# Patient Record
Sex: Female | Born: 1945 | Race: White | Hispanic: No | Marital: Married | Smoking: Never smoker
Health system: Southern US, Community
[De-identification: ages and names within clinical notes are randomized; demographics above are authoritative.]

## PROBLEM LIST (undated history)

## (undated) DIAGNOSIS — E049 Nontoxic goiter, unspecified: Secondary | ICD-10-CM

## (undated) DIAGNOSIS — D649 Anemia, unspecified: Secondary | ICD-10-CM

## (undated) DIAGNOSIS — N189 Chronic kidney disease, unspecified: Secondary | ICD-10-CM

## (undated) DIAGNOSIS — R0789 Other chest pain: Secondary | ICD-10-CM

## (undated) DIAGNOSIS — I10 Essential (primary) hypertension: Secondary | ICD-10-CM

## (undated) DIAGNOSIS — Z973 Presence of spectacles and contact lenses: Secondary | ICD-10-CM

## (undated) DIAGNOSIS — I639 Cerebral infarction, unspecified: Secondary | ICD-10-CM

## (undated) DIAGNOSIS — K209 Esophagitis, unspecified: Secondary | ICD-10-CM

## (undated) DIAGNOSIS — Z8719 Personal history of other diseases of the digestive system: Secondary | ICD-10-CM

## (undated) DIAGNOSIS — E785 Hyperlipidemia, unspecified: Secondary | ICD-10-CM

## (undated) DIAGNOSIS — M4316 Spondylolisthesis, lumbar region: Secondary | ICD-10-CM

## (undated) DIAGNOSIS — I739 Peripheral vascular disease, unspecified: Secondary | ICD-10-CM

## (undated) DIAGNOSIS — E039 Hypothyroidism, unspecified: Secondary | ICD-10-CM

## (undated) DIAGNOSIS — H5410 Blindness, one eye, low vision other eye, unspecified eyes: Secondary | ICD-10-CM

## (undated) DIAGNOSIS — Z87442 Personal history of urinary calculi: Secondary | ICD-10-CM

## (undated) DIAGNOSIS — Z87440 Personal history of urinary (tract) infections: Secondary | ICD-10-CM

## (undated) DIAGNOSIS — M109 Gout, unspecified: Secondary | ICD-10-CM

## (undated) DIAGNOSIS — E119 Type 2 diabetes mellitus without complications: Secondary | ICD-10-CM

## (undated) DIAGNOSIS — K219 Gastro-esophageal reflux disease without esophagitis: Secondary | ICD-10-CM

## (undated) DIAGNOSIS — G629 Polyneuropathy, unspecified: Secondary | ICD-10-CM

## (undated) HISTORY — PX: KNEE SURGERY: SHX244

## (undated) HISTORY — PX: TUMOR REMOVAL: SHX12

## (undated) HISTORY — PX: ABDOMINAL HYSTERECTOMY: SHX81

## (undated) HISTORY — PX: BREAST BIOPSY: SHX20

## (undated) HISTORY — DX: Essential (primary) hypertension: I10

## (undated) HISTORY — PX: TONSILLECTOMY AND ADENOIDECTOMY: SUR1326

## (undated) HISTORY — DX: Hypothyroidism, unspecified: E03.9

## (undated) HISTORY — PX: BUNIONECTOMY: SHX129

## (undated) HISTORY — DX: Gout, unspecified: M10.9

## (undated) HISTORY — PX: APPENDECTOMY: SHX54

## (undated) HISTORY — PX: TUBAL LIGATION: SHX77

## (undated) HISTORY — PX: BACK SURGERY: SHX140

## (undated) HISTORY — DX: Hyperlipidemia, unspecified: E78.5

## (undated) HISTORY — PX: TEMPORAL ARTERY BIOPSY / LIGATION: SUR132

## (undated) HISTORY — DX: Type 2 diabetes mellitus without complications: E11.9

## (undated) HISTORY — DX: Cerebral infarction, unspecified: I63.9

## (undated) HISTORY — DX: Gastro-esophageal reflux disease without esophagitis: K21.9

## (undated) HISTORY — DX: Esophagitis, unspecified: K20.9

## (undated) HISTORY — PX: DILATION AND CURETTAGE OF UTERUS: SHX78

## (undated) HISTORY — DX: Anemia, unspecified: D64.9

---

## 1993-07-08 HISTORY — PX: ABSCESS DRAINAGE: SHX1119

## 1999-02-08 ENCOUNTER — Other Ambulatory Visit: Admission: RE | Admit: 1999-02-08 | Discharge: 1999-02-08 | Payer: Self-pay | Admitting: *Deleted

## 2000-03-11 ENCOUNTER — Other Ambulatory Visit: Admission: RE | Admit: 2000-03-11 | Discharge: 2000-03-11 | Payer: Self-pay | Admitting: *Deleted

## 2000-03-26 ENCOUNTER — Ambulatory Visit (HOSPITAL_COMMUNITY): Admission: RE | Admit: 2000-03-26 | Discharge: 2000-03-26 | Payer: Self-pay | Admitting: Internal Medicine

## 2000-03-26 ENCOUNTER — Encounter: Payer: Self-pay | Admitting: Internal Medicine

## 2000-04-09 ENCOUNTER — Encounter: Payer: Self-pay | Admitting: Internal Medicine

## 2000-04-09 ENCOUNTER — Ambulatory Visit (HOSPITAL_COMMUNITY): Admission: RE | Admit: 2000-04-09 | Discharge: 2000-04-09 | Payer: Self-pay | Admitting: Internal Medicine

## 2001-10-12 ENCOUNTER — Ambulatory Visit (HOSPITAL_COMMUNITY): Admission: RE | Admit: 2001-10-12 | Discharge: 2001-10-12 | Payer: Self-pay | Admitting: Orthopedic Surgery

## 2001-10-12 ENCOUNTER — Encounter: Payer: Self-pay | Admitting: Orthopedic Surgery

## 2003-04-27 ENCOUNTER — Other Ambulatory Visit: Admission: RE | Admit: 2003-04-27 | Discharge: 2003-04-27 | Payer: Self-pay | Admitting: *Deleted

## 2007-05-27 ENCOUNTER — Ambulatory Visit: Payer: Self-pay | Admitting: Vascular Surgery

## 2007-09-17 ENCOUNTER — Encounter: Payer: Self-pay | Admitting: Cardiovascular Disease

## 2010-04-03 ENCOUNTER — Ambulatory Visit: Payer: Self-pay | Admitting: Cardiovascular Disease

## 2010-04-10 ENCOUNTER — Telehealth (INDEPENDENT_AMBULATORY_CARE_PROVIDER_SITE_OTHER): Payer: Self-pay | Admitting: *Deleted

## 2010-04-11 ENCOUNTER — Encounter: Payer: Self-pay | Admitting: Cardiovascular Disease

## 2010-04-11 ENCOUNTER — Encounter (HOSPITAL_COMMUNITY): Admission: RE | Admit: 2010-04-11 | Discharge: 2010-04-23 | Payer: Self-pay | Admitting: Cardiovascular Disease

## 2010-04-11 ENCOUNTER — Ambulatory Visit: Payer: Self-pay | Admitting: Cardiovascular Disease

## 2010-04-11 ENCOUNTER — Ambulatory Visit: Payer: Self-pay

## 2010-07-25 DIAGNOSIS — E1142 Type 2 diabetes mellitus with diabetic polyneuropathy: Secondary | ICD-10-CM | POA: Insufficient documentation

## 2010-08-07 NOTE — Assessment & Plan Note (Signed)
Summary: Cardiology Nuclear Testing  Nuclear Med Background Indications for Stress Test: Evaluation for Ischemia   History: Myocardial Perfusion Study  History Comments: 09 MPS nl  Symptoms: Dizziness, DOE, Palpitations  Symptoms Comments: Neck pain and radiates to arms   Nuclear Pre-Procedure Cardiac Risk Factors: Hypertension, Lipids, NIDDM Caffeine/Decaff Intake: None NPO After: 7:00 PM Lungs: clear IV 0.9% NS with Angio Cath: 22g     IV Site: R Hand IV Started by: Irean Hong, RN Chest Size (in) 36     Cup Size B     Height (in): 63 Weight (lb): 174 BMI: 30.93 Tech Comments: Held atenolol 36 hrs.ago.  Nuclear Med Study 1 or 2 day study:  1 day     Stress Test Type:  Stress Reading MD:  Kristeen Miss, MD     Referring MD:  P.Nahser Resting Radionuclide:  Technetium 48m Tetrofosmin     Resting Radionuclide Dose:  11 mCi  Stress Radionuclide:  Technetium 28m Tetrofosmin     Stress Radionuclide Dose:  33 mCi   Stress Protocol Exercise Time (min):  6:00 min     Max HR:  157 bpm     Predicted Max HR:  154 bpm  Max Systolic BP: 221 mm Hg     Percent Max HR:  101.95 %     METS: 7.0 Rate Pressure Product:  54098    Stress Test Technologist:  Milana Na, EMT-P     Nuclear Technologist:  Domenic Polite, CNMT  Rest Procedure  Myocardial perfusion imaging was performed at rest 45 minutes following the intravenous administration of Technetium 63m Tetrofosmin.  Stress Procedure  The patient exercised for 6:00. The patient stopped due to fatigue, sob,and denied any chest pain.  There were + significant ST-T wave changes and a rare pvc.  Technetium 61m Tetrofosmin was injected at peak exercise and myocardial perfusion imaging was performed after a brief delay.  QPS Raw Data Images:  Normal; no motion artifact; normal heart/lung ratio. Stress Images:  Normal homogeneous uptake in all areas of the myocardium. Rest Images:  Normal homogeneous uptake in all areas of the  myocardium. Subtraction (SDS):  No evidence of ischemia. Transient Ischemic Dilatation:  1.03  (Normal <1.22)  Lung/Heart Ratio:  .43  (Normal <0.45)  Quantitative Gated Spect Images QGS EDV:  68 ml QGS ESV:  16 ml QGS EF:  77 % QGS cine images:  Normal LV function.   Overall Impression  Exercise Capacity: Good exercise capacity. BP Response: Hypertensive blood pressure response. Clinical Symptoms: No chest pain ECG Impression: No significant ST segment change suggestive of ischemia. Overall Impression: Normal stress nuclear study. Overall Impression Comments: No evidence of ischemia. Normal LV function.

## 2010-08-07 NOTE — Progress Notes (Signed)
Summary: nuc pre procedure  Phone Note Outgoing Call Call back at Home Phone [phone]   Call placed by: Cathlyn Parsons RN,  April 10, 2010 2:22 PM Call placed to: Patient Reason for Call: Confirm/change Appt Summary of Call: Reviewed information on Myoview Information Sheet (see scanned document for further details).  Spoke with patient.      Nuclear Med Background Indications for Stress Test: Evaluation for Ischemia   History: Myocardial Perfusion Study  History Comments: 09 MPS nl   Symptoms Comments: Neck pain and radiates to arms   Nuclear Pre-Procedure Cardiac Risk Factors: Hypertension, Lipids, NIDDM

## 2010-08-14 ENCOUNTER — Encounter: Payer: Self-pay | Admitting: Cardiovascular Disease

## 2010-08-14 DIAGNOSIS — E119 Type 2 diabetes mellitus without complications: Secondary | ICD-10-CM | POA: Insufficient documentation

## 2010-08-14 DIAGNOSIS — E785 Hyperlipidemia, unspecified: Secondary | ICD-10-CM | POA: Insufficient documentation

## 2010-08-14 DIAGNOSIS — K219 Gastro-esophageal reflux disease without esophagitis: Secondary | ICD-10-CM | POA: Insufficient documentation

## 2010-08-14 DIAGNOSIS — E039 Hypothyroidism, unspecified: Secondary | ICD-10-CM | POA: Insufficient documentation

## 2010-08-14 DIAGNOSIS — I10 Essential (primary) hypertension: Secondary | ICD-10-CM | POA: Insufficient documentation

## 2010-10-02 ENCOUNTER — Encounter: Payer: Self-pay | Admitting: Cardiovascular Disease

## 2010-10-02 ENCOUNTER — Ambulatory Visit (INDEPENDENT_AMBULATORY_CARE_PROVIDER_SITE_OTHER): Payer: Federal, State, Local not specified - PPO | Admitting: Cardiovascular Disease

## 2010-10-02 DIAGNOSIS — I10 Essential (primary) hypertension: Secondary | ICD-10-CM

## 2010-10-02 DIAGNOSIS — R079 Chest pain, unspecified: Secondary | ICD-10-CM

## 2010-10-02 NOTE — Assessment & Plan Note (Signed)
Her chest pains were very atypical and have completely resolved. She had a normal stress Myoview study. I've recommended that she continue to exercise as much is possible. She will continue to follow up with Dr. Jacky Kindle for hypercholesterolemia and hypertension. I have not scheduled her for a return appointment but would be happy to see her if needed.

## 2010-10-02 NOTE — Progress Notes (Signed)
History of Present Illness: Theresa Shannon is a middle-aged female with history of atypical chest pains. She has a history of diabetes mellitus, hypothyroidism, and hypertension. She has a strong family history of coronary disease. She's feeling quite a bit better since I last saw her in September. She had a day stress Myoview study which was normal. She had no evidence of ischemia and she had normal left ventricular systolic function. She's done quite well since that time. She denies any syncope or presyncope.  Current Outpatient Prescriptions on File Prior to Visit  Medication Sig Dispense Refill  . aspirin 325 MG tablet Take 325 mg by mouth daily.        Marland Kitchen atenolol (TENORMIN) 50 MG tablet Take 50 mg by mouth daily.        . Calcium Carbonate-Vitamin D (CALCIUM 600 + D PO) Take by mouth daily.        . cycloSPORINE (RESTASIS) 0.05 % ophthalmic emulsion 1 drop 2 (two) times daily.        . DHA-EPA-Flaxseed Oil-Vitamin E (THERA TEARS NUTRITION PO) Take by mouth daily.        Marland Kitchen levothyroxine (SYNTHROID, LEVOTHROID) 200 MCG tablet Take 200 mcg by mouth daily.        Marland Kitchen omeprazole (PRILOSEC) 20 MG capsule Take 20 mg by mouth daily.        . Pyridoxine HCl (VITAMIN B-6) 100 MG tablet Take 100 mg by mouth daily.        Marland Kitchen triamterene-hydrochlorothiazide (MAXZIDE-25) 37.5-25 MG per tablet Take 1 tablet by mouth daily.          Allergies  Allergen Reactions  . Oxycodone-Acetaminophen     REACTION: Syncope  . Tylox     Past Medical History  Diagnosis Date  . HTN (hypertension)   . Hyperlipemia   . Diabetes mellitus, type 2   . Hypothyroidism   . GERD (gastroesophageal reflux disease)     Past Surgical History  Procedure Date  . Tonsillectomy and adenoidectomy   . Abdominal hysterectomy   . Knee surgery     BILATERAL  . Bunionectomy     LEFT  . Tumor removal     LEFT ARM  . Temporal artery biopsy / ligation   . Breast biopsy     History  Smoking status  . Never Smoker   Smokeless  tobacco  . Not on file    History  Alcohol Use No    Family History  Problem Relation Age of Onset  . Dementia Father   . Heart disease Father   . Hypertension Father   . Stroke Father   . Dementia Mother   . Kidney disease Mother   . Cancer Mother   . Diabetes Mother     Review of Systems: The patient denies any heat or cold intolerance.  No weight gain or weight loss.  The patient denies headaches or blurry vision.  There is no cough or sputum production.  The patient denies dizziness.  There is no hematuria or hematochezia.  The patient denies any muscle aches or arthritis.  The patient denies any rash.  The patient denies frequent falling or instability.  There is no history of depression or anxiety.  All other systems were reviewed and are negative.  Physical Exam: BP 136/72  Pulse 72  Wt 183 lb (83.008 kg) The patient is alert and oriented x 3.  The mood and affect are normal.  The skin is warm and dry.  Color  is normal.  The HEENT exam reveals that the sclera are nonicteric.  The mucous membranes are moist.  The carotids are 2+ without bruits.  There is no thyromegaly.  There is no JVD.  The lungs are clear.  The chest wall is non tender.  The heart exam reveals a regular rate with a normal S1 and S2.  There are no murmurs, gallops, or rubs.  The PMI is not displaced.   Abdominal exam reveals good bowel sounds.  There is no guarding or rebound.  There is no hepatosplenomegaly or tenderness.  There are no masses.  Exam of the legs reveal no clubbing, cyanosis, or edema.  The legs are without rashes.  The distal pulses are intact.  Cranial nerves II - XII are intact.  Motor and sensory functions are intact.  The gait is normal.  Assessment / Plan:

## 2010-10-02 NOTE — Assessment & Plan Note (Signed)
Her blood pressure is well controlled. She'll followup with Dr. Jacky Kindle.

## 2010-10-30 ENCOUNTER — Encounter: Payer: Self-pay | Admitting: Cardiovascular Disease

## 2010-11-20 NOTE — Procedures (Signed)
CAROTID DUPLEX EXAM   INDICATION:  Right retinal artery occlusion.   HISTORY:  Diabetes:  No.  Cardiac:  No.  Hypertension:  Yes.  Smoking:  No.  Previous Surgery:  No.  CV History:  No.  Amaurosis Fugax No, Paresthesias No, Hemiparesis No                                       RIGHT             LEFT  Brachial systolic pressure:         130               140  Brachial Doppler waveforms:         Biphasic          Biphasic  Vertebral direction of flow:        Antegrade         Antegrade  DUPLEX VELOCITIES (cm/sec)  CCA peak systolic                   120               119  ECA peak systolic                   105               106  ICA peak systolic                   57                69  ICA end diastolic                   24                32  PLAQUE MORPHOLOGY:                  None              None  PLAQUE AMOUNT:                      None              None  PLAQUE LOCATION:                    None              None   IMPRESSION:  Normal carotid duplex noted in bilateral ICAs.  Antegrade  bilateral vertebral arteries.   ___________________________________________  Larina Earthly, M.D.   MG/MEDQ  D:  05/27/2007  T:  05/28/2007  Job:  [id number]

## 2012-01-13 ENCOUNTER — Ambulatory Visit
Admission: RE | Admit: 2012-01-13 | Discharge: 2012-01-13 | Disposition: A | Discharge status: Home / Self Care | Payer: Federal, State, Local not specified - PPO | Source: Ambulatory Visit | Attending: Internal Medicine | Admitting: Internal Medicine

## 2012-01-13 ENCOUNTER — Other Ambulatory Visit: Payer: Self-pay | Admitting: Internal Medicine

## 2012-01-13 DIAGNOSIS — N611 Abscess of the breast and nipple: Secondary | ICD-10-CM

## 2012-01-13 DIAGNOSIS — Z803 Family history of malignant neoplasm of breast: Secondary | ICD-10-CM

## 2012-01-16 LAB — CULTURE, ROUTINE-ABSCESS

## 2012-01-27 ENCOUNTER — Ambulatory Visit
Admission: RE | Admit: 2012-01-27 | Discharge: 2012-01-27 | Disposition: A | Discharge status: Home / Self Care | Payer: Federal, State, Local not specified - PPO | Source: Ambulatory Visit | Attending: Internal Medicine | Admitting: Internal Medicine

## 2012-01-27 ENCOUNTER — Encounter (INDEPENDENT_AMBULATORY_CARE_PROVIDER_SITE_OTHER): Payer: Self-pay | Admitting: Surgery

## 2012-01-27 ENCOUNTER — Ambulatory Visit (INDEPENDENT_AMBULATORY_CARE_PROVIDER_SITE_OTHER): Payer: Federal, State, Local not specified - PPO | Admitting: Surgery

## 2012-01-27 VITALS — BP 110/82 | HR 60 | Temp 97.7°F | Resp 12 | Ht 63.0 in | Wt 191.0 lb

## 2012-01-27 DIAGNOSIS — N611 Abscess of the breast and nipple: Secondary | ICD-10-CM

## 2012-01-27 DIAGNOSIS — L723 Sebaceous cyst: Secondary | ICD-10-CM

## 2012-01-27 DIAGNOSIS — L089 Local infection of the skin and subcutaneous tissue, unspecified: Secondary | ICD-10-CM

## 2012-01-27 NOTE — Progress Notes (Signed)
Subjective:     Patient ID: Theresa Shannon, female   DOB: June 28, 1946, 66 y.o.   MRN: [id number]  HPI This is a 66 year old female who presents referred by Dr. 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 She actually had incision and drainage of a chronic sebaceous cyst in the same area approximately 18 years ago by Dr. Maple Hudson. This recently recurred in early July. She had an aspiration performed by radiology and has been on antibiotics but the area has recurred or persisted  Review of Systems     Objective:   Physical Exam On exam, she has an infected sebaceous cyst along the intermammary fold of the medial left breast. I prepped the area Betadine, anesthetized with lidocaine, made an incision with a scalpel and drained the sebaceous debris which was infected   I then packed the wound with gauze  an and Assessment:     Infected sebaceous cyst left breast    Plan:     Wound care instructions were given. I renewed her doxycycline. I will see her back in one week

## 2012-02-05 ENCOUNTER — Ambulatory Visit (INDEPENDENT_AMBULATORY_CARE_PROVIDER_SITE_OTHER): Payer: Federal, State, Local not specified - PPO | Admitting: Surgery

## 2012-02-05 ENCOUNTER — Encounter (INDEPENDENT_AMBULATORY_CARE_PROVIDER_SITE_OTHER): Payer: Self-pay | Admitting: Surgery

## 2012-02-05 VITALS — BP 129/78 | HR 72 | Temp 98.6°F | Resp 14 | Ht 63.0 in | Wt 188.6 lb

## 2012-02-05 DIAGNOSIS — Z09 Encounter for follow-up examination after completed treatment for conditions other than malignant neoplasm: Secondary | ICD-10-CM

## 2012-02-05 NOTE — Progress Notes (Signed)
Subjective:     Patient ID: Theresa Shannon, female   DOB: 1945/10/11, 66 y.o.   MRN: [id number]  HPI She is here for a followup visit status post incision and drainage of infected sebaceous cyst on her breast. She reports that she is doing well and has no complaints.  Review of Systems     Objective:   Physical Exam On exam, the incision the left breast is well-healed.    Assessment:     Status post incision of infected sebaceous cyst of the right breast    Plan:      As this is now her second infection of a sebaceous cyst the left breast, for removal of the area is recommended. She is eager to proceed with this. I discussed the risks of surgery with her. Surgery will be scheduled.

## 2012-02-14 ENCOUNTER — Encounter (HOSPITAL_BASED_OUTPATIENT_CLINIC_OR_DEPARTMENT_OTHER): Payer: Self-pay | Admitting: *Deleted

## 2012-02-14 NOTE — Progress Notes (Signed)
Pt going on [place]  trip-did hx early To come in for ekg-bmet when she gets back

## 2012-02-24 ENCOUNTER — Other Ambulatory Visit: Payer: Self-pay

## 2012-02-24 ENCOUNTER — Encounter (HOSPITAL_BASED_OUTPATIENT_CLINIC_OR_DEPARTMENT_OTHER)
Admission: RE | Admit: 2012-02-24 | Discharge: 2012-02-24 | Disposition: A | Discharge status: Home / Self Care | Payer: Federal, State, Local not specified - PPO | Source: Ambulatory Visit | Attending: Surgery | Admitting: Surgery

## 2012-02-24 LAB — BASIC METABOLIC PANEL
Chloride: 103 mEq/L (ref 96–112)
GFR calc Af Amer: 65 mL/min — ABNORMAL LOW (ref >90)
Potassium: 3.7 mEq/L (ref 3.5–5.1)
Sodium: 140 mEq/L (ref 135–145)

## 2012-02-24 NOTE — Progress Notes (Signed)
Dr fitzgerald reviewed ekg cleared

## 2012-02-26 ENCOUNTER — Encounter (HOSPITAL_BASED_OUTPATIENT_CLINIC_OR_DEPARTMENT_OTHER): Payer: Self-pay | Admitting: Anesthesiology

## 2012-02-26 ENCOUNTER — Encounter (HOSPITAL_BASED_OUTPATIENT_CLINIC_OR_DEPARTMENT_OTHER)
Admission: RE | Disposition: A | Discharge status: Home / Self Care | Payer: Self-pay | Source: Ambulatory Visit | Attending: Surgery

## 2012-02-26 ENCOUNTER — Ambulatory Visit (HOSPITAL_BASED_OUTPATIENT_CLINIC_OR_DEPARTMENT_OTHER): Payer: Federal, State, Local not specified - PPO | Admitting: Anesthesiology

## 2012-02-26 ENCOUNTER — Encounter (HOSPITAL_BASED_OUTPATIENT_CLINIC_OR_DEPARTMENT_OTHER): Payer: Self-pay | Admitting: *Deleted

## 2012-02-26 ENCOUNTER — Ambulatory Visit (HOSPITAL_BASED_OUTPATIENT_CLINIC_OR_DEPARTMENT_OTHER)
Admission: RE | Admit: 2012-02-26 | Discharge: 2012-02-26 | Disposition: A | Discharge status: Home / Self Care | Payer: Federal, State, Local not specified - PPO | Source: Ambulatory Visit | Attending: Surgery | Admitting: Surgery

## 2012-02-26 DIAGNOSIS — E039 Hypothyroidism, unspecified: Secondary | ICD-10-CM | POA: Insufficient documentation

## 2012-02-26 DIAGNOSIS — E119 Type 2 diabetes mellitus without complications: Secondary | ICD-10-CM | POA: Insufficient documentation

## 2012-02-26 DIAGNOSIS — Z01810 Encounter for preprocedural cardiovascular examination: Secondary | ICD-10-CM | POA: Insufficient documentation

## 2012-02-26 DIAGNOSIS — L723 Sebaceous cyst: Secondary | ICD-10-CM

## 2012-02-26 DIAGNOSIS — N6089 Other benign mammary dysplasias of unspecified breast: Secondary | ICD-10-CM | POA: Insufficient documentation

## 2012-02-26 DIAGNOSIS — Z01812 Encounter for preprocedural laboratory examination: Secondary | ICD-10-CM | POA: Insufficient documentation

## 2012-02-26 DIAGNOSIS — K219 Gastro-esophageal reflux disease without esophagitis: Secondary | ICD-10-CM | POA: Insufficient documentation

## 2012-02-26 DIAGNOSIS — I10 Essential (primary) hypertension: Secondary | ICD-10-CM | POA: Insufficient documentation

## 2012-02-26 HISTORY — DX: Blindness, one eye, low vision other eye, unspecified eyes: H54.10

## 2012-02-26 HISTORY — PX: BREAST CYST EXCISION: SHX579

## 2012-02-26 HISTORY — DX: Presence of spectacles and contact lenses: Z97.3

## 2012-02-26 HISTORY — DX: Other chest pain: R07.89

## 2012-02-26 LAB — GLUCOSE, CAPILLARY
Glucose-Capillary: 141 mg/dL — ABNORMAL HIGH (ref 70–99)
Glucose-Capillary: 147 mg/dL — ABNORMAL HIGH (ref 70–99)

## 2012-02-26 LAB — POCT HEMOGLOBIN-HEMACUE: Hemoglobin: 11.6 g/dL — ABNORMAL LOW (ref 12.0–15.0)

## 2012-02-26 SURGERY — EXCISION, CYST, BREAST
Anesthesia: Monitor Anesthesia Care | Site: Breast | Laterality: Left | Wound class: Clean

## 2012-02-26 MED ORDER — LIDOCAINE HCL (CARDIAC) 20 MG/ML IV SOLN
INTRAVENOUS | Status: DC | PRN
  Administered 2012-02-26: 50 mg via INTRAVENOUS

## 2012-02-26 MED ORDER — CEFAZOLIN SODIUM-DEXTROSE 2-3 GM-% IV SOLR
2.0000 g | INTRAVENOUS | Status: AC
  Administered 2012-02-26: 2 g via INTRAVENOUS

## 2012-02-26 MED ORDER — SODIUM CHLORIDE 0.9 % IV SOLN: 250.0000 mL | INTRAVENOUS | Status: DC | PRN

## 2012-02-26 MED ORDER — FENTANYL CITRATE 0.05 MG/ML IJ SOLN
INTRAMUSCULAR | Status: DC | PRN
  Administered 2012-02-26 (×2): 50 ug via INTRAVENOUS

## 2012-02-26 MED ORDER — LACTATED RINGERS IV SOLN
INTRAVENOUS | Status: DC
  Administered 2012-02-26: 10:00:00 via INTRAVENOUS

## 2012-02-26 MED ORDER — ONDANSETRON HCL 4 MG/2ML IJ SOLN
INTRAMUSCULAR | Status: DC | PRN
  Administered 2012-02-26: 4 mg via INTRAVENOUS

## 2012-02-26 MED ORDER — SODIUM CHLORIDE 0.9 % IJ SOLN: 3.0000 mL | INTRAMUSCULAR | Status: DC | PRN

## 2012-02-26 MED ORDER — PROPOFOL 10 MG/ML IV EMUL
INTRAVENOUS | Status: DC | PRN
  Administered 2012-02-26: 100 ug/kg/min via INTRAVENOUS

## 2012-02-26 MED ORDER — MEPERIDINE HCL 25 MG/ML IJ SOLN: 6.2500 mg | INTRAMUSCULAR | Status: DC | PRN

## 2012-02-26 MED ORDER — MORPHINE SULFATE 2 MG/ML IJ SOLN: 2.0000 mg | INTRAMUSCULAR | Status: DC | PRN

## 2012-02-26 MED ORDER — DEXAMETHASONE SODIUM PHOSPHATE 10 MG/ML IJ SOLN
INTRAMUSCULAR | Status: DC | PRN
  Administered 2012-02-26: 5 mg via INTRAVENOUS

## 2012-02-26 MED ORDER — ONDANSETRON HCL 4 MG/2ML IJ SOLN: 4.0000 mg | Freq: Four times a day (QID) | INTRAMUSCULAR | Status: DC | PRN

## 2012-02-26 MED ORDER — PROMETHAZINE HCL 25 MG/ML IJ SOLN: 6.2500 mg | INTRAMUSCULAR | Status: DC | PRN

## 2012-02-26 MED ORDER — MIDAZOLAM HCL 5 MG/5ML IJ SOLN
INTRAMUSCULAR | Status: DC | PRN
  Administered 2012-02-26: 2 mg via INTRAVENOUS

## 2012-02-26 MED ORDER — SODIUM CHLORIDE 0.9 % IJ SOLN: 3.0000 mL | Freq: Two times a day (BID) | INTRAMUSCULAR | Status: DC

## 2012-02-26 MED ORDER — LIDOCAINE HCL (PF) 1 % IJ SOLN
INTRAMUSCULAR | Status: DC | PRN
  Administered 2012-02-26: 10 mL

## 2012-02-26 MED ORDER — MIDAZOLAM HCL 2 MG/2ML IJ SOLN: 0.5000 mg | Freq: Once | INTRAMUSCULAR | Status: DC | PRN

## 2012-02-26 MED ORDER — HYDROCODONE-ACETAMINOPHEN 5-325 MG PO TABS: 1.0000 | ORAL_TABLET | ORAL | Status: AC | PRN

## 2012-02-26 MED ORDER — ACETAMINOPHEN 650 MG RE SUPP: 650.0000 mg | RECTAL | Status: DC | PRN

## 2012-02-26 MED ORDER — FENTANYL CITRATE 0.05 MG/ML IJ SOLN: 25.0000 ug | INTRAMUSCULAR | Status: DC | PRN

## 2012-02-26 MED ORDER — KETOROLAC TROMETHAMINE 30 MG/ML IJ SOLN
INTRAMUSCULAR | Status: DC | PRN
  Administered 2012-02-26: 30 mg via INTRAVENOUS

## 2012-02-26 MED ORDER — ACETAMINOPHEN 325 MG PO TABS: 650.0000 mg | ORAL_TABLET | ORAL | Status: DC | PRN

## 2012-02-26 SURGICAL SUPPLY — 43 items
BENZOIN TINCTURE PRP APPL 2/3 (GAUZE/BANDAGES/DRESSINGS) IMPLANT
BLADE HEX COATED 2.75 (ELECTRODE) ×2 IMPLANT
BLADE SURG 15 STRL LF DISP TIS (BLADE) ×1 IMPLANT
BLADE SURG 15 STRL SS (BLADE) ×1
CANISTER SUCTION 1200CC (MISCELLANEOUS) IMPLANT
CHLORAPREP W/TINT 26ML (MISCELLANEOUS) ×2 IMPLANT
CLIP TI WIDE RED SMALL 6 (CLIP) IMPLANT
CLOTH BEACON ORANGE TIMEOUT ST (SAFETY) ×2 IMPLANT
COVER MAYO STAND STRL (DRAPES) ×2 IMPLANT
COVER TABLE BACK 60X90 (DRAPES) ×2 IMPLANT
DECANTER SPIKE VIAL GLASS SM (MISCELLANEOUS) IMPLANT
DERMABOND ADVANCED (GAUZE/BANDAGES/DRESSINGS) ×1
DERMABOND ADVANCED .7 DNX12 (GAUZE/BANDAGES/DRESSINGS) ×1 IMPLANT
DEVICE DUBIN W/COMP PLATE 8390 (MISCELLANEOUS) IMPLANT
DRAPE PED LAPAROTOMY (DRAPES) ×2 IMPLANT
DRAPE UTILITY XL STRL (DRAPES) ×2 IMPLANT
DRSG TEGADERM 4X4.75 (GAUZE/BANDAGES/DRESSINGS) ×2 IMPLANT
ELECT REM PT RETURN 9FT ADLT (ELECTROSURGICAL) ×2
ELECTRODE REM PT RTRN 9FT ADLT (ELECTROSURGICAL) ×1 IMPLANT
GAUZE SPONGE 4X4 12PLY STRL LF (GAUZE/BANDAGES/DRESSINGS) ×2 IMPLANT
GLOVE BIO SURGEON STRL SZ7 (GLOVE) ×2 IMPLANT
GLOVE SURG SIGNA 7.5 PF LTX (GLOVE) ×2 IMPLANT
GOWN PREVENTION PLUS XLARGE (GOWN DISPOSABLE) IMPLANT
GOWN PREVENTION PLUS XXLARGE (GOWN DISPOSABLE) ×4 IMPLANT
KIT MARKER MARGIN INK (KITS) IMPLANT
NEEDLE HYPO 25X1 1.5 SAFETY (NEEDLE) ×2 IMPLANT
NS IRRIG 1000ML POUR BTL (IV SOLUTION) ×2 IMPLANT
PACK BASIN DAY SURGERY FS (CUSTOM PROCEDURE TRAY) ×2 IMPLANT
PENCIL BUTTON HOLSTER BLD 10FT (ELECTRODE) ×2 IMPLANT
SLEEVE SCD COMPRESS KNEE MED (MISCELLANEOUS) IMPLANT
SPONGE GAUZE 4X4 12PLY (GAUZE/BANDAGES/DRESSINGS) ×2 IMPLANT
SPONGE LAP 4X18 X RAY DECT (DISPOSABLE) ×2 IMPLANT
STRIP CLOSURE SKIN 1/2X4 (GAUZE/BANDAGES/DRESSINGS) ×2 IMPLANT
SUT MNCRL AB 4-0 PS2 18 (SUTURE) ×2 IMPLANT
SUT SILK 2 0 SH (SUTURE) IMPLANT
SUT VIC AB 3-0 SH 27 (SUTURE) ×1
SUT VIC AB 3-0 SH 27X BRD (SUTURE) ×1 IMPLANT
SYR CONTROL 10ML LL (SYRINGE) ×2 IMPLANT
TOWEL OR 17X24 6PK STRL BLUE (TOWEL DISPOSABLE) ×2 IMPLANT
TOWEL OR NON WOVEN STRL DISP B (DISPOSABLE) IMPLANT
TUBE CONNECTING 20X1/4 (TUBING) IMPLANT
WATER STERILE IRR 1000ML POUR (IV SOLUTION) IMPLANT
YANKAUER SUCT BULB TIP NO VENT (SUCTIONS) IMPLANT

## 2012-02-26 NOTE — Op Note (Signed)
CYST EXCISION BREAST  Procedure Note  Theresa Shannon 02/26/2012   Pre-op Diagnosis: chronic cyst left breast     Post-op Diagnosis: same  Procedure(s): 4 cm wide excision Left breast cyst  Surgeon(s): Shelly Rubenstein, MD  Anesthesia: Monitor Anesthesia Care  Staff:  Chrystine Oiler, RN - Circulator Macie Burows Ricks, RN - Scrub Person  Estimated Blood Loss: Minimal               Specimen:  Sent to path         Procedure: The patient was brought to the operating room and identified the correct patient. She was placed supine on the operating room table and anesthesia was induced. The chronic draining cyst was located at the 7:00 position along the inframammary crease. I anesthetized the skin with lidocaine. I made a 4 cm elliptical incision around the chronic cyst with a scalpel and took this down to the breast tissue with a scalpel. I then completed a wide excision with the electrocautery. The specimen was removed in its entirety and sent to pathology for evaluation. Hemostasis was achieved with the cautery. Anesthetized when further with lidocaine. I irrigated with saline. I then closed subcutaneous tissue with interrupted 3-0 Vicryl's and closed the skin with running 4-0 Monocryl. Dermabond, Steri-Strips, gauze, and Tegaderm were then applied.  The patient tolerated the procedure well. All counts were correct at the end of the procedure. The patient was then extubated in the operating room and taken in a stable condition to the recovery room. Kristine Tiley A   Date: 02/26/2012  Time: 10:01 AM

## 2012-02-26 NOTE — Transfer of Care (Signed)
Immediate Anesthesia Transfer of Care Note  Patient: Theresa Shannon  Procedure(s) Performed: Procedure(s) (LRB): CYST EXCISION BREAST (Left)  Patient Location: PACU  Anesthesia Type: MAC  Level of Consciousness: awake and alert   Airway & Oxygen Therapy: Patient Spontanous Breathing and Patient connected to face mask oxygen  Post-op Assessment: Report given to PACU RN and Post -op Vital signs reviewed and stable  Post vital signs: Reviewed and stable  Complications: No apparent anesthesia complications

## 2012-02-26 NOTE — Anesthesia Procedure Notes (Signed)
Procedure Name: MAC Date/Time: 02/26/2012 9:40 AM Performed by: Caren Macadam Pre-anesthesia Checklist: Patient identified, Emergency Drugs available, Suction available and Patient being monitored Patient Re-evaluated:Patient Re-evaluated prior to inductionOxygen Delivery Method: Simple face mask Preoxygenation: Pre-oxygenation with 100% oxygen Intubation Type: IV induction

## 2012-02-26 NOTE — Anesthesia Postprocedure Evaluation (Signed)
  Anesthesia Post-op Note  Patient: Theresa Shannon  Procedure(s) Performed: Procedure(s) (LRB): CYST EXCISION BREAST (Left)  Patient Location: PACU  Anesthesia Type: MAC  Level of Consciousness: awake, alert  and oriented  Airway and Oxygen Therapy: Patient Spontanous Breathing  Post-op Pain: none  Post-op Assessment: Post-op Vital signs reviewed, Patient's Cardiovascular Status Stable, Respiratory Function Stable, Patent Airway, No signs of Nausea or vomiting and Pain level controlled  Post-op Vital Signs: Reviewed and stable  Complications: No apparent anesthesia complications

## 2012-02-26 NOTE — Anesthesia Preprocedure Evaluation (Addendum)
Anesthesia Evaluation  Patient identified by MRN, date of birth, ID band Patient awake    Reviewed: Allergy & Precautions, H&P , NPO status , Patient's Chart, lab work & pertinent test results  History of Anesthesia Complications Negative for: history of anesthetic complications  Airway Mallampati: II TM Distance: >3 FB Neck ROM: Full    Dental No notable dental hx. (+) Teeth Intact and Dental Advisory Given   Pulmonary neg pulmonary ROS,  breath sounds clear to auscultation  Pulmonary exam normal       Cardiovascular hypertension, Pt. on medications and Pt. on home beta blockers Rhythm:Regular Rate:Normal  '12 stress myoview: no ischemia, normal LVF   Neuro/Psych negative neurological ROS     GI/Hepatic Neg liver ROS, GERD-  Medicated and Controlled,  Endo/Other  Well Controlled, Type 2Hypothyroidism (on replacement) Morbid obesity  Renal/GU negative Renal ROS     Musculoskeletal   Abdominal (+) + obese,   Peds  Hematology   Anesthesia Other Findings   Reproductive/Obstetrics                           Anesthesia Physical Anesthesia Plan  ASA: II  Anesthesia Plan: MAC   Post-op Pain Management:    Induction: Intravenous  Airway Management Planned: Simple Face Mask  Additional Equipment:   Intra-op Plan:   Post-operative Plan:   Informed Consent: I have reviewed the patients History and Physical, chart, labs and discussed the procedure including the risks, benefits and alternatives for the proposed anesthesia with the patient or authorized representative who has indicated his/her understanding and acceptance.   Dental advisory given  Plan Discussed with: CRNA and Surgeon  Anesthesia Plan Comments: (Plan routine monitors, MAC)        Anesthesia Quick Evaluation

## 2012-02-26 NOTE — Interval H&P Note (Signed)
History and Physical Interval Note:  No change  02/26/2012 9:08 AM  Theresa Shannon  has presented today for surgery, with the diagnosis of chronic cyst left breast  The various methods of treatment have been discussed with the patient and family. After consideration of risks, benefits and other options for treatment, the patient has consented to  Procedure(s) (LRB): CYST EXCISION BREAST (Left) as a surgical intervention .  The patient's history has been reviewed, patient examined, no change in status, stable for surgery.  I have reviewed the patient's chart and labs.  Questions were answered to the patient's satisfaction.     Harman Ferrin A

## 2012-02-26 NOTE — H&P (Signed)
Theresa Shannon is an 66 y.o. female.   Chief Complaint: chronic left breast cyst HPI: 66 yr old female with chronic left breast sebaceous cyst s/p infections and I and D twice.  Now presents for definitive excision  Past Medical History  Diagnosis Date  . HTN (hypertension)   . Hyperlipemia   . Hypothyroidism   . GERD (gastroesophageal reflux disease)   . Anemia   . 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 Diabetes mellitus, type 2     diet controlled  . Wears glasses   . Atypical chest pain     stress test 1/12-normal    Past Surgical History  Procedure Date  . Tonsillectomy and adenoidectomy   . Abdominal hysterectomy   . Knee surgery     BILATERAL  . Bunionectomy     LEFT  . Tumor removal     LEFT ARM-lipoma  . Temporal artery biopsy / ligation   . Abscess drainage 1995    breast  . Breast biopsy     Family History  Problem Relation Age of Onset  . Dementia Father   . Heart disease Father   . Hypertension Father   . Stroke Father   . Dementia Mother   . Kidney disease Mother   . Cancer Mother   . Diabetes Mother    Social History:  reports that she has never smoked. She does not have any smokeless tobacco history on file. She reports that she does not drink alcohol or use illicit drugs.  Allergies:  Allergies  Allergen Reactions  . Oxycodone-Acetaminophen     REACTION: Syncope  . Oxycodone-Acetaminophen     No prescriptions prior to admission    Results for orders placed during the hospital encounter of 02/26/12 (from the past 48 hour(s))  BASIC METABOLIC PANEL     Status: Abnormal   Collection Time   02/24/12 10:30 AM      Component Value Range Comment   Sodium 140  135 - 145 mEq/L    Potassium 3.7  3.5 - 5.1 mEq/L    Chloride 103  96 - 112 mEq/L    CO2 25  19 - 32 mEq/L    Glucose, Bld 158 (*) 70 - 99 mg/dL    BUN 23  6 - 23 mg/dL    Creatinine, Ser 1.32  0.50 - 1.10 mg/dL    Calcium 9.5  8.4 -  10.5 mg/dL    GFR calc non Af Amer 56 (*) >90 mL/min    GFR calc Af Amer 65 (*) >90 mL/min    No results found.  Review of Systems  All other systems reviewed and are negative.    Height 5\' 3"  (1.6 m), weight 188 lb (85.276 kg). Physical Exam  Constitutional: She is oriented to person, place, and time. She appears well-developed and well-nourished. No distress.  HENT:  Head: Normocephalic and atraumatic.  Right Ear: External ear normal.  Left Ear: External ear normal.  Nose: Nose normal.  Mouth/Throat: Oropharynx is clear and moist.  Eyes: Conjunctivae are normal. Pupils are equal, round, and reactive to light. No scleral icterus.  Neck: Normal range of motion. Neck supple.  Cardiovascular: Normal rate, regular rhythm, normal heart sounds and intact distal pulses.   No murmur heard. Respiratory: Effort normal and breath sounds normal. No respiratory distress. She has no wheezes.  Neurological: She is alert  and oriented to person, place, and time.  Skin: Skin is warm and dry.  Psychiatric: Her behavior is normal. Judgment normal.   breast:  Scar and cyst left breast approx 7 o'clock.  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  Risks of surgery include but are not limited to bleeding, infection, injury, need for further surgery, recurrence, etc.  She agrees to proceed.  Likelihood of success is good  Quantae Martel A 02/26/2012, 6:23 AM

## 2012-03-03 ENCOUNTER — Encounter (HOSPITAL_BASED_OUTPATIENT_CLINIC_OR_DEPARTMENT_OTHER): Payer: Self-pay | Admitting: Surgery

## 2012-03-11 ENCOUNTER — Encounter (INDEPENDENT_AMBULATORY_CARE_PROVIDER_SITE_OTHER): Payer: Self-pay | Admitting: Surgery

## 2012-03-11 ENCOUNTER — Ambulatory Visit (INDEPENDENT_AMBULATORY_CARE_PROVIDER_SITE_OTHER): Payer: Federal, State, Local not specified - PPO | Admitting: Surgery

## 2012-03-11 VITALS — BP 146/70 | HR 60 | Temp 98.2°F | Ht 63.0 in | Wt 188.6 lb

## 2012-03-11 DIAGNOSIS — Z09 Encounter for follow-up examination after completed treatment for conditions other than malignant neoplasm: Secondary | ICD-10-CM

## 2012-03-11 NOTE — Progress Notes (Signed)
Subjective:     Patient ID: Theresa Shannon, female   DOB: April 21, 1946, 66 y.o.   MRN: [id number]  HPI She is here for her first postoperative visit status post excision of a chronic left breast cyst She is doing well and has no complaints Review of Systems     Objective:   Physical Exam    the final pathology showed a chronic cyst with no evidence of malignancy Assessment:     Patient stable status post excision of chronic left breast cyst    Plan:     She may return to normal activity. I will see her back as needed

## 2012-06-11 ENCOUNTER — Other Ambulatory Visit: Payer: Self-pay | Admitting: Dermatology

## 2013-03-22 ENCOUNTER — Other Ambulatory Visit: Payer: Self-pay | Admitting: Nurse Practitioner

## 2013-04-12 ENCOUNTER — Other Ambulatory Visit: Payer: Self-pay | Admitting: Gastroenterology

## 2013-04-29 ENCOUNTER — Encounter: Payer: Self-pay | Admitting: Nurse Practitioner

## 2013-04-29 ENCOUNTER — Ambulatory Visit (INDEPENDENT_AMBULATORY_CARE_PROVIDER_SITE_OTHER): Payer: Federal, State, Local not specified - PPO | Admitting: Nurse Practitioner

## 2013-04-29 VITALS — BP 142/80 | HR 54 | Ht 63.5 in | Wt 190.0 lb

## 2013-04-29 DIAGNOSIS — R51 Headache: Secondary | ICD-10-CM

## 2013-04-29 DIAGNOSIS — I10 Essential (primary) hypertension: Secondary | ICD-10-CM

## 2013-04-29 MED ORDER — ATENOLOL 50 MG PO TABS: 50.0000 mg | ORAL_TABLET | Freq: Every day | ORAL | Status: DC

## 2013-04-29 NOTE — Patient Instructions (Signed)
Please continue atenolol for headaches  Will renew Rx for one year  Followup yearly and when necessary  

## 2013-04-29 NOTE — Progress Notes (Signed)
[facility]  PATIENT: Theresa Shannon DOB: [birth year]   REASON FOR VISIT: follow up for headache   HISTORY OF PRESENT ILLNESS: Ms. Theresa Shannon, 67 year old female returns for followup She has rare headaches. She is well controlled on atenolol. She has easy bruisability due to aspirin, loss of vision in the right due to her retinal occlusion as stated above,  thyroid disorder currently controlled with medications.  She is exercising by walking and on a diabetic diet. She needs refills.    HISTORY:Ms Theresa Shannon is a 67 year old returns today for followup. She has been evaluated for headaches in the past, she has several different types of headaches. Her headaches date back to January of 2009. 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  She underwent a temporal artery biopsy, began having headaches at that time. She has a second type of headache that is more of a stabbing pain which can occur either in the right temporal area or around the right eye. She is unable to identify any precipitating factors,  she has no associated redness of the eye,  tearing of the eye or running of the nose.  Valproic has not been helpful. She has not had any problems with daytime drowsiness and feels that she has restorative sleep. She has been on Lyrica in the past but did not find it to be beneficial. She has  elevated blood pressures in the past and was started on atenolol,  when last seen her blood pressure continued to be in the 150 range systolically and  atenolol increased to 50 daily.      REVIEW OF SYSTEMS: Full 14 system review of systems performed and notable only for:  Constitutional: N/A  Cardiovascular: N/A  Ear/Nose/Throat: N/A  Skin: N/A  Eyes: N/A  Respiratory: N/A  Gastroitestinal: N/A  Hematology/Lymphatic: N/A  Endocrine: N/A Musculoskeletal:N/A  Allergy/Immunology: N/A    Neurological: N/A Psychiatric: N/A   ALLERGIES: Allergies  Allergen Reactions  . Oxycodone-Acetaminophen     REACTION: Syncope  . Oxycodone-Acetaminophen     HOME MEDICATIONS: Outpatient Prescriptions Prior to Visit  Medication Sig Dispense Refill  . aspirin 325 MG tablet Take 325 mg by mouth daily.        Marland Kitchen atenolol (TENORMIN) 50 MG tablet take 1 tablet by mouth once daily  30 tablet  1  . Calcium Carbonate-Vitamin D (CALCIUM 600 + D PO) Take by mouth daily.        . cycloSPORINE (RESTASIS) 0.05 % ophthalmic emulsion 1 drop 2 (two) times daily.        . DHA-EPA-Flaxseed Oil-Vitamin E (THERA TEARS NUTRITION PO) Take by mouth daily.        Marland Kitchen levothyroxine (SYNTHROID, LEVOTHROID) 200 MCG tablet Take 200 mcg by mouth daily.        Marland Kitchen omeprazole (PRILOSEC) 20 MG capsule Take 20 mg by mouth daily.        Marland Kitchen triamterene-hydrochlorothiazide (MAXZIDE-25) 37.5-25 MG per tablet Take 1 tablet by mouth daily.         No facility-administered medications prior to visit.    PAST MEDICAL HISTORY: Past Medical History  Diagnosis Date  . HTN (hypertension)   . Hyperlipemia   . Hypothyroidism   . GERD (gastroesophageal reflux disease)   . Anemia   . 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 Diabetes mellitus, type 2     diet controlled  . Wears glasses   . Atypical chest pain     stress test 1/12-normal    PAST SURGICAL HISTORY: Past Surgical History  Procedure Laterality Date  . Tonsillectomy and adenoidectomy    . Abdominal hysterectomy    . Knee surgery      BILATERAL  . Bunionectomy      LEFT  . Tumor removal      LEFT ARM-lipoma  . Temporal artery biopsy / ligation    . Abscess drainage  1995    breast  . Breast biopsy    . Breast cyst excision  02/26/2012    Procedure: CYST EXCISION BREAST;  Surgeon: Shelly Rubenstein, MD;  Location: [facility];  Service: General;  Laterality: Left;  excision chronic cyst left  breast    FAMILY HISTORY: Family History  Problem Relation Age of Onset  . Dementia Father   . Heart disease Father   . Hypertension Father   . Stroke Father   . Dementia Mother   . Kidney disease Mother   . Cancer Mother   . Diabetes Mother     SOCIAL HISTORY: History   Social History  . Marital Status: Married    Spouse Name: Theresa Shannon    Number of Children: 1  . Years of Education: 12th   Occupational History  . Retired    Social History Main Topics  . Smoking status: Never Smoker   . Smokeless tobacco: Never Used  . Alcohol Use: No  . Drug Use: No  . Sexual Activity: Not on file   Other Topics Concern  . Not on file   Social History Narrative   Patient lives at home with husband Theresa Shannon.    Patient has 1 child.    Patient is retired.    Patient has a high school education.      PHYSICAL EXAM  Filed Vitals:   04/29/13 1134  BP: 168/72  Pulse: 54  Height: 5' 3.5" (1.613 m)  Weight: 190 lb (86.183 kg)   Body mass index is 33.12 kg/(m^2).  Generalized: Well developed, in no acute distress    Neurological examination   Mentation: Alert oriented to time, place, history taking. Follows all commands speech and language fluent  Cranial nerve II-XII: Pupils were equal round reactive to light extraocular movements were full, visual field were full on confrontational test. Facial sensation and strength were normal. hearing was intact to finger rubbing bilaterally. Uvula tongue midline. head turning and shoulder shrug and were normal and symmetric.Tongue protrusion into cheek strength was normal. Motor: normal bulk and tone, full strength in the BUE, BLE, fine finger movements normal, no pronator drift. No focal weakness Coordination: finger-nose-finger, heel-to-shin bilaterally, no dysmetria Reflexes: 1+ upper lower and symmetric Gait and Station: Rising up from seated position without assistance, normal stance,  moderate stride, good arm swing, smooth turning,  able to perform tiptoe, and heel walking without difficulty.   DIAGNOSTIC DATA (LABS, IMAGING, TESTING) - None to review   ASSESSMENT AND PLAN  67 y.o. year old female  has a past medical history of HTN (hypertension); and headaches. Her headaches have responded well to atenolol. She has a prior history of retinal occlusion   Please continue atenolol for headaches Will renew Rx for one year Followup yearly and when necessary Nilda Riggs, [facility], [facility], APRN  [facility] [address], [address]  [place], [place] [address] [phone]

## 2014-01-27 ENCOUNTER — Other Ambulatory Visit: Payer: Self-pay | Admitting: Obstetrics & Gynecology

## 2014-01-31 LAB — CYTOLOGY - PAP

## 2014-02-09 ENCOUNTER — Encounter: Payer: Self-pay | Admitting: Nurse Practitioner

## 2014-04-29 ENCOUNTER — Ambulatory Visit: Payer: Federal, State, Local not specified - PPO | Admitting: Nurse Practitioner

## 2014-05-02 ENCOUNTER — Other Ambulatory Visit: Payer: Self-pay | Admitting: Nurse Practitioner

## 2014-05-02 ENCOUNTER — Ambulatory Visit: Payer: Federal, State, Local not specified - PPO | Admitting: Nurse Practitioner

## 2014-05-02 ENCOUNTER — Encounter: Payer: Self-pay | Admitting: Nurse Practitioner

## 2014-05-02 ENCOUNTER — Ambulatory Visit (INDEPENDENT_AMBULATORY_CARE_PROVIDER_SITE_OTHER): Payer: Federal, State, Local not specified - PPO | Admitting: Nurse Practitioner

## 2014-05-02 VITALS — BP 134/72 | HR 68 | Ht 63.5 in | Wt 194.2 lb

## 2014-05-02 DIAGNOSIS — R519 Headache, unspecified: Secondary | ICD-10-CM

## 2014-05-02 DIAGNOSIS — R51 Headache: Secondary | ICD-10-CM

## 2014-05-02 MED ORDER — ATENOLOL 50 MG PO TABS: 50.0000 mg | ORAL_TABLET | Freq: Every day | ORAL | Status: DC

## 2014-05-02 NOTE — Patient Instructions (Signed)
Please continue atenolol for headaches  Will renew Rx for one year  Followup yearly and when necessary

## 2014-05-02 NOTE — Progress Notes (Signed)
[facility]  PATIENT: Theresa Shannon DOB: 1945-11-01   REASON FOR VISIT: Follow-up for headache   HISTORY OF PRESENT ILLNESS:Theresa Shannon, 68 year old female returns for followup She has rare headaches. She is well controlled on atenolol. 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 She is exercising by walking and on a diabetic diet. She needs refills on her medication. She returns for reevaluation  HISTORY:Theresa Shannon is a 68 year old returns today for followup. She has been evaluated for headaches in the past, she has several different types of headaches. Her headaches date back to January of 2009. 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 She underwent a temporal artery biopsy, began having headaches at that time. She has a second type of headache that is more of a stabbing pain which can occur either in the right temporal area or around the right eye. She is unable to identify any precipitating factors, she has no associated redness of the eye, tearing of the eye or running of the nose. Valproic has not been helpful. She has not had any problems with daytime drowsiness and feels that she has restorative sleep. She has been on Lyrica in the past but did not find it to be beneficial. She has elevated blood pressures in the past and was started on atenolol, when last seen her blood pressure continued to be in the 638 range systolically and atenolol increased to 50 daily.      REVIEW OF SYSTEMS: Full 14 system review of systems performed and notable only for those listed, all others are neg:  Constitutional: N/A  Cardiovascular: N/A  Ear/Nose/Throat: N/A  Skin: N/A  Eyes: N/A  Respiratory: N/A  Gastroitestinal: N/A  Hematology/Lymphatic: N/A  Endocrine:  N/A Musculoskeletal:N/A  Allergy/Immunology: N/A  Neurological: N/A Psychiatric: N/A Sleep : NA   ALLERGIES: Allergies  Allergen Reactions  . Oxycodone-Acetaminophen     REACTION: Syncope  . Oxycodone-Acetaminophen     HOME MEDICATIONS: Outpatient Prescriptions Prior to Visit  Medication Sig Dispense Refill  . aspirin 325 MG tablet Take 325 mg by mouth daily.        Marland Kitchen atenolol (TENORMIN) 50 MG tablet TAKE 1 TABLET BY MOUTH DAILY  30 tablet  0  . Calcium Carbonate-Vitamin D (CALCIUM 600 + D PO) Take by mouth daily.        . cycloSPORINE (RESTASIS) 0.05 % ophthalmic emulsion 1 drop 2 (two) times daily.        . DHA-EPA-Flaxseed Oil-Vitamin E (THERA TEARS NUTRITION PO) Take by mouth daily.        Marland Kitchen levothyroxine (SYNTHROID, LEVOTHROID) 200 MCG tablet Take 200 mcg by mouth daily.        Marland Kitchen omeprazole (PRILOSEC) 20 MG capsule Take 20 mg by mouth daily.        Marland Kitchen triamterene-hydrochlorothiazide (MAXZIDE-25) 37.5-25 MG per tablet Take 1 tablet by mouth daily.         No facility-administered medications prior to visit.    PAST MEDICAL HISTORY: Past Medical History  Diagnosis Date  . HTN (hypertension)   . Hyperlipemia   . Hypothyroidism   . GERD (gastroesophageal reflux disease)   . Anemia   . 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 Blindness and low vision     right eye  post cva  . Diabetes mellitus, type 2     diet controlled  . Wears glasses   . Atypical chest pain     stress test 1/12-normal    PAST SURGICAL HISTORY: Past Surgical History  Procedure Laterality Date  . Tonsillectomy and adenoidectomy    . Abdominal hysterectomy    . Knee surgery      BILATERAL  . Bunionectomy      LEFT  . Tumor removal      LEFT ARM-lipoma  . Temporal artery biopsy / ligation    . Abscess drainage  1995    breast  . Breast biopsy    . Breast cyst excision  02/26/2012    Procedure: CYST EXCISION BREAST;  Surgeon: Harl Bowie, MD;  Location: [facility];  Service:  General;  Laterality: Left;  excision chronic cyst left breast    FAMILY HISTORY: Family History  Problem Relation Age of Onset  . Dementia Father   . Heart disease Father   . Hypertension Father   . Stroke Father   . Dementia Mother   . Kidney disease Mother   . Cancer Mother   . Diabetes Mother     SOCIAL HISTORY: History   Social History  . Marital Status: Married    Spouse Name: Elenore Rota    Number of Children: 1  . Years of Education: 12th   Occupational History  . Retired    Social History Main Topics  . Smoking status: Never Smoker   . Smokeless tobacco: Never Used  . Alcohol Use: No  . Drug Use: No  . Sexual Activity: Not on file   Other Topics Concern  . Not on file   Social History Narrative   Patient lives at home with husband Elenore Rota.    Patient has 1 child.    Patient is retired.    Patient has a high school education.      PHYSICAL EXAM  Filed Vitals:   05/02/14 1336  BP: 134/72  Pulse: 68  Height: 5' 3.5" (1.613 m)  Weight: 194 lb 3.2 oz (88.089 kg)   Body mass index is 33.86 kg/(m^2). Generalized: Well developed, in no acute distress  Neurological examination  Mentation: Alert oriented to time, place, history taking. Follows all commands speech and language fluent  Cranial nerve II-XII: Pupils were equal round reactive to light extraocular movements were full, visual field were full on confrontational test. Facial sensation and strength were normal. hearing was intact to finger rubbing bilaterally. Uvula tongue midline. head turning and shoulder shrug and were normal and symmetric.Tongue protrusion into cheek strength was normal.  Motor: normal bulk and tone, full strength in the BUE, BLE, fine finger movements normal, no pronator drift. No focal weakness  Coordination: finger-nose-finger, heel-to-shin bilaterally, no dysmetria  Reflexes: 1+ upper lower and symmetric  Gait and Station: Rising up from seated position without assistance, normal  stance, moderate stride, good arm swing, smooth turning, able to perform tiptoe, and heel walking without difficulty.       DIAGNOSTIC DATA (LABS, IMAGING, TESTING) -  ASSESSMENT AND PLAN  68 y.o. year old female  has a past medical history of HTN (hypertension); and headaches that have responded to atenolol.  Please continue atenolol for headaches  Will renew Rx for one year, given to patient per request.  Followup yearly and when necessary Dennie Bible, [facility], [facility], APRN  [facility] [address] [place], [place] [address] [phone]

## 2014-08-17 ENCOUNTER — Other Ambulatory Visit: Payer: Self-pay | Admitting: Dermatology

## 2015-03-09 DIAGNOSIS — K209 Esophagitis, unspecified without bleeding: Secondary | ICD-10-CM

## 2015-03-09 HISTORY — DX: Esophagitis, unspecified without bleeding: K20.90

## 2015-03-23 DIAGNOSIS — I129 Hypertensive chronic kidney disease with stage 1 through stage 4 chronic kidney disease, or unspecified chronic kidney disease: Secondary | ICD-10-CM | POA: Insufficient documentation

## 2015-03-23 DIAGNOSIS — E1121 Type 2 diabetes mellitus with diabetic nephropathy: Secondary | ICD-10-CM | POA: Insufficient documentation

## 2015-04-01 ENCOUNTER — Encounter (HOSPITAL_COMMUNITY): Payer: Self-pay

## 2015-04-01 ENCOUNTER — Emergency Department (HOSPITAL_COMMUNITY): Payer: Federal, State, Local not specified - PPO

## 2015-04-01 DIAGNOSIS — Z8673 Personal history of transient ischemic attack (TIA), and cerebral infarction without residual deficits: Secondary | ICD-10-CM | POA: Diagnosis not present

## 2015-04-01 DIAGNOSIS — R079 Chest pain, unspecified: Secondary | ICD-10-CM | POA: Diagnosis not present

## 2015-04-01 DIAGNOSIS — K219 Gastro-esophageal reflux disease without esophagitis: Secondary | ICD-10-CM | POA: Insufficient documentation

## 2015-04-01 DIAGNOSIS — E039 Hypothyroidism, unspecified: Secondary | ICD-10-CM | POA: Insufficient documentation

## 2015-04-01 DIAGNOSIS — Z862 Personal history of diseases of the blood and blood-forming organs and certain disorders involving the immune mechanism: Secondary | ICD-10-CM | POA: Diagnosis not present

## 2015-04-01 DIAGNOSIS — I10 Essential (primary) hypertension: Secondary | ICD-10-CM | POA: Insufficient documentation

## 2015-04-01 DIAGNOSIS — H5441 Blindness, right eye, normal vision left eye: Secondary | ICD-10-CM | POA: Insufficient documentation

## 2015-04-01 DIAGNOSIS — Z79899 Other long term (current) drug therapy: Secondary | ICD-10-CM | POA: Diagnosis not present

## 2015-04-01 DIAGNOSIS — E119 Type 2 diabetes mellitus without complications: Secondary | ICD-10-CM | POA: Diagnosis not present

## 2015-04-01 DIAGNOSIS — Z7982 Long term (current) use of aspirin: Secondary | ICD-10-CM | POA: Diagnosis not present

## 2015-04-01 LAB — BASIC METABOLIC PANEL
ANION GAP: 10 (ref 5–15)
BUN: 23 mg/dL — AB (ref 6–20)
CO2: 27 mmol/L (ref 22–32)
Calcium: 10 mg/dL (ref 8.9–10.3)
Chloride: 103 mmol/L (ref 101–111)
Creatinine, Ser: 1.35 mg/dL — ABNORMAL HIGH (ref 0.44–1.00)
GFR calc Af Amer: 45 mL/min — ABNORMAL LOW (ref >60)
GFR, EST NON AFRICAN AMERICAN: 38 mL/min — AB (ref >60)
GLUCOSE: 112 mg/dL — AB (ref 65–99)
POTASSIUM: 4 mmol/L (ref 3.5–5.1)
Sodium: 140 mmol/L (ref 135–145)

## 2015-04-01 LAB — CBC
HEMATOCRIT: 41.3 % (ref 36.0–46.0)
HEMOGLOBIN: 13.6 g/dL (ref 12.0–15.0)
MCH: 30 pg (ref 26.0–34.0)
MCHC: 32.9 g/dL (ref 30.0–36.0)
MCV: 91.2 fL (ref 78.0–100.0)
Platelets: 193 10*3/uL (ref 150–400)
RBC: 4.53 MIL/uL (ref 3.87–5.11)
RDW: 13.4 % (ref 11.5–15.5)
WBC: 6.8 10*3/uL (ref 4.0–10.5)

## 2015-04-01 LAB — I-STAT TROPONIN, ED: Troponin i, poc: 0.01 ng/mL (ref 0.00–0.08)

## 2015-04-01 NOTE — ED Notes (Signed)
Pt reports stabbing central chest pain onset around 1300 today while she was sitting at the computer. Denies SOB, dizziness. Pain worse with inspiration.

## 2015-04-02 ENCOUNTER — Encounter (HOSPITAL_COMMUNITY): Payer: Self-pay | Admitting: Radiology

## 2015-04-02 ENCOUNTER — Emergency Department (HOSPITAL_COMMUNITY): Payer: Federal, State, Local not specified - PPO

## 2015-04-02 ENCOUNTER — Emergency Department (HOSPITAL_COMMUNITY)
Admission: EM | Admit: 2015-04-02 | Discharge: 2015-04-02 | Disposition: A | Discharge status: Home / Self Care | Payer: Federal, State, Local not specified - PPO | Attending: Emergency Medicine | Admitting: Emergency Medicine

## 2015-04-02 DIAGNOSIS — R079 Chest pain, unspecified: Secondary | ICD-10-CM

## 2015-04-02 LAB — I-STAT TROPONIN, ED: Troponin i, poc: 0 ng/mL (ref 0.00–0.08)

## 2015-04-02 LAB — TROPONIN I

## 2015-04-02 MED ORDER — IOHEXOL 350 MG/ML SOLN
80.0000 mL | Freq: Once | INTRAVENOUS | Status: AC | PRN
  Administered 2015-04-02: 80 mL via INTRAVENOUS

## 2015-04-02 MED ORDER — SODIUM CHLORIDE 0.9 % IV BOLUS (SEPSIS)
1000.0000 mL | Freq: Once | INTRAVENOUS | Status: AC
  Administered 2015-04-02: 1000 mL via INTRAVENOUS

## 2015-04-02 NOTE — ED Provider Notes (Signed)
CSN: [id number]     Arrival date & time 04/01/15  2021 History  This chart was scribed for Theresa Balls, MD by Evelene Croon, ED Scribe. This patient was seen in room D34C/D34C and the patient's care was started 12:19 AM.     Chief Complaint  Patient presents with  . Chest Pain    The history is provided by the patient. No language interpreter was used.     HPI Comments:  Theresa Shannon is a 69 y.o. female with a history of HTN, HLD and DM, who presents to the Emergency Department complaining of intermittent stabbing central CP that ~ 1300 yesterday (03/31/2014). Her symptom started while seated at the computer. Pt notes her symptom is exacerbated with exertion and deep inspiration. She denies SOB, dizziness, diaphoresis and vomiting. No alleviating factors noted. She reports a history of blood clot in her right eye diagnosed in Oct 2008; denies recent travel/surgery and pain/swelling to her BLE. Pt takes ASA daily. She is asymptomatic at this time.   Past Medical History  Diagnosis Date  . HTN (hypertension)   . Hyperlipemia   . Hypothyroidism   . GERD (gastroesophageal reflux disease)   . Anemia   . 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 Diabetes mellitus, type 2     diet controlled  . Wears glasses   . Atypical chest pain     stress test 1/12-normal   Past Surgical History  Procedure Laterality Date  . Tonsillectomy and adenoidectomy    . Abdominal hysterectomy    . Knee surgery      BILATERAL  . Bunionectomy      LEFT  . Tumor removal      LEFT ARM-lipoma  . Temporal artery biopsy / ligation    . Abscess drainage  1995    breast  . Breast biopsy    . Breast cyst excision  02/26/2012    Procedure: CYST EXCISION BREAST;  Surgeon: Harl Bowie, MD;  Location: [facility];  Service: General;  Laterality: Left;  excision chronic cyst left breast   Family History  Problem Relation Age of Onset  . Dementia  Father   . Heart disease Father   . Hypertension Father   . Stroke Father   . Dementia Mother   . Kidney disease Mother   . Cancer Mother   . Diabetes Mother    Social History  Substance Use Topics  . Smoking status: Never Smoker   . Smokeless tobacco: Never Used  . Alcohol Use: No   OB History    No data available     Review of Systems  A complete 10 system review of systems was obtained and all systems are negative except as noted in the HPI and PMH.    Allergies  Oxycodone-acetaminophen  Home Medications   Prior to Admission medications   Medication Sig Start Date End Date Taking? Authorizing Provider  acetaminophen (TYLENOL) 325 MG tablet Take 650 mg by mouth every 6 (six) hours as needed for mild pain.   Yes Historical Provider, MD  aspirin 325 MG tablet Take 325 mg by mouth daily.     Yes Historical Provider, MD  atenolol (TENORMIN) 50 MG tablet Take 1 tablet (50 mg total) by mouth daily. 05/02/14  Yes Dennie Bible, NP  Calcium Carbonate-Vitamin D (CALCIUM 600 + D PO) Take 1 tablet  by mouth daily.    Yes Historical Provider, MD  cycloSPORINE (RESTASIS) 0.05 % ophthalmic emulsion Place 1 drop into both eyes 2 (two) times daily.    Yes Historical Provider, MD  levothyroxine (SYNTHROID, LEVOTHROID) 200 MCG tablet Take 200 mcg by mouth daily.     Yes Historical Provider, MD  omeprazole (PRILOSEC) 20 MG capsule Take 20 mg by mouth daily as needed (heartburn).    Yes Historical Provider, MD  triamterene-hydrochlorothiazide (MAXZIDE-25) 37.5-25 MG per tablet Take 1 tablet by mouth daily.     Yes Historical Provider, MD   BP 132/53 mmHg  Pulse 70  Temp(Src) 98.1 F (36.7 C) (Oral)  Resp 23  Ht 5\' 3"  (1.6 m)  Wt 183 lb (83.008 kg)  BMI 32.43 kg/m2  SpO2 98% Physical Exam  Constitutional: She is oriented to person, place, and time. She appears well-developed and well-nourished. No distress.  HENT:  Head: Normocephalic and atraumatic.  Nose: Nose normal.   Mouth/Throat: Oropharynx is clear and moist. No oropharyngeal exudate.  Eyes: Conjunctivae and EOM are normal. Pupils are equal, round, and reactive to light. No scleral icterus.  Neck: Normal range of motion. Neck supple. No JVD present. No tracheal deviation present. No thyromegaly present.  Cardiovascular: Normal rate, regular rhythm and normal heart sounds.  Exam reveals no gallop and no friction rub.   No murmur heard. Pulmonary/Chest: Effort normal and breath sounds normal. No respiratory distress. She has no wheezes. She exhibits no tenderness.  Abdominal: Soft. Bowel sounds are normal. She exhibits no distension and no mass. There is no tenderness. There is no rebound and no guarding.  Musculoskeletal: Normal range of motion. She exhibits no edema or tenderness.  Lymphadenopathy:    She has no cervical adenopathy.  Neurological: She is alert and oriented to person, place, and time. No cranial nerve deficit. She exhibits normal muscle tone.  Skin: Skin is warm and dry. No rash noted. No erythema. No pallor.  Nursing note and vitals reviewed.   ED Course  Procedures   DIAGNOSTIC STUDIES:  Oxygen Saturation is 98% on RA, normal by my interpretation.    COORDINATION OF CARE:  12:25 AM Discussed treatment plan with pt at bedside and pt agreed to plan.  Labs Review Labs Reviewed  BASIC METABOLIC PANEL - Abnormal; Notable for the following:    Glucose, Bld 112 (*)    BUN 23 (*)    Creatinine, Ser 1.35 (*)    GFR calc non Af Amer 38 (*)    GFR calc Af Amer 45 (*)    All other components within normal limits  CBC  TROPONIN I  I-STAT TROPOININ, ED  I-STAT TROPOININ, ED    Imaging Review Dg Chest 2 View  04/01/2015   CLINICAL DATA:  69 year old female with centralized chest pain that radiates to the right side of the body since earlier today.  EXAM: CHEST  2 VIEW  COMPARISON:  No priors.  FINDINGS: Lung volumes are normal. No consolidative airspace disease. No pleural  effusions. Mild diffuse peribronchial cuffing, most severe in the region of the right middle lobe medially. No evidence of pulmonary edema. Heart size is borderline enlarged. Upper mediastinal contours are within normal limits.  IMPRESSION: 1. Mild diffuse peribronchial cuffing, most severe in the right middle lobe, concerning for bronchitis.   Electronically Signed   By: Vinnie Langton M.D.   On: 04/01/2015 21:20   Ct Angio Chest Pe W/cm &/or Wo Cm  04/02/2015   CLINICAL DATA:  Shortness of breath. Dizziness. Stabbing chest pain since 1 p.m. today.  EXAM: CT ANGIOGRAPHY CHEST WITH CONTRAST  TECHNIQUE: Multidetector CT imaging of the chest was performed using the standard protocol during bolus administration of intravenous contrast. Multiplanar CT image reconstructions and MIPs were obtained to evaluate the vascular anatomy.  CONTRAST:  39mL OMNIPAQUE IOHEXOL 350 MG/ML SOLN  COMPARISON:  Chest radiographs obtained earlier today.  FINDINGS: Normally opacified pulmonary arteries. No pulmonary arterial filling defects seen. Patchy interstitial prominence in both lungs. There is also bilateral dependent atelectasis. Diffuse peribronchial thickening. No lung nodules or enlarged lymph nodes. Unremarkable upper abdomen. Thoracic spine degenerative changes.  Review of the MIP images confirms the above findings.  IMPRESSION: 1. No pulmonary emboli. 2. Bilateral interstitial pneumonitis. 3. Bronchitic changes. 4. Bilateral dependent atelectasis.   Electronically Signed   By: Claudie Revering M.D.   On: 04/02/2015 02:46   I have personally reviewed and evaluated these images and lab results as part of my medical decision-making.   EKG Interpretation   Date/Time:  Sunday April 02 2015 00:15:53 EDT Ventricular Rate:  58 PR Interval:  167 QRS Duration: 86 QT Interval:  421 QTC Calculation: 413 R Axis:   36 Text Interpretation:  Sinus rhythm No significant change since last  tracing Confirmed by Glynn Octave [phone]) on 04/02/2015 12:50:00  AM      MDM   Final diagnoses:  None    Patient presents emergency department for chest pain. Her pain is pleuritic, concerning for pulmonary embolism. She has history of blood clot in her eye in the past. Will obtain CT imaging for evaluation. EKG repeated 2 does not show any acute changes for ischemia. Repeat troponin is negative as well.    CT scan is negative for pulmonary embolism. It does reveal mild pneumonitis, these findings were explained to the patient. Primary care follow-up is advised in the next 2 days. She demonstrated understanding. She appears well and in no acute distress, she has had no recurrence of her symptoms. She is currently asymptomatic. Her vital signs remain within her normal limits and she is safe for discharge.  I personally performed the services described in this documentation, which was scribed in my presence. The recorded information has been reviewed and is accurate.    Theresa Balls, MD 04/02/15 [phone]

## 2015-04-02 NOTE — ED Notes (Signed)
Patient transported to CT 

## 2015-04-02 NOTE — Discharge Instructions (Signed)
Chest Pain (Nonspecific) Theresa Shannon, your CT scan results are below. See her primary care physician within 3 days for close follow-up. If symptoms worsen come back to emergency department immediately. Thank you.  FINDINGS: Normally opacified pulmonary arteries. No pulmonary arterial filling defects seen. Patchy interstitial prominence in both lungs. There is also bilateral dependent atelectasis. Diffuse peribronchial thickening. No lung nodules or enlarged lymph nodes. Unremarkable upper abdomen. Thoracic spine degenerative changes.  Review of the MIP images confirms the above findings.  IMPRESSION: 1. No pulmonary emboli. 2. Bilateral interstitial pneumonitis. 3. Bronchitic changes. 4. Bilateral dependent atelectasis.   It is often hard to give a diagnosis for the cause of chest pain. There is always a chance that your pain could be related to something serious, such as a heart attack or a blood clot in the lungs. You need to follow up with your doctor. HOME CARE  If antibiotic medicine was given, take it as directed by your doctor. Finish the medicine even if you start to feel better.  For the next few days, avoid activities that bring on chest pain. Continue physical activities as told by your doctor.  Do not use any tobacco products. This includes cigarettes, chewing tobacco, and e-cigarettes.  Avoid drinking alcohol.  Only take medicine as told by your doctor.  Follow your doctor's suggestions for more testing if your chest pain does not go away.  Keep all doctor visits you made. GET HELP IF:  Your chest pain does not go away, even after treatment.  You have a rash with blisters on your chest.  You have a fever. GET HELP RIGHT AWAY IF:   You have more pain or pain that spreads to your arm, neck, jaw, back, or belly (abdomen).  You have shortness of breath.  You cough more than usual or cough up blood.  You have very bad back or belly pain.  You feel sick to  your stomach (nauseous) or throw up (vomit).  You have very bad weakness.  You pass out (faint).  You have chills. This is an emergency. Do not wait to see if the problems will go away. Call your local emergency services (911 in [place] Do not drive yourself to the hospital. MAKE SURE YOU:   Understand these instructions.  Will watch your condition.  Will get help right away if you are not doing well or get worse. Document Released: 12/11/2007 Document Revised: 06/29/2013 Document Reviewed: 12/11/2007 [facility] Patient Information 2015 [place], [place]. This information is not intended to replace advice given to you by your health care provider. Make sure you discuss any questions you have with your health care provider.

## 2015-04-02 NOTE — ED Notes (Signed)
Dr. Oni at bedside. 

## 2015-04-08 DIAGNOSIS — M109 Gout, unspecified: Secondary | ICD-10-CM

## 2015-04-08 HISTORY — DX: Gout, unspecified: M10.9

## 2015-04-28 ENCOUNTER — Other Ambulatory Visit: Payer: Self-pay | Admitting: Gastroenterology

## 2015-05-03 ENCOUNTER — Encounter (INDEPENDENT_AMBULATORY_CARE_PROVIDER_SITE_OTHER): Payer: Self-pay

## 2015-05-03 ENCOUNTER — Ambulatory Visit (INDEPENDENT_AMBULATORY_CARE_PROVIDER_SITE_OTHER): Payer: Federal, State, Local not specified - PPO | Admitting: Nurse Practitioner

## 2015-05-03 ENCOUNTER — Encounter: Payer: Self-pay | Admitting: Nurse Practitioner

## 2015-05-03 VITALS — BP 147/72 | HR 66 | Ht 63.0 in | Wt 178.2 lb

## 2015-05-03 DIAGNOSIS — R519 Headache, unspecified: Secondary | ICD-10-CM

## 2015-05-03 DIAGNOSIS — R51 Headache: Secondary | ICD-10-CM

## 2015-05-03 MED ORDER — ATENOLOL 50 MG PO TABS: 50.0000 mg | ORAL_TABLET | Freq: Every day | ORAL | Status: DC

## 2015-05-03 NOTE — Patient Instructions (Addendum)
Continue atenolol 50 mg daily for headache management will refill for one year   follow-up yearly.

## 2015-05-03 NOTE — Progress Notes (Signed)
[facility]  PATIENT: Theresa Shannon DOB: 1946-01-09   REASON FOR VISIT: Follow-up for episodic  headaches HISTORY FROM: Patient    HISTORY OF PRESENT ILLNESS:Theresa Shannon, 69 year old female returns for followup She has episodic headaches. She is well controlled on atenolol. 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 She is exercising by walking and on a diabetic diet. She needs refills on her medication. She has had esophagitis and gout since last seen.She returns for reevaluation  HISTORY:Theresa Shannon is a 69 year old returns today for followup. She has been evaluated for headaches in the past, she has several different types of headaches. Her headaches date back to January of 2009. 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 She underwent a temporal artery biopsy, began having headaches at that time. She has a second type of headache that is more of a stabbing pain which can occur either in the right temporal area or around the right eye. She is unable to identify any precipitating factors, she has no associated redness of the eye, tearing of the eye or running of the nose. Valproic has not been helpful. She has not had any problems with daytime drowsiness and feels that she has restorative sleep. She has been on Lyrica in the past but did not find it to be beneficial. She has elevated blood pressures in the past and was started on atenolol, when last seen her blood pressure continued to be in the 967 range systolically and atenolol increased to 50 daily.     REVIEW OF SYSTEMS: Full 14 system review of systems performed and notable only for those listed, all others are neg:  Constitutional: neg  Cardiovascular: neg Ear/Nose/Throat: neg  Skin: neg Eyes:  neg Respiratory: neg Gastroitestinal: neg  Hematology/Lymphatic: neg  Endocrine: neg Musculoskeletal:neg Allergy/Immunology: neg Neurological: neg Psychiatric: neg Sleep : neg   ALLERGIES: Allergies  Allergen Reactions  . Oxycodone-Acetaminophen     REACTION: Syncope    HOME MEDICATIONS: Outpatient Prescriptions Prior to Visit  Medication Sig Dispense Refill  . acetaminophen (TYLENOL) 325 MG tablet Take 650 mg by mouth every 6 (six) hours as needed for mild pain.    Marland Kitchen aspirin 325 MG tablet Take 325 mg by mouth daily.      Marland Kitchen atenolol (TENORMIN) 50 MG tablet Take 1 tablet (50 mg total) by mouth daily. 30 tablet 11  . Calcium Carbonate-Vitamin D (CALCIUM 600 + D PO) Take 1 tablet by mouth daily.     . cycloSPORINE (RESTASIS) 0.05 % ophthalmic emulsion Place 1 drop into both eyes 2 (two) times daily.     Marland Kitchen levothyroxine (SYNTHROID, LEVOTHROID) 200 MCG tablet Take 200 mcg by mouth daily.      Marland Kitchen omeprazole (PRILOSEC) 20 MG capsule Take 20 mg by mouth daily as needed (heartburn).     . triamterene-hydrochlorothiazide (MAXZIDE-25) 37.5-25 MG per tablet Take 1 tablet by mouth daily.       No facility-administered medications prior to visit.    PAST MEDICAL HISTORY: Past Medical History  Diagnosis Date  . HTN (hypertension)   . Hyperlipemia   . Hypothyroidism   . GERD (gastroesophageal reflux disease)   . Anemia   . Stroke [facility])     in the eye only  . 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 Diabetes mellitus, type 2 (HCC)     diet controlled  . Wears glasses   . Atypical chest pain     stress test 1/12-normal    PAST SURGICAL HISTORY: Past Surgical History  Procedure Laterality Date  . Tonsillectomy and adenoidectomy    . Abdominal hysterectomy    . Knee surgery      BILATERAL  . Bunionectomy      LEFT  . Tumor removal      LEFT ARM-lipoma  . Temporal artery biopsy / ligation    . Abscess drainage  1995    breast  . Breast biopsy    . Breast cyst  excision  02/26/2012    Procedure: CYST EXCISION BREAST;  Surgeon: Harl Bowie, MD;  Location: [facility];  Service: General;  Laterality: Left;  excision chronic cyst left breast    FAMILY HISTORY: Family History  Problem Relation Age of Onset  . Dementia Father   . Heart disease Father   . Hypertension Father   . Stroke Father   . Dementia Mother   . Kidney disease Mother   . Cancer Mother   . Diabetes Mother     SOCIAL HISTORY: Social History   Social History  . Marital Status: Married    Spouse Name: Elenore Rota  . Number of Children: 1  . Years of Education: 12th   Occupational History  . Retired    Social History Main Topics  . Smoking status: Never Smoker   . Smokeless tobacco: Never Used  . Alcohol Use: No  . Drug Use: No  . Sexual Activity: Not on file   Other Topics Concern  . Not on file   Social History Narrative   Patient lives at home with husband Elenore Rota.    Patient has 1 child.    Patient is retired.    Patient has a high school education.      PHYSICAL EXAM  Filed Vitals:   05/03/15 1326  BP: 147/72  Pulse: 66  Height: 5\' 3"  (1.6 m)  Weight: 178 lb 3.2 oz (80.831 kg)   Body mass index is 31.57 kg/(m^2). Generalized: Well developed, in no acute distress  Neurological examination  Mentation: Alert oriented to time, place, history taking. Follows all commands speech and language fluent  Cranial nerve II-XII: Pupils were equal round reactive to light extraocular movements were full, visual field were full on confrontational test. Facial sensation and strength were normal. hearing was intact to finger rubbing bilaterally. Uvula tongue midline. head turning and shoulder shrug and were normal and symmetric.Tongue protrusion into cheek strength was normal.  Motor: normal bulk and tone, full strength in the BUE, BLE, fine finger movements normal, no pronator drift. No focal weakness  Coordination: finger-nose-finger,  heel-to-shin bilaterally, no dysmetria  Reflexes: 1+ upper lower and symmetric  Gait and Station: Rising up from seated position without assistance, normal stance, moderate stride, good arm swing, smooth turning, able to perform tiptoe, and heel walking without difficulty.    DIAGNOSTIC DATA (LABS, IMAGING, TESTING) - I reviewed patient records, labs, notes, testing and imaging myself where available.  Lab Results  Component Value Date   WBC 6.8 04/01/2015   HGB 13.6 04/01/2015   HCT 41.3 04/01/2015   MCV 91.2 04/01/2015   PLT 193 04/01/2015      Component Value Date/Time   NA 140 04/01/2015 2050   K 4.0 04/01/2015 2050   CL 103 04/01/2015 2050  CO2 27 04/01/2015 2050   GLUCOSE 112* 04/01/2015 2050   BUN 23* 04/01/2015 2050   CREATININE 1.35* 04/01/2015 2050   CALCIUM 10.0 04/01/2015 2050   GFRNONAA 29* 04/01/2015 2050   GFRAA 45* 04/01/2015 2050    ASSESSMENT AND PLAN  69 y.o. year old female  has a past medical history of HTN (hypertension); Hyperlipemia; Hypothyroidism; GERD (gastroesophageal reflux disease); Anemia;  Blindness and low vision; Diabetes mellitus, type 2 ([place]); and Atypical chest pain here to follow-up. Her headaches are well controlled  Continue atenolol 50 mg daily for headache management will refill for one year  Follow-up yearly. Dennie Bible, [facility], [facility] APRN  [facility] [address] [place], [place] [address] [phone]

## 2015-05-08 NOTE — Progress Notes (Signed)
I have reviewed and agreed above plan. 

## 2015-06-13 ENCOUNTER — Other Ambulatory Visit (HOSPITAL_COMMUNITY): Payer: Self-pay | Admitting: Gastroenterology

## 2015-06-13 DIAGNOSIS — R1013 Epigastric pain: Secondary | ICD-10-CM

## 2015-06-21 ENCOUNTER — Ambulatory Visit (HOSPITAL_COMMUNITY)
Admission: RE | Admit: 2015-06-21 | Discharge: 2015-06-21 | Disposition: A | Discharge status: Home / Self Care | Payer: Federal, State, Local not specified - PPO | Source: Ambulatory Visit | Attending: Gastroenterology | Admitting: Gastroenterology

## 2015-06-21 DIAGNOSIS — R1013 Epigastric pain: Secondary | ICD-10-CM | POA: Insufficient documentation

## 2015-06-21 MED ORDER — TECHNETIUM TC 99M MEBROFENIN IV KIT
5.3000 | PACK | Freq: Once | INTRAVENOUS | Status: AC | PRN
  Administered 2015-06-21: 5 via INTRAVENOUS

## 2015-07-18 ENCOUNTER — Other Ambulatory Visit: Payer: Self-pay | Admitting: Gastroenterology

## 2015-07-18 DIAGNOSIS — R1011 Right upper quadrant pain: Secondary | ICD-10-CM

## 2015-07-20 ENCOUNTER — Other Ambulatory Visit: Payer: Medicare Other

## 2015-07-26 ENCOUNTER — Ambulatory Visit
Admission: RE | Admit: 2015-07-26 | Discharge: 2015-07-26 | Disposition: A | Discharge status: Home / Self Care | Payer: Federal, State, Local not specified - PPO | Source: Ambulatory Visit | Attending: Gastroenterology | Admitting: Gastroenterology

## 2015-07-26 DIAGNOSIS — R1011 Right upper quadrant pain: Secondary | ICD-10-CM

## 2015-07-26 IMAGING — US US ABDOMEN COMPLETE
1 series · 14 of 25 positions shown · non-contrast
Comparison: [DATE]

CLINICAL DATA: Right upper quadrant abdominal pain.

EXAM:
ABDOMEN ULTRASOUND COMPLETE

[Series 1: us abdomen complete · 0.24mm/px · 14 of 68 slices shown]
[im 1/68]
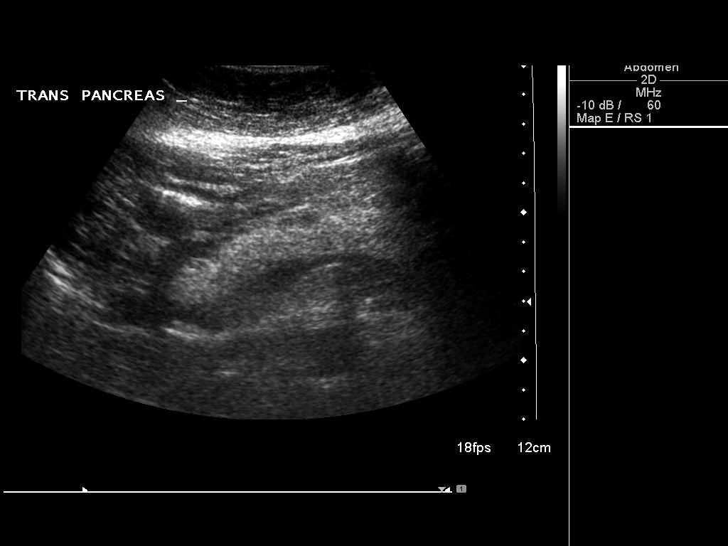
[im 6/68]
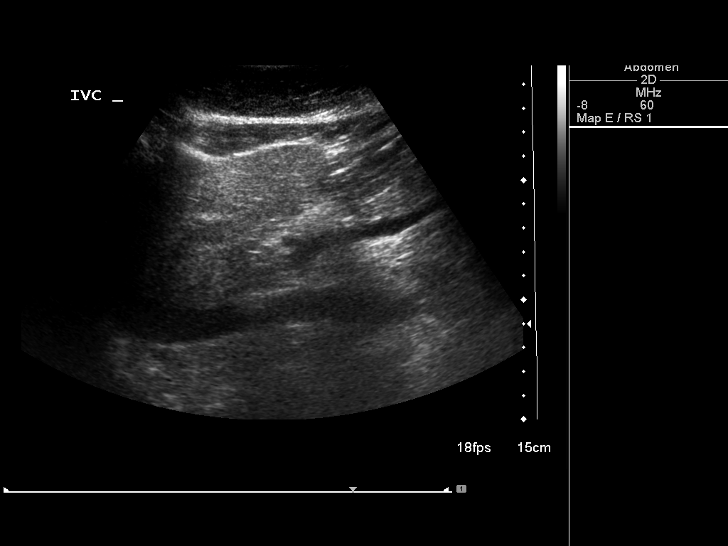
[im 12/68]
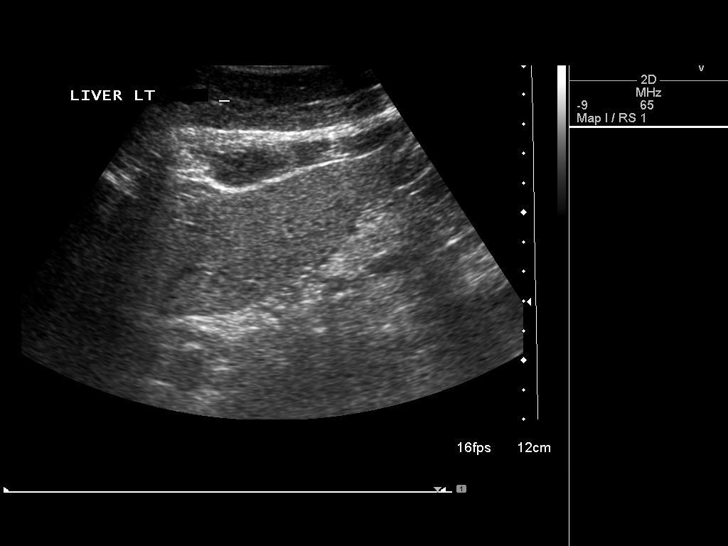
[im 17/68]
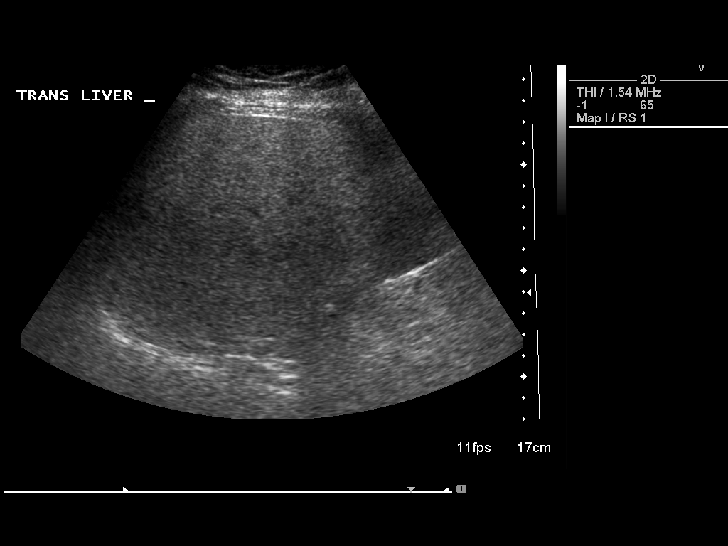
[im 23/68]
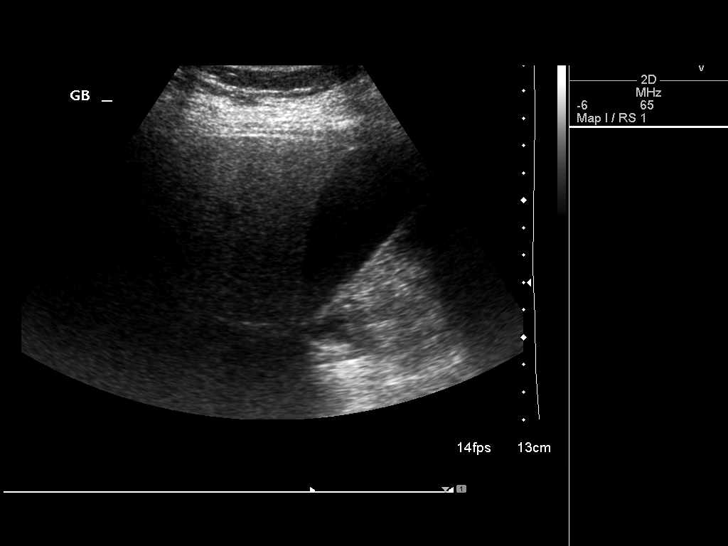
[im 26/68]
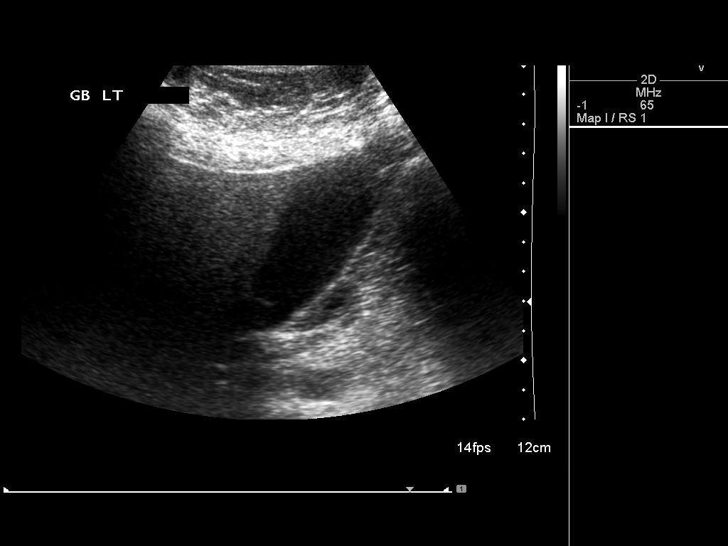
[im 31/68]
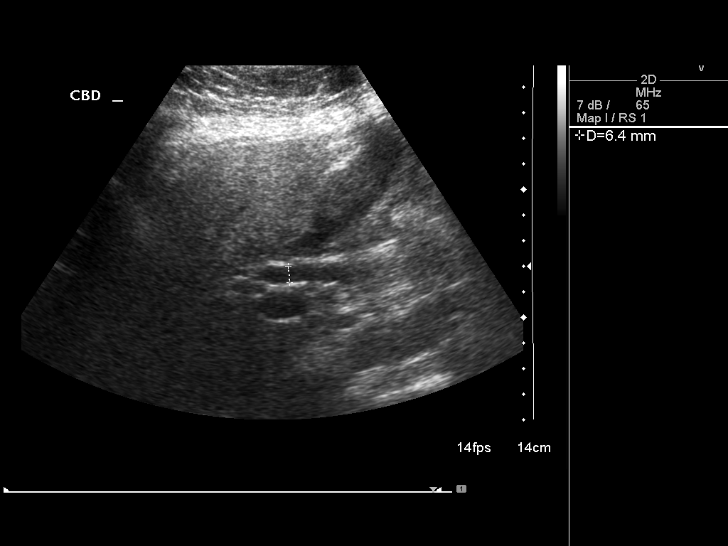
[im 37/68]
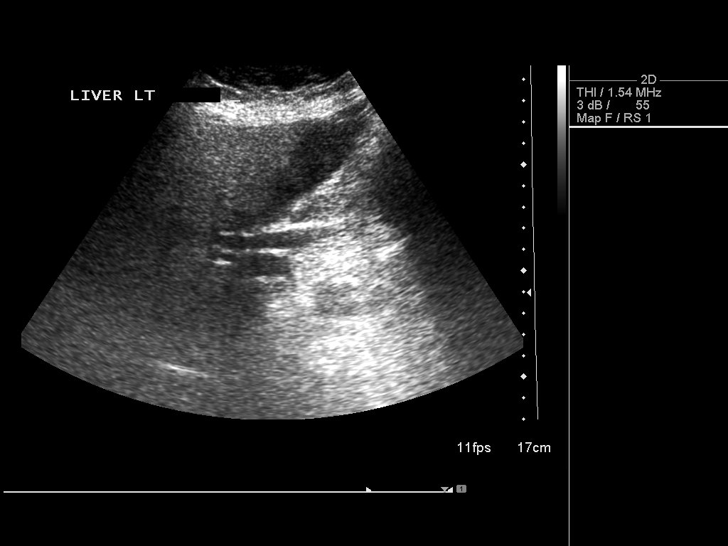
[im 42/68]
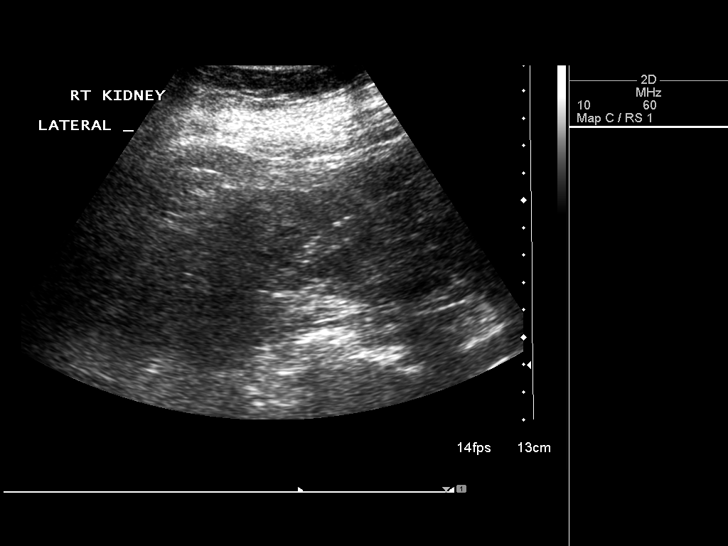
[im 45/68]
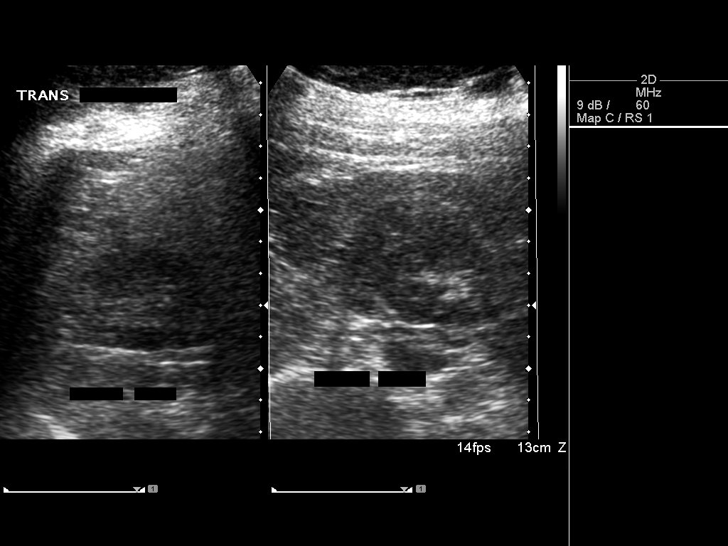
[im 51/68]
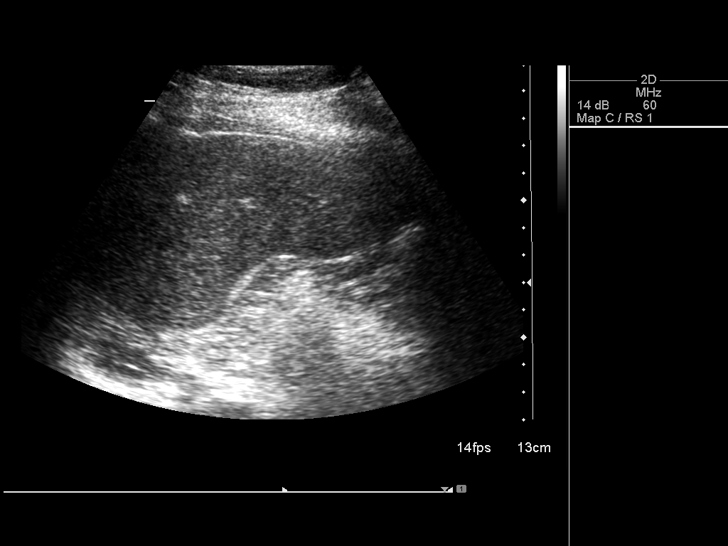
[im 56/68]
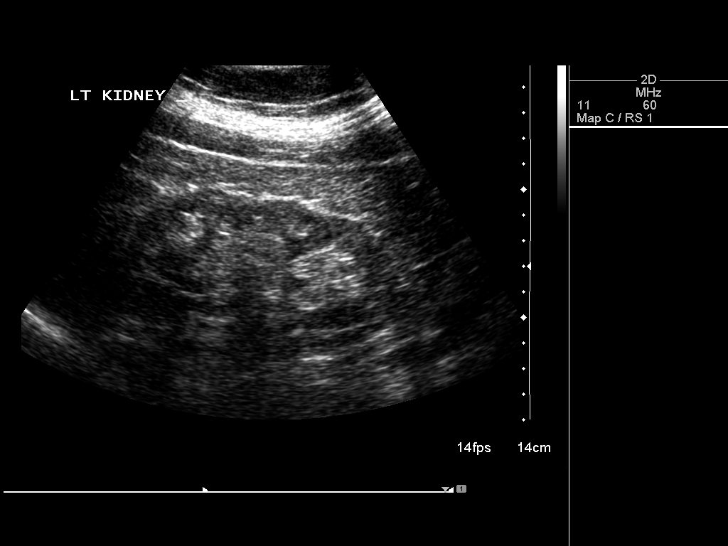
[im 62/68]
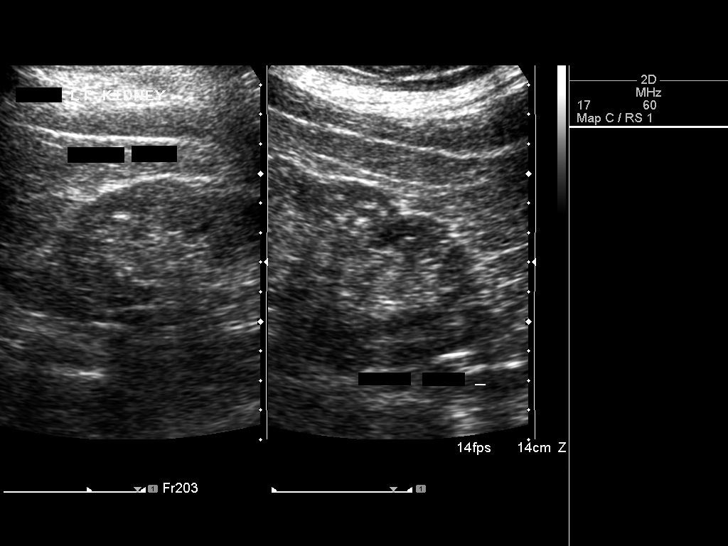
[im 68/68]
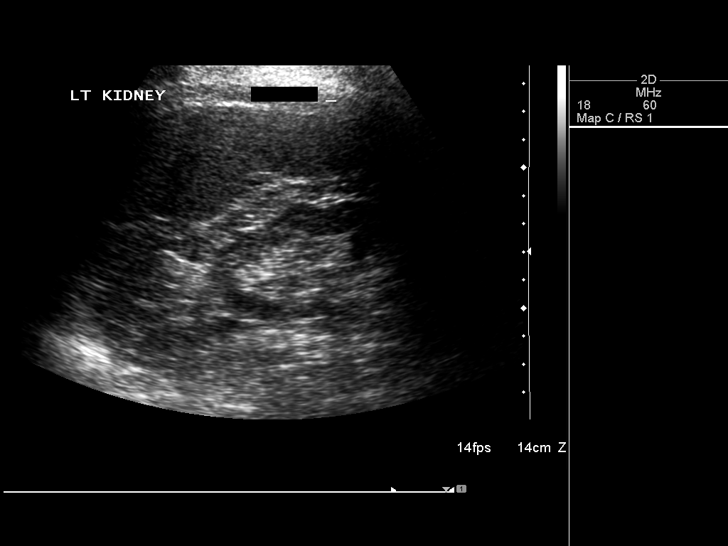

[14 of 25 positions shown; findings below may reference images not displayed]

FINDINGS: Gallbladder: No gallstones or wall thickening visualized. No
sonographic Murphy sign noted by sonographer.

Common bile duct: Diameter: 6 mm

Liver: No focal lesion identified. Coarse echogenic liver with poor
sonic penetration compatible with diffuse hepatic steatosis.

IVC: No abnormality visualized.

Pancreas: Visualized portion unremarkable.

Spleen: Size and appearance within normal limits.

Right Kidney: Length: 11.0 cm. Echogenicity within normal limits. No
mass or hydronephrosis visualized.

Left Kidney: Length: 10.8 cm. Echogenicity within normal limits. No
mass or hydronephrosis visualized.

Abdominal aorta: No aneurysm visualized.

Other findings: None.
IMPRESSION: 1. Coarse echogenic liver with poor sonic penetration compatible
with diffuse hepatic steatosis. Otherwise, no significant
abnormalities are observed.

## 2015-08-09 ENCOUNTER — Ambulatory Visit: Payer: Self-pay | Admitting: Surgery

## 2015-08-11 NOTE — Progress Notes (Signed)
Abbreviations noted per surgical consent order. Please clarify. Thanks. Patients preop appt is scheduled for Monday August 14, 2015.

## 2015-08-14 ENCOUNTER — Encounter (HOSPITAL_COMMUNITY)
Admission: RE | Admit: 2015-08-14 | Discharge: 2015-08-14 | Disposition: A | Discharge status: Home / Self Care | Payer: Federal, State, Local not specified - PPO | Source: Ambulatory Visit | Attending: Surgery | Admitting: Surgery

## 2015-08-14 ENCOUNTER — Ambulatory Visit: Payer: Self-pay | Admitting: Surgery

## 2015-08-14 ENCOUNTER — Ambulatory Visit (HOSPITAL_COMMUNITY)
Admission: RE | Admit: 2015-08-14 | Discharge: 2015-08-14 | Disposition: A | Discharge status: Home / Self Care | Payer: Federal, State, Local not specified - PPO | Source: Ambulatory Visit | Attending: Anesthesiology | Admitting: Anesthesiology

## 2015-08-14 ENCOUNTER — Encounter (HOSPITAL_COMMUNITY): Payer: Self-pay

## 2015-08-14 DIAGNOSIS — Z01818 Encounter for other preprocedural examination: Secondary | ICD-10-CM | POA: Insufficient documentation

## 2015-08-14 DIAGNOSIS — R938 Abnormal findings on diagnostic imaging of other specified body structures: Secondary | ICD-10-CM | POA: Diagnosis not present

## 2015-08-14 DIAGNOSIS — R9389 Abnormal findings on diagnostic imaging of other specified body structures: Secondary | ICD-10-CM

## 2015-08-14 HISTORY — DX: Personal history of other diseases of the digestive system: Z87.19

## 2015-08-14 HISTORY — DX: Polyneuropathy, unspecified: G62.9

## 2015-08-14 HISTORY — DX: Nontoxic goiter, unspecified: E04.9

## 2015-08-14 HISTORY — DX: Chronic kidney disease, unspecified: N18.9

## 2015-08-14 HISTORY — DX: Personal history of urinary (tract) infections: Z87.440

## 2015-08-14 LAB — BASIC METABOLIC PANEL
Anion gap: 11 (ref 5–15)
BUN: 22 mg/dL — AB (ref 6–20)
CALCIUM: 9.6 mg/dL (ref 8.9–10.3)
CO2: 27 mmol/L (ref 22–32)
CREATININE: 1.2 mg/dL — AB (ref 0.44–1.00)
Chloride: 104 mmol/L (ref 101–111)
GFR calc Af Amer: 51 mL/min — ABNORMAL LOW (ref >60)
GFR, EST NON AFRICAN AMERICAN: 44 mL/min — AB (ref >60)
GLUCOSE: 144 mg/dL — AB (ref 65–99)
POTASSIUM: 4.3 mmol/L (ref 3.5–5.1)
SODIUM: 142 mmol/L (ref 135–145)

## 2015-08-14 LAB — CBC
HEMATOCRIT: 41.8 % (ref 36.0–46.0)
Hemoglobin: 13.8 g/dL (ref 12.0–15.0)
MCH: 29.6 pg (ref 26.0–34.0)
MCHC: 33 g/dL (ref 30.0–36.0)
MCV: 89.7 fL (ref 78.0–100.0)
PLATELETS: 189 10*3/uL (ref 150–400)
RBC: 4.66 MIL/uL (ref 3.87–5.11)
RDW: 13.3 % (ref 11.5–15.5)
WBC: 7.3 10*3/uL (ref 4.0–10.5)

## 2015-08-14 NOTE — Patient Instructions (Signed)
JUDEEN CASABLANCA  08/14/2015   Your procedure is scheduled on: Thursday August 17, 2015   Report to [facility] Main  Entrance take [place]  elevators to 3rd floor to  [place] at 5:30 AM.  Call this number if you have problems the morning of surgery [phone]   Remember: ONLY 1 PERSON MAY GO WITH YOU TO SHORT STAY TO GET  READY MORNING OF [place].  Do not eat food or drink liquids :After Midnight.     Take these medicines the morning of surgery with A SIP OF WATER: Levothyroxine; Eye drops if needed (bring bottle with you day of surgery); Pantoprazole (Protonix) DO NOT TAKE ANY DIABETIC MEDICATIONS DAY OF YOUR SURGERY                               You may not have any metal on your body including hair pins and              piercings  Do not wear jewelry, make-up, lotions, powders or perfumes, deodorant             Do not wear nail polish.  Do not shave  48 hours prior to surgery.               Do not bring valuables to the hospital. [place].  Contacts, dentures or bridgework may not be worn into surgery.    _____________________________________________________________________             [facility] - Preparing for Surgery Before surgery, you can play an important role.  Because skin is not sterile, your skin needs to be as free of germs as possible.  You can reduce the number of germs on your skin by washing with CHG (chlorahexidine gluconate) soap before surgery.  CHG is an antiseptic cleaner which kills germs and bonds with the skin to continue killing germs even after washing. Please DO NOT use if you have an allergy to CHG or antibacterial soaps.  If your skin becomes reddened/irritated stop using the CHG and inform your nurse when you arrive at Short Stay. Do not shave (including legs and underarms) for at least 48 hours prior to the first CHG shower.  You may shave your face/neck. Please  follow these instructions carefully:  1.  Shower with CHG Soap the night before surgery and the  morning of Surgery.  2.  If you choose to wash your hair, wash your hair first as usual with your  normal  shampoo.  3.  After you shampoo, rinse your hair and body thoroughly to remove the  shampoo.                           4.  Use CHG as you would any other liquid soap.  You can apply chg directly  to the skin and wash                       Gently with a scrungie or clean washcloth.  5.  Apply the CHG Soap to your body ONLY FROM THE NECK DOWN.   Do not use on face/ open  Wound or open sores. Avoid contact with eyes, ears mouth and genitals (private parts).                       Wash face,  Genitals (private parts) with your normal soap.             6.  Wash thoroughly, paying special attention to the area where your surgery  will be performed.  7.  Thoroughly rinse your body with warm water from the neck down.  8.  DO NOT shower/wash with your normal soap after using and rinsing off  the CHG Soap.                9.  Pat yourself dry with a clean towel.            10.  Wear clean pajamas.            11.  Place clean sheets on your bed the night of your first shower and do not  sleep with pets. Day of Surgery : Do not apply any lotions/deodorants the morning of surgery.  Please wear clean clothes to the hospital/surgery center.  FAILURE TO FOLLOW THESE INSTRUCTIONS MAY RESULT IN THE CANCELLATION OF YOUR SURGERY PATIENT SIGNATURE_________________________________  NURSE SIGNATURE__________________________________  ________________________________________________________________________

## 2015-08-14 NOTE — Progress Notes (Signed)
A1C results per chart per 08/07/2015    7.1 H&P per chart per Dr Reynaldo Minium 08/07/2015

## 2015-08-14 NOTE — Progress Notes (Signed)
EKG / epic 04/02/2015 Stress Test / epic 04/11/2010 ECHO/epic 07/16/2006

## 2015-08-16 ENCOUNTER — Encounter (HOSPITAL_COMMUNITY): Payer: Self-pay | Admitting: Surgery

## 2015-08-16 DIAGNOSIS — R1011 Right upper quadrant pain: Secondary | ICD-10-CM | POA: Diagnosis present

## 2015-08-16 NOTE — H&P (Signed)
General Surgery [facility]  Theresa Shannon. Theresa Shannon DOB: [birth year] Married / Language: English / Race: White Female  History of Present Illness Patient words: gallbladder.  The patient is a 70 year old female who presents with non-malignant abdominal pain.  Patient is referred by Dr. Ronald Lobo for cholecystectomy for treatment of right upper quadrant abdominal pain.  Patient has had an extensive history of intermittent abdominal pain in the right upper quadrant radiating to the back associated with nausea. This does not occur every day. It frequently occurs in the morning. Patient does have a family history of gallbladder disease in her sister who required cholecystectomy. Patient has had an extensive evaluation including ultrasound exam which failed to identify gallstones. Patient had a nuclear medicine hepatobiliary scan which was normal study. Patient has had an upper endoscopy. She has been treated with proton pump inhibitors with mild symptomatic improvement. Laboratory studies are normal. Patient is referred at this time to consider cholecystectomy for management of symptoms which suggest biliary colic. Patient denies any prior history of hepatobiliary or pancreatic disease. She denies jaundice or acholic stools. Her only previous abdominal surgery is total abdominal hysterectomy and right salpingo-oophorectomy.   Other Problems Back Pain Cerebrovascular Accident Chest pain Diabetes Mellitus Gastroesophageal Reflux Disease High blood pressure Lump In Breast Oophorectomy Right. Thyroid Disease  Past Surgical History Appendectomy Breast Biopsy Bilateral. Foot Surgery Left. Hysterectomy (not due to cancer) - Partial Knee Surgery Bilateral. Oral Surgery Tonsillectomy  Diagnostic Studies History Colonoscopy 1-5 years ago Mammogram within last year Pap Smear 1-5 years ago  Allergies OxyCODONE HCl *ANALGESICS - OPIOID*  faints  Medication History  Synthroid (200MCG Tablet, Oral) Active. Triamterene-HCTZ (37.5-25MG  Capsule, Oral) Active. Aspirin (325MG  Tablet, Oral) Active. Atenolol (50MG  Tablet, Oral) Active. MetFORMIN HCl (500MG  Tablet, Oral) Active. Protonix (40MG  Tablet DR, Oral) Active. Restasis (0.05% Emulsion, Ophthalmic) Active. Medications Reconciled  Social History Caffeine use Carbonated beverages. No alcohol use No drug use Tobacco use Never smoker.  Family History Breast Cancer Mother, Sister. Depression Sister. Diabetes Mellitus Brother, Mother, Sister. Heart Disease Brother, Father, Sister. Heart disease in female family member before age 48 Heart disease in female family member before age 65 Hypertension Father. Thyroid problems Sister.  Pregnancy / Birth History Age at menarche 24 years. Age of menopause 28-50 Gravida 2 Maternal age 20-25 Para 1  Review of Systems  General Not Present- Appetite Loss, Chills, Fatigue, Fever, Night Sweats, Weight Gain and Weight Loss. Skin Present- Dryness. Not Present- Change in Wart/Mole, Hives, Jaundice, New Lesions, Non-Healing Wounds, Rash and Ulcer. HEENT Present- Wears glasses/contact lenses. Not Present- Earache, Hearing Loss, Hoarseness, Nose Bleed, Oral Ulcers, Ringing in the Ears, Seasonal Allergies, Sinus Pain, Sore Throat, Visual Disturbances and Yellow Eyes. Respiratory Not Present- Bloody sputum, Chronic Cough, Difficulty Breathing, Snoring and Wheezing. Breast Not Present- Breast Mass, Breast Pain, Nipple Discharge and Skin Changes. Cardiovascular Not Present- Chest Pain, Difficulty Breathing Lying Down, Leg Cramps, Palpitations, Rapid Heart Rate, Shortness of Breath and Swelling of Extremities. Gastrointestinal Present- Abdominal Pain, Bloating, Change in Bowel Habits and Gets full quickly at meals. Not Present- Bloody Stool, Chronic diarrhea, Constipation, Difficulty Swallowing, Excessive gas,  Hemorrhoids, Indigestion, Nausea, Rectal Pain and Vomiting. Female Genitourinary Not Present- Frequency, Nocturia, Painful Urination, Pelvic Pain and Urgency. Musculoskeletal Not Present- Back Pain, Joint Pain, Joint Stiffness, Muscle Pain, Muscle Weakness and Swelling of Extremities. Neurological Not Present- Decreased Memory, Fainting, Headaches, Numbness, Seizures, Tingling, Tremor, Trouble walking and Weakness. Psychiatric Not Present- Anxiety, Bipolar, Change in  Sleep Pattern, Depression, Fearful and Frequent crying. Endocrine Not Present- Cold Intolerance, Excessive Hunger, Hair Changes, Heat Intolerance, Hot flashes and New Diabetes. Hematology Not Present- Easy Bruising, Excessive bleeding, Gland problems, HIV and Persistent Infections.  Vitals Weight: 181.4 lb Height: 63in Body Surface Area: 1.86 m Body Mass Index: 32.13 kg/m  Temp.: 72F(Oral)  Pulse: 55 (Regular)  BP: 126/86 (Sitting, Left Arm, Standard)  Physical Exam  General - appears comfortable, no distress; not diaphorectic  HEENT - normocephalic; sclerae clear, gaze conjugate; mucous membranes moist, dentition good; voice normal  Neck - symmetric on extension; no palpable anterior or posterior cervical adenopathy; no palpable masses in the thyroid bed  Chest - clear bilaterally without rhonchi, rales, or wheeze  Cor - regular rhythm with normal rate; no significant murmur  Abd - soft without distension; mild tenderness to deep palpation right upper quadrant; no palpable mass  Ext - non-tender without significant edema or lymphedema  Neuro - grossly intact; no tremor   Assessment & Plan  ABDOMINAL PAIN, RIGHT UPPER QUADRANT (R10.11)  Patient presents with history of intermittent right upper quadrant abdominal pain radiating to the back. Extensive testing has been unrevealing. Patient has been recommended to undergo cholecystectomy by her gastroenterologist and her primary care physician.  Written  literature on gallbladder surgery is provided to the patient and her husband to review at home.  I have recommended proceeding with laparoscopic cholecystectomy. We discussed the risk and benefits of the procedure. We have discussed the hospital stay to be anticipated. We have discussed her postoperative recovery and return to activities. She and her husband understand and agree to proceed.  The risks and benefits of the procedure have been discussed at length with the patient. The patient understands the proposed procedure, potential alternative treatments, and the course of recovery to be expected. All of the patient's questions have been answered at this time. The patient wishes to proceed with surgery.  Earnstine Regal, MD, [facility] Office: [phone]

## 2015-08-17 ENCOUNTER — Encounter (HOSPITAL_COMMUNITY)
Admission: RE | Disposition: A | Discharge status: Home / Self Care | Payer: Self-pay | Source: Ambulatory Visit | Attending: Surgery

## 2015-08-17 ENCOUNTER — Observation Stay (HOSPITAL_COMMUNITY)
Admission: RE | Admit: 2015-08-17 | Discharge: 2015-08-18 | Disposition: A | Discharge status: Home / Self Care | Payer: Federal, State, Local not specified - PPO | Source: Ambulatory Visit | Attending: Surgery | Admitting: Surgery

## 2015-08-17 ENCOUNTER — Encounter (HOSPITAL_COMMUNITY): Payer: Self-pay

## 2015-08-17 ENCOUNTER — Ambulatory Visit (HOSPITAL_COMMUNITY): Payer: Federal, State, Local not specified - PPO | Admitting: Anesthesiology

## 2015-08-17 ENCOUNTER — Ambulatory Visit (HOSPITAL_COMMUNITY): Payer: Federal, State, Local not specified - PPO

## 2015-08-17 DIAGNOSIS — K811 Chronic cholecystitis: Secondary | ICD-10-CM | POA: Diagnosis not present

## 2015-08-17 DIAGNOSIS — Z9049 Acquired absence of other specified parts of digestive tract: Secondary | ICD-10-CM | POA: Diagnosis not present

## 2015-08-17 DIAGNOSIS — I10 Essential (primary) hypertension: Secondary | ICD-10-CM | POA: Insufficient documentation

## 2015-08-17 DIAGNOSIS — E119 Type 2 diabetes mellitus without complications: Secondary | ICD-10-CM | POA: Diagnosis not present

## 2015-08-17 DIAGNOSIS — K449 Diaphragmatic hernia without obstruction or gangrene: Secondary | ICD-10-CM | POA: Diagnosis not present

## 2015-08-17 DIAGNOSIS — Z7984 Long term (current) use of oral hypoglycemic drugs: Secondary | ICD-10-CM | POA: Diagnosis not present

## 2015-08-17 DIAGNOSIS — R1011 Right upper quadrant pain: Secondary | ICD-10-CM | POA: Diagnosis present

## 2015-08-17 DIAGNOSIS — Z7982 Long term (current) use of aspirin: Secondary | ICD-10-CM | POA: Diagnosis not present

## 2015-08-17 DIAGNOSIS — E079 Disorder of thyroid, unspecified: Secondary | ICD-10-CM | POA: Insufficient documentation

## 2015-08-17 DIAGNOSIS — Z90710 Acquired absence of both cervix and uterus: Secondary | ICD-10-CM | POA: Diagnosis not present

## 2015-08-17 DIAGNOSIS — K219 Gastro-esophageal reflux disease without esophagitis: Secondary | ICD-10-CM | POA: Diagnosis not present

## 2015-08-17 DIAGNOSIS — Z419 Encounter for procedure for purposes other than remedying health state, unspecified: Secondary | ICD-10-CM

## 2015-08-17 DIAGNOSIS — Z8673 Personal history of transient ischemic attack (TIA), and cerebral infarction without residual deficits: Secondary | ICD-10-CM | POA: Diagnosis not present

## 2015-08-17 DIAGNOSIS — Z79899 Other long term (current) drug therapy: Secondary | ICD-10-CM | POA: Insufficient documentation

## 2015-08-17 HISTORY — PX: CHOLECYSTECTOMY: SHX55

## 2015-08-17 LAB — GLUCOSE, CAPILLARY
GLUCOSE-CAPILLARY: 200 mg/dL — AB (ref 65–99)
GLUCOSE-CAPILLARY: 229 mg/dL — AB (ref 65–99)
GLUCOSE-CAPILLARY: 183 mg/dL — AB (ref 65–99)
Glucose-Capillary: 161 mg/dL — ABNORMAL HIGH (ref 65–99)
Glucose-Capillary: 190 mg/dL — ABNORMAL HIGH (ref 65–99)

## 2015-08-17 SURGERY — LAPAROSCOPIC CHOLECYSTECTOMY WITH INTRAOPERATIVE CHOLANGIOGRAM
Anesthesia: General

## 2015-08-17 MED ORDER — PANTOPRAZOLE SODIUM 20 MG PO TBEC
20.0000 mg | DELAYED_RELEASE_TABLET | Freq: Two times a day (BID) | ORAL | Status: DC
  Administered 2015-08-17 – 2015-08-18 (×2): 20 mg via ORAL
  Filled 2015-08-17 (×4): qty 1

## 2015-08-17 MED ORDER — FENTANYL CITRATE (PF) 250 MCG/5ML IJ SOLN
INTRAMUSCULAR | Status: AC
  Filled 2015-08-17: qty 5

## 2015-08-17 MED ORDER — EPHEDRINE SULFATE 50 MG/ML IJ SOLN
INTRAMUSCULAR | Status: AC
  Filled 2015-08-17: qty 1

## 2015-08-17 MED ORDER — HYDROMORPHONE HCL 1 MG/ML IJ SOLN: 1.0000 mg | INTRAMUSCULAR | Status: DC | PRN

## 2015-08-17 MED ORDER — LACTATED RINGERS IR SOLN
Status: DC | PRN
  Administered 2015-08-17: 1000 mL

## 2015-08-17 MED ORDER — FENTANYL CITRATE (PF) 100 MCG/2ML IJ SOLN
INTRAMUSCULAR | Status: DC | PRN
  Administered 2015-08-17: 50 ug via INTRAVENOUS
  Administered 2015-08-17: 150 ug via INTRAVENOUS
  Administered 2015-08-17: 50 ug via INTRAVENOUS

## 2015-08-17 MED ORDER — CEFAZOLIN SODIUM-DEXTROSE 2-3 GM-% IV SOLR
2.0000 g | INTRAVENOUS | Status: AC
  Administered 2015-08-17: 2 g via INTRAVENOUS

## 2015-08-17 MED ORDER — ACETAMINOPHEN 650 MG RE SUPP: 650.0000 mg | Freq: Four times a day (QID) | RECTAL | Status: DC | PRN

## 2015-08-17 MED ORDER — BUPIVACAINE-EPINEPHRINE (PF) 0.25% -1:200000 IJ SOLN
INTRAMUSCULAR | Status: AC
  Filled 2015-08-17: qty 30

## 2015-08-17 MED ORDER — SUCCINYLCHOLINE CHLORIDE 20 MG/ML IJ SOLN
INTRAMUSCULAR | Status: DC | PRN
  Administered 2015-08-17: 100 mg via INTRAVENOUS

## 2015-08-17 MED ORDER — LACTATED RINGERS IV SOLN
INTRAVENOUS | Status: DC | PRN
  Administered 2015-08-17 (×2): via INTRAVENOUS

## 2015-08-17 MED ORDER — PROMETHAZINE HCL 25 MG/ML IJ SOLN: 6.2500 mg | INTRAMUSCULAR | Status: DC | PRN

## 2015-08-17 MED ORDER — INSULIN ASPART 100 UNIT/ML ~~LOC~~ SOLN
0.0000 [IU] | Freq: Three times a day (TID) | SUBCUTANEOUS | Status: DC
  Administered 2015-08-17: 3 [IU] via SUBCUTANEOUS
  Administered 2015-08-17: 5 [IU] via SUBCUTANEOUS
  Administered 2015-08-18: 08:00:00 via SUBCUTANEOUS

## 2015-08-17 MED ORDER — SUGAMMADEX SODIUM 200 MG/2ML IV SOLN
INTRAVENOUS | Status: AC
Start: 2015-08-17 — End: 2015-08-17
  Filled 2015-08-17: qty 2

## 2015-08-17 MED ORDER — NEOSTIGMINE METHYLSULFATE 10 MG/10ML IV SOLN
INTRAVENOUS | Status: AC
  Filled 2015-08-17: qty 1

## 2015-08-17 MED ORDER — DEXAMETHASONE SODIUM PHOSPHATE 10 MG/ML IJ SOLN
INTRAMUSCULAR | Status: AC
  Filled 2015-08-17: qty 1

## 2015-08-17 MED ORDER — ONDANSETRON 4 MG PO TBDP: 4.0000 mg | ORAL_TABLET | Freq: Four times a day (QID) | ORAL | Status: DC | PRN

## 2015-08-17 MED ORDER — TRIAMTERENE-HCTZ 37.5-25 MG PO TABS
1.0000 | ORAL_TABLET | Freq: Every morning | ORAL | Status: DC
  Administered 2015-08-17 – 2015-08-18 (×2): 1 via ORAL
  Filled 2015-08-17 (×2): qty 1

## 2015-08-17 MED ORDER — ACETAMINOPHEN 325 MG PO TABS: 650.0000 mg | ORAL_TABLET | Freq: Four times a day (QID) | ORAL | Status: DC | PRN

## 2015-08-17 MED ORDER — GLYCOPYRROLATE 0.2 MG/ML IJ SOLN
INTRAMUSCULAR | Status: AC
  Filled 2015-08-17: qty 1

## 2015-08-17 MED ORDER — METOCLOPRAMIDE HCL 5 MG/ML IJ SOLN
INTRAMUSCULAR | Status: DC | PRN
Start: 2015-08-17 — End: 2015-08-17
  Administered 2015-08-17: 10 mg via INTRAVENOUS

## 2015-08-17 MED ORDER — LIDOCAINE HCL (CARDIAC) 20 MG/ML IV SOLN
INTRAVENOUS | Status: AC
  Filled 2015-08-17: qty 5

## 2015-08-17 MED ORDER — DEXAMETHASONE SODIUM PHOSPHATE 10 MG/ML IJ SOLN
INTRAMUSCULAR | Status: DC | PRN
  Administered 2015-08-17: 5 mg via INTRAVENOUS

## 2015-08-17 MED ORDER — ROCURONIUM BROMIDE 100 MG/10ML IV SOLN
INTRAVENOUS | Status: AC
  Filled 2015-08-17: qty 1

## 2015-08-17 MED ORDER — ONDANSETRON HCL 4 MG/2ML IJ SOLN: 4.0000 mg | Freq: Four times a day (QID) | INTRAMUSCULAR | Status: DC | PRN

## 2015-08-17 MED ORDER — LIDOCAINE HCL (CARDIAC) 20 MG/ML IV SOLN
INTRAVENOUS | Status: DC | PRN
  Administered 2015-08-17: 50 mg via INTRAVENOUS

## 2015-08-17 MED ORDER — IOHEXOL 300 MG/ML  SOLN
INTRAMUSCULAR | Status: DC | PRN
  Administered 2015-08-17: 12 mL

## 2015-08-17 MED ORDER — LIDOCAINE HCL (CARDIAC) 20 MG/ML IV SOLN
INTRAVENOUS | Status: AC
  Filled 2015-08-17: qty 10

## 2015-08-17 MED ORDER — ROCURONIUM BROMIDE 100 MG/10ML IV SOLN
INTRAVENOUS | Status: DC | PRN
  Administered 2015-08-17: 10 mg via INTRAVENOUS
  Administered 2015-08-17: 30 mg via INTRAVENOUS

## 2015-08-17 MED ORDER — ONDANSETRON HCL 4 MG/2ML IJ SOLN
INTRAMUSCULAR | Status: DC | PRN
  Administered 2015-08-17: 4 mg via INTRAVENOUS

## 2015-08-17 MED ORDER — LEVOTHYROXINE SODIUM 200 MCG PO TABS
200.0000 ug | ORAL_TABLET | Freq: Every day | ORAL | Status: DC
  Administered 2015-08-18: 200 ug via ORAL
  Filled 2015-08-17 (×2): qty 1

## 2015-08-17 MED ORDER — PROPOFOL 10 MG/ML IV BOLUS
INTRAVENOUS | Status: AC
  Filled 2015-08-17: qty 40

## 2015-08-17 MED ORDER — METFORMIN HCL 500 MG PO TABS
500.0000 mg | ORAL_TABLET | Freq: Every day | ORAL | Status: DC
Start: 2015-08-18 — End: 2015-08-18
  Administered 2015-08-18: 500 mg via ORAL
  Filled 2015-08-17 (×2): qty 1

## 2015-08-17 MED ORDER — BUPIVACAINE-EPINEPHRINE 0.5% -1:200000 IJ SOLN
INTRAMUSCULAR | Status: DC | PRN
  Administered 2015-08-17: 20 mL

## 2015-08-17 MED ORDER — SODIUM CHLORIDE 0.9 % IJ SOLN
INTRAMUSCULAR | Status: AC
  Filled 2015-08-17: qty 10

## 2015-08-17 MED ORDER — PROPOFOL 10 MG/ML IV BOLUS
INTRAVENOUS | Status: DC | PRN
  Administered 2015-08-17: 150 mg via INTRAVENOUS

## 2015-08-17 MED ORDER — ATENOLOL 50 MG PO TABS
50.0000 mg | ORAL_TABLET | Freq: Every day | ORAL | Status: DC
  Administered 2015-08-17: 50 mg via ORAL
  Filled 2015-08-17 (×2): qty 1

## 2015-08-17 MED ORDER — MENTHOL 3 MG MT LOZG
1.0000 | LOZENGE | OROMUCOSAL | Status: DC | PRN
  Filled 2015-08-17: qty 9

## 2015-08-17 MED ORDER — LACTATED RINGERS IV SOLN: INTRAVENOUS | Status: DC

## 2015-08-17 MED ORDER — HYDROMORPHONE HCL 1 MG/ML IJ SOLN
INTRAMUSCULAR | Status: AC
  Filled 2015-08-17: qty 1

## 2015-08-17 MED ORDER — CEFAZOLIN SODIUM-DEXTROSE 2-3 GM-% IV SOLR
INTRAVENOUS | Status: AC
  Filled 2015-08-17: qty 50

## 2015-08-17 MED ORDER — GLYCOPYRROLATE 0.2 MG/ML IJ SOLN
INTRAMUSCULAR | Status: DC | PRN
  Administered 2015-08-17: 0.2 mg via INTRAVENOUS

## 2015-08-17 MED ORDER — CYCLOSPORINE 0.05 % OP EMUL
1.0000 [drp] | Freq: Two times a day (BID) | OPHTHALMIC | Status: DC
  Administered 2015-08-17 – 2015-08-18 (×3): 1 [drp] via OPHTHALMIC
  Filled 2015-08-17 (×4): qty 1

## 2015-08-17 MED ORDER — METOCLOPRAMIDE HCL 5 MG/ML IJ SOLN
INTRAMUSCULAR | Status: AC
  Filled 2015-08-17: qty 2

## 2015-08-17 MED ORDER — HYDROMORPHONE HCL 1 MG/ML IJ SOLN
0.2500 mg | INTRAMUSCULAR | Status: DC | PRN
  Administered 2015-08-17 (×2): 0.5 mg via INTRAVENOUS

## 2015-08-17 MED ORDER — SUGAMMADEX SODIUM 200 MG/2ML IV SOLN
INTRAVENOUS | Status: DC | PRN
  Administered 2015-08-17: 400 mg via INTRAVENOUS

## 2015-08-17 MED ORDER — HYDROCODONE-ACETAMINOPHEN 5-325 MG PO TABS
1.0000 | ORAL_TABLET | ORAL | Status: DC | PRN
  Administered 2015-08-17: 1 via ORAL
  Filled 2015-08-17: qty 1

## 2015-08-17 MED ORDER — KCL IN DEXTROSE-NACL 20-5-0.45 MEQ/L-%-% IV SOLN
INTRAVENOUS | Status: DC
  Administered 2015-08-17 (×2): via INTRAVENOUS
  Filled 2015-08-17 (×3): qty 1000

## 2015-08-17 MED ORDER — SUGAMMADEX SODIUM 200 MG/2ML IV SOLN
INTRAVENOUS | Status: AC
  Filled 2015-08-17: qty 2

## 2015-08-17 SURGICAL SUPPLY — 34 items
APPLIER CLIP ROT 10 11.4 M/L (STAPLE) ×3
BENZOIN TINCTURE PRP APPL 2/3 (GAUZE/BANDAGES/DRESSINGS) ×3 IMPLANT
CABLE HIGH FREQUENCY MONO STRZ (ELECTRODE) ×3 IMPLANT
CHLORAPREP W/TINT 26ML (MISCELLANEOUS) ×3 IMPLANT
CLIP APPLIE ROT 10 11.4 M/L (STAPLE) ×1 IMPLANT
CLOSURE WOUND 1/2 X4 (GAUZE/BANDAGES/DRESSINGS) ×1
COVER MAYO STAND STRL (DRAPES) ×3 IMPLANT
COVER SURGICAL LIGHT HANDLE (MISCELLANEOUS) ×3 IMPLANT
DECANTER SPIKE VIAL GLASS SM (MISCELLANEOUS) ×3 IMPLANT
DRAPE C-ARM 42X120 X-RAY (DRAPES) ×3 IMPLANT
DRAPE LAPAROSCOPIC ABDOMINAL (DRAPES) ×3 IMPLANT
ELECT REM PT RETURN 9FT ADLT (ELECTROSURGICAL) ×3
ELECTRODE REM PT RTRN 9FT ADLT (ELECTROSURGICAL) ×1 IMPLANT
GAUZE SPONGE 2X2 8PLY STRL LF (GAUZE/BANDAGES/DRESSINGS) ×1 IMPLANT
GLOVE ECLIPSE 8.0 STRL XLNG CF (GLOVE) ×3 IMPLANT
GLOVE INDICATOR 8.0 STRL GRN (GLOVE) ×3 IMPLANT
GLOVE SURG ORTHO 8.0 STRL STRW (GLOVE) ×3 IMPLANT
GLOVE SURG SS PI 6.5 STRL IVOR (GLOVE) ×3 IMPLANT
GOWN STRL REUS W/TWL XL LVL3 (GOWN DISPOSABLE) ×6 IMPLANT
HEMOSTAT SURGICEL 4X8 (HEMOSTASIS) IMPLANT
KIT BASIN OR (CUSTOM PROCEDURE TRAY) ×3 IMPLANT
POUCH SPECIMEN RETRIEVAL 10MM (ENDOMECHANICALS) ×3 IMPLANT
SCISSORS LAP 5X35 DISP (ENDOMECHANICALS) ×3 IMPLANT
SET CHOLANGIOGRAPH MIX (MISCELLANEOUS) ×3 IMPLANT
SET IRRIG TUBING LAPAROSCOPIC (IRRIGATION / IRRIGATOR) ×3 IMPLANT
SLEEVE XCEL OPT CAN 5 100 (ENDOMECHANICALS) ×3 IMPLANT
SPONGE GAUZE 2X2 STER 10/PKG (GAUZE/BANDAGES/DRESSINGS) ×2
STRIP CLOSURE SKIN 1/2X4 (GAUZE/BANDAGES/DRESSINGS) ×2 IMPLANT
SUT MNCRL AB 4-0 PS2 18 (SUTURE) ×3 IMPLANT
TOWEL OR 17X26 10 PK STRL BLUE (TOWEL DISPOSABLE) ×3 IMPLANT
TRAY LAPAROSCOPIC (CUSTOM PROCEDURE TRAY) ×3 IMPLANT
TROCAR BLADELESS OPT 5 100 (ENDOMECHANICALS) ×3 IMPLANT
TROCAR XCEL BLUNT TIP 100MML (ENDOMECHANICALS) ×3 IMPLANT
TROCAR XCEL NON-BLD 11X100MML (ENDOMECHANICALS) ×3 IMPLANT

## 2015-08-17 NOTE — Progress Notes (Signed)
General Surgery [facility]  Patient doing well post op.  Tolerating diet.  Pain controlled.  Patient would like to stay overnight and be discharged in AM 2/10.  Earnstine Regal, MD, [facility] Office: [phone]

## 2015-08-17 NOTE — Anesthesia Preprocedure Evaluation (Addendum)
Anesthesia Evaluation  Patient identified by MRN, date of birth, ID band Patient awake    Reviewed: Allergy & Precautions, NPO status , Patient's Chart, lab work & pertinent test results, reviewed documented beta blocker date and time   Airway Mallampati: III  TM Distance: >3 FB Neck ROM: Full    Dental  (+) Dental Advisory Given   Pulmonary neg pulmonary ROS,    breath sounds clear to auscultation       Cardiovascular hypertension, Pt. on medications and Pt. on home beta blockers  Rhythm:Regular Rate:Normal  04/2010 Low risk nuclear stress test.   Neuro/Psych CVA    GI/Hepatic Neg liver ROS, hiatal hernia, GERD  ,  Endo/Other  diabetesHypothyroidism   Renal/GU CRFRenal disease     Musculoskeletal   Abdominal   Peds  Hematology negative hematology ROS (+)   Anesthesia Other Findings   Reproductive/Obstetrics                           Lab Results  Component Value Date   WBC 7.3 08/14/2015   HGB 13.8 08/14/2015   HCT 41.8 08/14/2015   MCV 89.7 08/14/2015   PLT 189 08/14/2015   Lab Results  Component Value Date   CREATININE 1.20* 08/14/2015   BUN 22* 08/14/2015   NA 142 08/14/2015   K 4.3 08/14/2015   CL 104 08/14/2015   CO2 27 08/14/2015    Anesthesia Physical Anesthesia Plan  ASA: III  Anesthesia Plan: General   Post-op Pain Management:    Induction: Intravenous  Airway Management Planned: Oral ETT  Additional Equipment:   Intra-op Plan:   Post-operative Plan: Extubation in OR  Informed Consent: I have reviewed the patients History and Physical, chart, labs and discussed the procedure including the risks, benefits and alternatives for the proposed anesthesia with the patient or authorized representative who has indicated his/her understanding and acceptance.   Dental advisory given  Plan Discussed with: CRNA  Anesthesia Plan Comments:         Anesthesia  Quick Evaluation

## 2015-08-17 NOTE — Anesthesia Procedure Notes (Signed)
Procedure Name: Intubation Date/Time: 08/17/2015 7:25 AM Performed by: Theresia Pree, Virgel Gess Pre-anesthesia Checklist: Patient identified, Emergency Drugs available, Suction available, Patient being monitored and Timeout performed Patient Re-evaluated:Patient Re-evaluated prior to inductionOxygen Delivery Method: Circle system utilized Preoxygenation: Pre-oxygenation with 100% oxygen Intubation Type: IV induction Ventilation: Mask ventilation without difficulty Laryngoscope Size: Mac and 4 Grade View: Grade II Tube type: Oral Tube size: 7.5 mm Number of attempts: 1 Airway Equipment and Method: Stylet Placement Confirmation: ETT inserted through vocal cords under direct vision,  positive ETCO2,  CO2 detector and breath sounds checked- equal and bilateral Secured at: 21 cm Tube secured with: Tape Dental Injury: Teeth and Oropharynx as per pre-operative assessment

## 2015-08-17 NOTE — Interval H&P Note (Signed)
History and Physical Interval Note:  08/17/2015 6:58 AM  Theresa Shannon  has presented today for surgery, with the diagnosis of Abdominal pain right upper quadrant.  The various methods of treatment have been discussed with the patient and family. After consideration of risks, benefits and other options for treatment, the patient has consented to    Procedure(s): LAPAROSCOPIC CHOLECYSTECTOMY WITH INTRAOPERATIVE CHOLANGIOGRAM (N/A) as a surgical intervention .    The patient's history has been reviewed, patient examined, no change in status, stable for surgery.  I have reviewed the patient's chart and labs.  Questions were answered to the patient's satisfaction.    Earnstine Regal, MD, [facility] Office: [place]

## 2015-08-17 NOTE — Anesthesia Postprocedure Evaluation (Signed)
Anesthesia Post Note  Patient: Theresa Shannon  Procedure(s) Performed: Procedure(s) (LRB): LAPAROSCOPIC CHOLECYSTECTOMY WITH INTRAOPERATIVE CHOLANGIOGRAM (N/A)  Patient location during evaluation: PACU Anesthesia Type: General Level of consciousness: awake and alert Pain management: pain level controlled Vital Signs Assessment: post-procedure vital signs reviewed and stable Respiratory status: spontaneous breathing Cardiovascular status: blood pressure returned to baseline Anesthetic complications: no    Last Vitals:  Filed Vitals:   08/17/15 1145 08/17/15 1254  BP: 135/54 142/54  Pulse: 63 64  Temp: 36.9 C 36.9 C  Resp: 16 16    Last Pain:  Filed Vitals:   08/17/15 1254  PainSc: 2                  Tiajuana Amass

## 2015-08-17 NOTE — Op Note (Signed)
Procedure Note  Pre-operative Diagnosis:  Abdominal pain RUQ  Post-operative Diagnosis:  same  Surgeon:  Earnstine Regal, MD, FACS  Assistant:  Jackolyn Confer, MD, FACS   Procedure:  Laparoscopic cholecystectomy with intra-operative cholangiography  Anesthesia:  General  Estimated Blood Loss:  minimal  Drains: none         Specimen: Gallbladder to pathology  Indications:  The patient is a 70 year old female who presents with non-malignant abdominal pain.  Patient is referred by Dr. Ronald Lobo for cholecystectomy for treatment of right upper quadrant abdominal pain.  Patient has had an extensive history of intermittent abdominal pain in the right upper quadrant radiating to the back associated with nausea. This does not occur every day. It frequently occurs in the morning. Patient does have a family history of gallbladder disease in her sister who required cholecystectomy. Patient has had an extensive evaluation including ultrasound exam which failed to identify gallstones. Patient had a nuclear medicine hepatobiliary scan which was normal study. Patient has had an upper endoscopy. She has been treated with proton pump inhibitors with mild symptomatic improvement. Laboratory studies are normal. Patient is referred at this time to consider cholecystectomy for management of symptoms which suggest biliary colic.   Procedure Details:  The patient was seen in the pre-op holding area. The risks, benefits, complications, treatment options, and expected outcomes were previously discussed with the patient. The patient agreed with the proposed plan and has signed the informed consent form.  The patient was brought to the Operating Room, identified as Theresa Shannon and the procedure verified as laparoscopic cholecystectomy with intraoperative cholangiography. A "time out" was completed and the above information confirmed.  The patient was placed in the supine position. Following  induction of general anesthesia, the abdomen was prepped and draped in the usual aseptic fashion.  An incision was made in the skin near the umbilicus. The midline fascia was incised and the peritoneal cavity was entered and a Hasson canula was introduced under direct vision.  The Hasson canula was secured with a 0-Vicryl pursestring suture. Pneumoperitoneum was established with carbon dioxide. Additional trocars were introduced under direct vision along the right costal margin in the midline, mid-clavicular line, and anterior axillary line.   The gallbladder was identified and the fundus grasped and retracted cephalad. Adhesions were taken down bluntly and the electrocautery was utilized as needed, taking care not to injure any adjacent structures. The infundibulum was grasped and retracted laterally, exposing the peritoneum overlying the triangle of Calot. The peritoneum was incised and structures exposed with blunt dissection. The cystic duct was clearly identified, bluntly dissected circumferentially, and clipped at the neck of the gallbladder.  An incision was made in the cystic duct and the cholangiogram catheter introduced. The catheter was secured using an ligaclip.  Real-time cholangiography was performed using C-arm fluoroscopy.  There was rapid filling of a normal caliber common bile duct.  There was reflux of contrast into the left and right hepatic ductal systems.  There was free flow distally into the duodenum without filling defect or obstruction.  The catheter was removed from the peritoneal cavity.  The cystic duct was then ligated with surgical clips and divided. The cystic artery was identified, dissected circumferentially, ligated with ligaclips, and divided.  The gallbladder was dissected away from the liver bed using the electrocautery for hemostasis. The gallbladder was completely removed from the liver and placed into an endocatch bag. The gallbladder was removed in the endocatch  bag through  the umbilical port site and submitted to pathology for review.  The right upper quadrant was irrigated and the gallbladder bed was inspected. Hemostasis was achieved with the electrocautery.  Pneumoperitoneum was released after viewing removal of the trocars with good hemostasis noted. The umbilical wound was irrigated and the fascia was then closed with the pursestring suture.  Local anesthetic was infiltrated at all port sites. The skin incisions were closed with 4-0 Monocril subcuticular sutures and steri-strips and dressings were applied.  Instrument, sponge, and needle counts were correct at the conclusion of the case.  The patient was awakened from anesthesia and brought to the recovery room in stable condition.  The patient tolerated the procedure well.   Earnstine Regal, MD, [facility] Office: [phone]

## 2015-08-17 NOTE — Transfer of Care (Signed)
Immediate Anesthesia Transfer of Care Note  Patient: Theresa Shannon  Procedure(s) Performed: Procedure(s): LAPAROSCOPIC CHOLECYSTECTOMY WITH INTRAOPERATIVE CHOLANGIOGRAM (N/A)  Patient Location: PACU  Anesthesia Type:General  Level of Consciousness:  sedated, patient cooperative and responds to stimulation  Airway & Oxygen Therapy:Patient Spontanous Breathing and Patient connected to face mask oxgen  Post-op Assessment:  Report given to PACU RN and Post -op Vital signs reviewed and stable  Post vital signs:  Reviewed and stable  Last Vitals:  Filed Vitals:   08/17/15 0531  BP: 152/61  Pulse: 65  Temp: 36.7 C  Resp: 18    Complications: No apparent anesthesia complications

## 2015-08-18 DIAGNOSIS — K811 Chronic cholecystitis: Secondary | ICD-10-CM | POA: Diagnosis not present

## 2015-08-18 LAB — GLUCOSE, CAPILLARY: GLUCOSE-CAPILLARY: 163 mg/dL — AB (ref 65–99)

## 2015-08-18 MED ORDER — HYDROCODONE-ACETAMINOPHEN 5-325 MG PO TABS: 1.0000 | ORAL_TABLET | ORAL | Status: DC | PRN

## 2015-08-18 NOTE — Discharge Summary (Signed)
Physician Discharge Summary [facility]  Patient ID: Theresa Shannon MRN: [id number] DOB/AGE: 1945-09-07 70 y.o.  Admit date: 08/17/2015 Discharge date: 08/18/2015  Admission Diagnoses:  Chronic cholecystitis, abdominal pain  Discharge Diagnoses:  Principal Problem:   Abdominal pain, right upper quadrant Active Problems:   Chronic cholecystitis   Discharged Condition: good  Hospital Course: Patient was admitted for observation following gallbladder surgery.  Post op course was uncomplicated.  Pain was well controlled.  Tolerated diet. Patient was prepared for discharge home on POD#1.  Consults: None  Treatments: surgery: lap chole with IOC  Discharge Exam: Blood pressure 130/70, pulse 51, temperature 98.2 F (36.8 C), temperature source Oral, resp. rate 16, height 5' 3.25" (1.607 m), weight 82.271 kg (181 lb 6 oz), SpO2 94 %. HEENT - clear Neck - soft Chest - clear bilaterally Cor - RRR Abd - dressings dry and intact  Disposition: Home  Discharge Instructions    Diet - low sodium heart healthy    Complete by:  As directed      Discharge instructions    Complete by:  As directed   [facility]  LAPAROSCOPIC SURGERY:  POST-OP INSTRUCTIONS  Always review your discharge instruction sheet given to you by the facility where your surgery was performed.  A prescription for pain medication may be given to you upon discharge.  Take your pain medication as prescribed.  If narcotic pain medicine is not needed, then you may take acetaminophen (Tylenol) or ibuprofen (Advil) as needed.  Take your usually prescribed medications unless otherwise directed.  If you need a refill on your pain medication, please contact your pharmacy.  They will contact our office to request authorization. Prescriptions will not be filled after 5 P.M. or on weekends.  You should follow a light diet the first few days after arrival home, such as soup and crackers or  toast.  Be sure to include plenty of fluids daily.  Most patients will experience some swelling and bruising in the area of the incisions.  Ice packs will help.  Swelling and bruising can take several days to resolve.   It is common to experience some constipation after surgery.  Increasing fluid intake and taking a stool softener (such as Colace) will usually help or prevent this problem from occurring.  A mild laxative (Milk of Magnesia or Miralax) should be taken according to package instructions if there has been no bowel movement after 48 hours.  You will have steri-strips and a gauze dressing over your incisions.  You may remove the gauze bandage on the second day after surgery, and you may shower at that time.  Leave your steri-strips (small skin tapes) in place directly over the incision.  These strips should remain on the skin for 5-7 days and then be removed.  You may get them wet in the shower and pat them dry.  Any sutures or staples will be removed at the office during your follow-up visit.  ACTIVITIES:  You may resume regular (light) daily activities beginning the next day - such as daily self-care, walking, climbing stairs - gradually increasing activities as tolerated.  You may have sexual intercourse when it is comfortable.  Refrain from any heavy lifting or straining until approved by your doctor.  You may drive when you are no longer taking prescription pain medication, you can comfortably wear a seatbelt, and you can safely maneuver your car and apply brakes.  You should see your doctor in the  office for a follow-up appointment approximately 2-3 weeks after your surgery.  Make sure that you call for this appointment within a day or two after you arrive home to insure a convenient appointment time.  WHEN TO CALL YOUR DOCTOR: Fever over 101.0 Inability to urinate Continued bleeding from incision Increased pain, redness, or drainage from the incision Increasing abdominal  pain  The clinic staff is available to answer your questions during regular business hours.  Please don't hesitate to call and ask to speak to one of the nurses for clinical concerns.  If you have a medical emergency, go to the nearest emergency room or call 911.  A surgeon from [facility] is always on call for the hospital.  Earnstine Regal, MD, [facility] Office: [place]:  [place] [phone]  Website: [email or web address]     Increase activity slowly    Complete by:  As directed      Remove dressing in 24 hours    Complete by:  As directed             Medication List    TAKE these medications        acetaminophen 325 MG tablet  Commonly known as:  TYLENOL  Take 650 mg by mouth every 6 (six) hours as needed for mild pain.     aspirin 325 MG tablet  Take 325 mg by mouth every morning.     atenolol 50 MG tablet  Commonly known as:  TENORMIN  Take 1 tablet (50 mg total) by mouth daily.     CALCIUM 600 + D PO  Take 1 tablet by mouth every morning.     cycloSPORINE 0.05 % ophthalmic emulsion  Commonly known as:  RESTASIS  Place 1 drop into both eyes 2 (two) times daily.     HYDROcodone-acetaminophen 5-325 MG tablet  Commonly known as:  NORCO/VICODIN  Take 1-2 tablets by mouth every 4 (four) hours as needed for moderate pain.     levothyroxine 200 MCG tablet  Commonly known as:  SYNTHROID, LEVOTHROID  Take 200 mcg by mouth every morning.     metFORMIN 500 MG tablet  Commonly known as:  GLUCOPHAGE  Take by mouth daily with breakfast.     pantoprazole 20 MG tablet  Commonly known as:  PROTONIX  Take 20 mg by mouth 2 (two) times daily.     triamterene-hydrochlorothiazide 37.5-25 MG tablet  Commonly known as:  MAXZIDE-25  Take 1 tablet by mouth every morning.           Follow-up Information    Follow up with Earnstine Regal, MD. Schedule an appointment as soon as possible for a visit  in 3 weeks.   Specialty:  General Surgery   Why:  For wound re-check   Contact information:   [place] [address] [phone]       Earnstine Regal, MD, [facility] Office: [phone]   Signed: Earnstine Regal 08/18/2015, 9:58 AM

## 2015-08-18 NOTE — Progress Notes (Signed)
Discharge instructions reviewed with patient and spouse; prescription given. Questions answered.----Donne Hazel, RN

## 2015-08-18 NOTE — Care Management Obs Status (Signed)
[place] NOTIFICATION   Patient Details  Name: KELAHNI VATTER MRN: [id number] Date of Birth: [birth year]   Medicare Observation Status Notification Given:  Yes    Guadalupe Maple, RN 08/18/2015, 10:11 AM

## 2016-05-02 ENCOUNTER — Ambulatory Visit (INDEPENDENT_AMBULATORY_CARE_PROVIDER_SITE_OTHER): Payer: Federal, State, Local not specified - PPO | Admitting: Nurse Practitioner

## 2016-05-02 ENCOUNTER — Other Ambulatory Visit: Payer: Self-pay | Admitting: Nurse Practitioner

## 2016-05-02 ENCOUNTER — Encounter: Payer: Self-pay | Admitting: Nurse Practitioner

## 2016-05-02 VITALS — BP 140/71 | HR 58 | Ht 63.25 in | Wt 186.0 lb

## 2016-05-02 DIAGNOSIS — R51 Headache: Secondary | ICD-10-CM

## 2016-05-02 DIAGNOSIS — R519 Headache, unspecified: Secondary | ICD-10-CM

## 2016-05-02 NOTE — Progress Notes (Signed)
[facility]  PATIENT: Theresa Shannon DOB: [birth year]   REASON FOR VISIT: Follow-up for episodic  headaches HISTORY FROM: Patient    HISTORY OF PRESENT ILLNESS:Theresa Shannon, 70 year old female returns for followup She has episodic headaches. She is well controlled on atenolol. 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 She is exercising by walking. She needs refills on her medication. .She returns for reevaluation  HISTORY:Theresa Shannon is a 70 year old returns today for followup. She has been evaluated for headaches in the past, she has several different types of headaches. Her headaches date back to January of 2009. 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 She underwent a temporal artery biopsy, began having headaches at that time. She has a second type of headache that is more of a stabbing pain which can occur either in the right temporal area or around the right eye. She is unable to identify any precipitating factors, she has no associated redness of the eye, tearing of the eye or running of the nose. Valproic has not been helpful. She has not had any problems with daytime drowsiness and feels that she has restorative sleep. She has been on Lyrica in the past but did not find it to be beneficial. She has elevated blood pressures in the past and was started on atenolol, when last seen her blood pressure continued to be in the [id number] range systolically and atenolol increased to 50 daily.     REVIEW OF SYSTEMS: Full 14 system review of systems performed and notable only for those listed, all others are neg:  Constitutional: neg  Cardiovascular: neg Ear/Nose/Throat: neg  Skin: neg Eyes: neg Respiratory: neg Gastroitestinal: neg  Hematology/Lymphatic: neg    Endocrine: neg Musculoskeletal:neg Allergy/Immunology: neg Neurological: neg Psychiatric: neg Sleep : neg   ALLERGIES: Allergies  Allergen Reactions  . Oxycodone-Acetaminophen     REACTION: Syncope    HOME MEDICATIONS: Outpatient Medications Prior to Visit  Medication Sig Dispense Refill  . acetaminophen (TYLENOL) 325 MG tablet Take 650 mg by mouth every 6 (six) hours as needed for mild pain.    Marland Kitchen aspirin 325 MG tablet Take 325 mg by mouth every morning.     Marland Kitchen atenolol (TENORMIN) 50 MG tablet Take 1 tablet (50 mg total) by mouth daily. (Patient taking differently: Take 50 mg by mouth at bedtime. ) 30 tablet 11  . Calcium Carbonate-Vitamin D (CALCIUM 600 + D PO) Take 1 tablet by mouth every morning.     . cycloSPORINE (RESTASIS) 0.05 % ophthalmic emulsion Place 1 drop into both eyes 2 (two) times daily.     . metFORMIN (GLUCOPHAGE) 500 MG tablet Take by mouth daily with breakfast.    . triamterene-hydrochlorothiazide (MAXZIDE-25) 37.5-25 MG per tablet Take 1 tablet by mouth every morning.     Marland Kitchen HYDROcodone-acetaminophen (NORCO/VICODIN) 5-325 MG tablet Take 1-2 tablets by mouth every 4 (four) hours as needed for moderate pain. 20 tablet 0  . levothyroxine (SYNTHROID, LEVOTHROID) 200 MCG tablet Take 200 mcg by mouth every morning.     . pantoprazole (PROTONIX) 20 MG tablet Take 20 mg by mouth 2 (two) times daily.     No facility-administered medications prior to visit.     PAST MEDICAL HISTORY: Past Medical History:  Diagnosis Date  . Anemia   .  Atypical chest pain    stress test 1/12-normal  . 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 Chronic kidney disease    hypertensive chronic kideny disease benign/ stage 3  . Diabetes mellitus, type 2 (HCC)    diet controlled  . Esophagitis determined by endoscopy 03/2015  . GERD (gastroesophageal reflux disease)   . Gout 04/2015   L toe  . History of frequent urinary tract infections   . History of hiatal hernia   . HTN  (hypertension)   . Hyperlipemia   . Hypothyroidism   . Peripheral neuropathy ([place])   . Stroke [facility])    in the eye only; right eye   . Thyroid goiter    multinodular   . Wears glasses     PAST SURGICAL HISTORY: Past Surgical History:  Procedure Laterality Date  . ABDOMINAL HYSTERECTOMY    . ABSCESS DRAINAGE  1995   breast  . APPENDECTOMY    . BREAST BIOPSY    . BREAST CYST EXCISION  02/26/2012   Procedure: CYST EXCISION BREAST;  Surgeon: Harl Bowie, MD;  Location: [place];  Service: General;  Laterality: Left;  excision chronic cyst left breast  . BUNIONECTOMY     LEFT  . CHOLECYSTECTOMY N/A 08/17/2015   Procedure: LAPAROSCOPIC CHOLECYSTECTOMY WITH INTRAOPERATIVE CHOLANGIOGRAM;  Surgeon: Armandina Gemma, MD;  [facility];  Service: General;  Laterality: N/A;  . DILATION AND CURETTAGE OF UTERUS    . KNEE SURGERY     BILATERAL  . TEMPORAL ARTERY BIOPSY / LIGATION    . TONSILLECTOMY AND ADENOIDECTOMY    . TUBAL LIGATION    . TUMOR REMOVAL     LEFT ARM-lipoma    FAMILY HISTORY: Family History  Problem Relation Age of Onset  . Dementia Father   . Heart disease Father   . Hypertension Father   . Stroke Father   . Dementia Mother   . Kidney disease Mother   . Cancer Mother   . Diabetes Mother     SOCIAL HISTORY: Social History   Social History  . Marital status: Married    Spouse name: Elenore Rota  . Number of children: 1  . Years of education: 12th   Occupational History  . Retired    Social History Main Topics  . Smoking status: Never Smoker  . Smokeless tobacco: Never Used  . Alcohol use No  . Drug use: No  . Sexual activity: Not on file   Other Topics Concern  . Not on file   Social History Narrative   Patient lives at home with husband Elenore Rota.    Patient has 1 child.    Patient is retired.    Patient has a high school education.      PHYSICAL EXAM  Vitals:   05/02/16 1050  BP: 140/71  Pulse: (!) 58  Weight: 186 lb  (84.4 kg)  Height: 5' 3.25" (1.607 m)   Body mass index is 32.69 kg/m. Generalized: Well developed, in no acute distress  Neurological examination  Mentation: Alert oriented to time, place, history taking. Follows all commands speech and language fluent  Cranial nerve II-XII: Pupils were equal round reactive to light extraocular movements were full, visual field were full on confrontational test. Facial sensation and strength were normal. hearing was intact to finger rubbing bilaterally. Uvula tongue midline. head turning and shoulder shrug and were normal and symmetric.Tongue protrusion into cheek strength was normal.  Motor: normal bulk  and tone, full strength in the BUE, BLE, fine finger movements normal, no pronator drift. No focal weakness  Coordination: finger-nose-finger, heel-to-shin bilaterally, no dysmetria  Reflexes: 1+ upper lower and symmetric  Gait and Station: Rising up from seated position without assistance, normal stance, moderate stride, good arm swing, smooth turning, able to perform tiptoe, and heel walking without difficulty.    DIAGNOSTIC DATA (LABS, IMAGING, TESTING) - I reviewed patient records, labs, notes, testing and imaging myself where available.  Lab Results  Component Value Date   WBC 7.3 08/14/2015   HGB 13.8 08/14/2015   HCT 41.8 08/14/2015   MCV 89.7 08/14/2015   PLT 189 08/14/2015      Component Value Date/Time   NA 142 08/14/2015 0930   K 4.3 08/14/2015 0930   CL 104 08/14/2015 0930   CO2 27 08/14/2015 0930   GLUCOSE 144 (H) 08/14/2015 0930   BUN 22 (H) 08/14/2015 0930   CREATININE 1.20 (H) 08/14/2015 0930   CALCIUM 9.6 08/14/2015 0930   GFRNONAA 44 (L) 08/14/2015 0930   GFRAA 51 (L) 08/14/2015 0930    ASSESSMENT AND PLAN  70 y.o. year old female  has a past medical history of HTN (hypertension); Hyperlipemia; Hypothyroidism; Anemia;  Blindness and low vision; Diabetes mellitus, type 2 ([place]); and Episodic headaches here to  follow-up. Her headaches are well controlled  Continue atenolol 50 mg daily for headache management will refill for one year  Follow-up yearly. Dennie Bible, [facility], [facility], APRN  [facility] [address], [place], [place] [address] [phone]

## 2016-05-02 NOTE — Patient Instructions (Signed)
Continue atenolol 50 mg daily for headache management will refill for one year  Follow-up yearly 

## 2016-07-11 ENCOUNTER — Telehealth: Payer: Self-pay | Admitting: *Deleted

## 2016-07-11 NOTE — Telephone Encounter (Signed)
Received fax from [facility] relating to pt possibly being nonadherent to taking atenolol.  Last filled prescription was 05/02/16.  I called pt and she has been taking this regularly and has had enough medication up until this time.  She will get another prescription refill soon.  As per her last ofv note, atenolol has been working well.  [phone], fax  [phone].

## 2016-10-16 ENCOUNTER — Encounter: Payer: Self-pay | Admitting: Cardiovascular Disease

## 2016-11-04 ENCOUNTER — Encounter (INDEPENDENT_AMBULATORY_CARE_PROVIDER_SITE_OTHER): Payer: Self-pay

## 2016-11-04 ENCOUNTER — Ambulatory Visit (INDEPENDENT_AMBULATORY_CARE_PROVIDER_SITE_OTHER): Payer: Federal, State, Local not specified - PPO | Admitting: Cardiovascular Disease

## 2016-11-04 ENCOUNTER — Encounter: Payer: Self-pay | Admitting: Cardiovascular Disease

## 2016-11-04 VITALS — BP 144/80 | HR 59 | Ht 63.0 in | Wt 184.4 lb

## 2016-11-04 DIAGNOSIS — E782 Mixed hyperlipidemia: Secondary | ICD-10-CM | POA: Diagnosis not present

## 2016-11-04 DIAGNOSIS — I10 Essential (primary) hypertension: Secondary | ICD-10-CM | POA: Diagnosis not present

## 2016-11-04 DIAGNOSIS — R079 Chest pain, unspecified: Secondary | ICD-10-CM | POA: Diagnosis not present

## 2016-11-04 MED ORDER — FENOFIBRATE 145 MG PO TABS: 145.0000 mg | ORAL_TABLET | Freq: Every day | ORAL | 3 refills | Status: DC

## 2016-11-04 NOTE — Patient Instructions (Addendum)
Medication Instructions:  START Fenofibrate 145 mg once daily   Labwork: Your physician recommends that you return for lab work in: 3 months a few days before your office visit with Dr. Acie Fredrickson.  You will need to FAST for this appointment - nothing to eat or drink after midnight the night before except water.    Testing/Procedures: Your physician has requested that you have a lexiscan myoview. For further information please visit [email or web address]. Please follow instruction sheet, as given.    Follow-Up: Your physician recommends that you schedule a follow-up appointment in: 3 months with Dr. Acie Fredrickson   If you need a refill on your cardiac medications before your next appointment, please call your pharmacy.   Thank you for choosing [facility]! Christen Bame, RN [phone]

## 2016-11-04 NOTE — Progress Notes (Signed)
Problem List 1. Atypical chest pain  2. Aortic calcification 3. Diabetes mellitus 4. Hypothyroidism 5. Essential HTN    Notes from 2012:   Theresa Shannon is a middle-aged female with history of atypical chest pains. She has a history of diabetes mellitus, hypothyroidism, and hypertension. She has a strong family history of coronary disease. She's feeling quite a bit better since I last saw her in September. She had a day stress Myoview study which was normal. She had no evidence of ischemia and she had normal left ventricular systolic function. She's done quite well since that time. She denies any syncope or presyncope.  11/04/2016:   Theresa Shannon is seen today at the request of Dr. Reynaldo Minium for further evaluation of a calcified aorta that was incidentally noted on an x-ray done in the orthopedic office ( performed due to back pain )   I have seen her in the past.   A Stress myoview from 2009 was negative for ischemia .Marland Kitchen  An echo in 2008 shows normal LV systolic function with mild diastolic dysfunction .  Denies any angina chest pain . Has occasional shocking like pains in her chest.  Only last a split second.  Not related to exertion   Tries to exercise but is limited by her back pain   Labs from her Primary MD Chel = 157 Trigs = 234 HDL =  32 LDL = 78.     Current Outpatient Prescriptions on File Prior to Visit  Medication Sig Dispense Refill  . acetaminophen (TYLENOL) 325 MG tablet Take 650 mg by mouth every 6 (six) hours as needed for mild pain.    Marland Kitchen aspirin 325 MG tablet Take 325 mg by mouth every morning.     Marland Kitchen atenolol (TENORMIN) 50 MG tablet take 1 tablet by mouth once daily 30 tablet 11  . Calcium Carbonate-Vitamin D (CALCIUM 600 + D PO) Take 1 tablet by mouth every morning.     . cycloSPORINE (RESTASIS) 0.05 % ophthalmic emulsion Place 1 drop into both eyes 2 (two) times daily.     Marland Kitchen levothyroxine (SYNTHROID, LEVOTHROID) 175 MCG tablet Take 175 mcg by mouth daily before  breakfast.    . metFORMIN (GLUCOPHAGE) 500 MG tablet Take by mouth daily with breakfast.    . triamterene-hydrochlorothiazide (MAXZIDE-25) 37.5-25 MG per tablet Take 1 tablet by mouth every morning.      No current facility-administered medications on file prior to visit.     Allergies  Allergen Reactions  . Oxycodone-Acetaminophen     REACTION: Syncope    Past Medical History:  Diagnosis Date  . Anemia   . Atypical chest pain    stress test 1/12-normal  . 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 Chronic kidney disease    hypertensive chronic kideny disease benign/ stage 3  . Diabetes mellitus, type 2 (HCC)    diet controlled  . Esophagitis determined by endoscopy 03/2015  . GERD (gastroesophageal reflux disease)   . Gout 04/2015   L toe  . History of frequent urinary tract infections   . History of hiatal hernia   . HTN (hypertension)   . Hyperlipemia   . Hypothyroidism   . Peripheral neuropathy   . Stroke [facility])    in the eye only; right eye   . Thyroid goiter    multinodular   . Wears glasses     Past Surgical History:  Procedure Laterality Date  . ABDOMINAL HYSTERECTOMY    .  ABSCESS DRAINAGE  1995   breast  . APPENDECTOMY    . BREAST BIOPSY    . BREAST CYST EXCISION  02/26/2012   Procedure: CYST EXCISION BREAST;  Surgeon: Harl Bowie, MD;  Location: [place];  Service: General;  Laterality: Left;  excision chronic cyst left breast  . BUNIONECTOMY     LEFT  . CHOLECYSTECTOMY N/A 08/17/2015   Procedure: LAPAROSCOPIC CHOLECYSTECTOMY WITH INTRAOPERATIVE CHOLANGIOGRAM;  Surgeon: Armandina Gemma, MD;  [facility];  Service: General;  Laterality: N/A;  . DILATION AND CURETTAGE OF UTERUS    . KNEE SURGERY     BILATERAL  . TEMPORAL ARTERY BIOPSY / LIGATION    . TONSILLECTOMY AND ADENOIDECTOMY    . TUBAL LIGATION    . TUMOR REMOVAL     LEFT ARM-lipoma    History  Smoking Status  . Never Smoker  Smokeless Tobacco  . Never Used      History  Alcohol Use No    Family History  Problem Relation Age of Onset  . Dementia Father   . Heart disease Father   . Hypertension Father   . Stroke Father   . Dementia Mother   . Kidney disease Mother   . Cancer Mother   . Diabetes Mother     Review of Systems: The patient denies any heat or cold intolerance.  No weight gain or weight loss.  The patient denies headaches or blurry vision.  There is no cough or sputum production.  The patient denies dizziness.  There is no hematuria or hematochezia.  The patient denies any muscle aches or arthritis.  The patient denies any rash.  The patient denies frequent falling or instability.  There is no history of depression or anxiety.  All other systems were reviewed and are negative.  Physical Exam: BP (!) 144/80 (BP Location: Left Arm, Patient Position: Sitting, Cuff Size: Normal)   Pulse (!) 59   Ht 5\' 3"  (1.6 m)   Wt 184 lb 6.4 oz (83.6 kg)   SpO2 93%   BMI 32.66 kg/m  The patient is alert and oriented x 3.  The mood and affect are normal.  The skin is warm and dry.  Color is normal.  The HEENT exam reveals that the sclera are nonicteric.  The mucous membranes are moist.  The carotids are 2+ without bruits.  There is no thyromegaly.  There is no JVD.  The lungs are clear.  The chest wall is non tender.  The heart exam reveals a regular rate with a normal S1 and S2.  There are no murmurs, gallops, or rubs.  The PMI is not displaced.   Abdominal exam reveals good bowel sounds.  There is no guarding or rebound.  There is no hepatosplenomegaly or tenderness.  There are no masses.  Exam of the legs reveal no clubbing, cyanosis, or edema.  The legs are without rashes.  The distal pulses are intact.  Cranial nerves II - XII are intact.  Motor and sensory functions are intact.  The gait is normal.  ECG :   November 04, 2016:   Sinus brady at 4.   NS ST /T wave changes in the ant. Lat leads   Assessment / Plan:  1. Chest pain -   She has  had some atypical CP . Has evidence of aortic atherosclerosis and has NS ST changes  Will get a Lexiscan myoview for further evaluation   2. Hyperlipidemia:   Her  Trig levels are very elevated.  Will start Fenofibrate 145 mg a day .   3. DM  4. Essentian HTN:   BP is well controlled.      Mertie Moores, MD  11/04/2016 10:35 AM    [address],  [place], [place]  [address] Pager [phone] Phone: [phone]; Fax: [phone]

## 2016-11-11 NOTE — Addendum Note (Signed)
Addended by: Emmaline Life on: 11/11/2016 10:08 AM   Modules accepted: Orders

## 2016-11-20 ENCOUNTER — Telehealth: Payer: Self-pay | Admitting: Cardiovascular Disease

## 2016-11-20 NOTE — Telephone Encounter (Signed)
Records rec From [facility]-placed in chart prep.

## 2016-11-26 ENCOUNTER — Telehealth (HOSPITAL_COMMUNITY): Payer: Self-pay | Admitting: *Deleted

## 2016-11-26 NOTE — Telephone Encounter (Signed)
Patient given detailed instructions per Myocardial Perfusion Study Information Sheet for the test on 11/29/16 at [facility]. Patient notified to arrive 15 minutes early and that it is imperative to arrive on time for appointment to keep from having the test rescheduled.  If you need to cancel or reschedule your appointment, please call the office within 24 hours of your appointment. . Patient verbalized understanding.Marylene Buerger, [facility]ana[facility] [place]

## 2016-11-29 ENCOUNTER — Ambulatory Visit (HOSPITAL_COMMUNITY): Payer: Federal, State, Local not specified - PPO | Attending: Cardiology

## 2016-11-29 DIAGNOSIS — I10 Essential (primary) hypertension: Secondary | ICD-10-CM | POA: Diagnosis not present

## 2016-11-29 DIAGNOSIS — R079 Chest pain, unspecified: Secondary | ICD-10-CM | POA: Insufficient documentation

## 2016-11-29 DIAGNOSIS — Z8673 Personal history of transient ischemic attack (TIA), and cerebral infarction without residual deficits: Secondary | ICD-10-CM | POA: Insufficient documentation

## 2016-11-29 DIAGNOSIS — I2510 Atherosclerotic heart disease of native coronary artery without angina pectoris: Secondary | ICD-10-CM | POA: Diagnosis present

## 2016-11-29 LAB — MYOCARDIAL PERFUSION IMAGING
CHL CUP NUCLEAR SDS: 1
CHL CUP NUCLEAR SRS: 1
CHL CUP NUCLEAR SSS: 2
LV sys vol: 20 mL
LVDIAVOL: 70 mL (ref 46–106)
NUC STRESS TID: 1.18
Peak HR: 90 {beats}/min
RATE: 0.42
Rest HR: 53 {beats}/min

## 2016-11-29 MED ORDER — TECHNETIUM TC 99M TETROFOSMIN IV KIT
10.7000 | PACK | Freq: Once | INTRAVENOUS | Status: AC | PRN
  Administered 2016-11-29: 10.7 via INTRAVENOUS
  Filled 2016-11-29: qty 11

## 2016-11-29 MED ORDER — TECHNETIUM TC 99M TETROFOSMIN IV KIT
31.5000 | PACK | Freq: Once | INTRAVENOUS | Status: AC | PRN
  Administered 2016-11-29: 31.5 via INTRAVENOUS
  Filled 2016-11-29: qty 32

## 2016-11-29 MED ORDER — REGADENOSON 0.4 MG/5ML IV SOLN
0.4000 mg | Freq: Once | INTRAVENOUS | Status: AC
  Administered 2016-11-29: 0.4 mg via INTRAVENOUS

## 2017-02-14 ENCOUNTER — Other Ambulatory Visit: Payer: Federal, State, Local not specified - PPO | Admitting: *Deleted

## 2017-02-14 DIAGNOSIS — E782 Mixed hyperlipidemia: Secondary | ICD-10-CM

## 2017-02-14 DIAGNOSIS — I10 Essential (primary) hypertension: Secondary | ICD-10-CM

## 2017-02-14 DIAGNOSIS — R079 Chest pain, unspecified: Secondary | ICD-10-CM

## 2017-02-14 LAB — LIPID PANEL
CHOLESTEROL TOTAL: 153 mg/dL (ref 100–199)
Chol/HDL Ratio: 4.9 ratio — ABNORMAL HIGH (ref 0.0–4.4)
HDL: 31 mg/dL — AB (ref >39)
LDL Calculated: 89 mg/dL (ref 0–99)
TRIGLYCERIDES: 164 mg/dL — AB (ref 0–149)
VLDL CHOLESTEROL CAL: 33 mg/dL (ref 5–40)

## 2017-02-14 LAB — COMPREHENSIVE METABOLIC PANEL
ALBUMIN: 4.2 g/dL (ref 3.5–4.8)
ALK PHOS: 60 IU/L (ref 39–117)
ALT: 27 IU/L (ref 0–32)
AST: 30 IU/L (ref 0–40)
Albumin/Globulin Ratio: 1.6 (ref 1.2–2.2)
BUN / CREAT RATIO: 20 (ref 12–28)
BUN: 25 mg/dL (ref 8–27)
Bilirubin Total: 0.4 mg/dL (ref 0.0–1.2)
CO2: 20 mmol/L (ref 20–29)
CREATININE: 1.28 mg/dL — AB (ref 0.57–1.00)
Calcium: 9.8 mg/dL (ref 8.7–10.3)
Chloride: 103 mmol/L (ref 96–106)
GFR calc Af Amer: 48 mL/min/{1.73_m2} — ABNORMAL LOW (ref >59)
GFR calc non Af Amer: 42 mL/min/{1.73_m2} — ABNORMAL LOW (ref >59)
GLOBULIN, TOTAL: 2.7 g/dL (ref 1.5–4.5)
Glucose: 175 mg/dL — ABNORMAL HIGH (ref 65–99)
Potassium: 3.7 mmol/L (ref 3.5–5.2)
SODIUM: 141 mmol/L (ref 134–144)
Total Protein: 6.9 g/dL (ref 6.0–8.5)

## 2017-02-18 ENCOUNTER — Encounter: Payer: Self-pay | Admitting: Cardiovascular Disease

## 2017-02-18 ENCOUNTER — Ambulatory Visit (INDEPENDENT_AMBULATORY_CARE_PROVIDER_SITE_OTHER): Payer: Federal, State, Local not specified - PPO | Admitting: Cardiovascular Disease

## 2017-02-18 VITALS — BP 126/68 | HR 64 | Ht 63.0 in | Wt 180.8 lb

## 2017-02-18 DIAGNOSIS — I10 Essential (primary) hypertension: Secondary | ICD-10-CM

## 2017-02-18 DIAGNOSIS — E782 Mixed hyperlipidemia: Secondary | ICD-10-CM

## 2017-02-18 NOTE — Progress Notes (Signed)
Problem List 1. Atypical chest pain  2. Aortic calcification 3. Diabetes mellitus 4. Hypothyroidism 5. Essential HTN    Notes from 2012:   Cythnia is a middle-aged female with history of atypical chest pains. She has a history of diabetes mellitus, hypothyroidism, and hypertension. She has a strong family history of coronary disease. She's feeling quite a bit better since I last saw her in September. She had a day stress Myoview study which was normal. She had no evidence of ischemia and she had normal left ventricular systolic function. She's done quite well since that time. She denies any syncope or presyncope.  11/04/2016:   Marvelle is seen today at the request of Dr. Reynaldo Minium for further evaluation of a calcified aorta that was incidentally noted on an x-ray done in the orthopedic office ( performed due to back pain )   I have seen her in the past.   A Stress myoview from 2009 was negative for ischemia .Marland Kitchen  An echo in 2008 shows normal LV systolic function with mild diastolic dysfunction .  Denies any angina chest pain . Has occasional shocking like pains in her chest.  Only last a split second.  Not related to exertion   Tries to exercise but is limited by her back pain   Labs from her Primary MD Chel = 157 Trigs = 234 HDL =  32 LDL = 78.  Aug. 14, 2018:  Followed for her elevated lipids Stress myoview in May, 2018 was normal.  No recent cardiac symptoms Had back pain and abd pain and wound up with a UTI. Exercising some ,  Walks on occasion     Current Outpatient Prescriptions on File Prior to Visit  Medication Sig Dispense Refill  . acetaminophen (TYLENOL) 325 MG tablet Take 650 mg by mouth every 6 (six) hours as needed for mild pain.    Marland Kitchen aspirin 81 MG chewable tablet Chew 81 mg by mouth daily.    Marland Kitchen atenolol (TENORMIN) 50 MG tablet take 1 tablet by mouth once daily 30 tablet 11  . Calcium Carbonate-Vitamin D (CALCIUM 600 + D PO) Take 1 tablet by mouth every  morning.     . cycloSPORINE (RESTASIS) 0.05 % ophthalmic emulsion Place 1 drop into both eyes 2 (two) times daily.     . fenofibrate (TRICOR) 145 MG tablet Take 1 tablet (145 mg total) by mouth daily. 90 tablet 3  . levothyroxine (SYNTHROID, LEVOTHROID) 175 MCG tablet Take 175 mcg by mouth daily before breakfast.    . metFORMIN (GLUCOPHAGE) 500 MG tablet Take by mouth daily with breakfast.    . triamterene-hydrochlorothiazide (MAXZIDE-25) 37.5-25 MG per tablet Take 1 tablet by mouth every morning.      No current facility-administered medications on file prior to visit.     Allergies  Allergen Reactions  . Oxycodone-Acetaminophen     REACTION: Syncope  . Tape Other (See Comments)    Past Medical History:  Diagnosis Date  . Anemia   . Atypical chest pain    stress test 1/12-normal  . 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 Chronic kidney disease    hypertensive chronic kideny disease benign/ stage 3  . Diabetes mellitus, type 2 (HCC)    diet controlled  . Esophagitis determined by endoscopy 03/2015  . GERD (gastroesophageal reflux disease)   . Gout 04/2015   L toe  . History of frequent urinary tract infections   . History of  hiatal hernia   . HTN (hypertension)   . Hyperlipemia   . Hypothyroidism   . Peripheral neuropathy   . Stroke [facility])    in the eye only; right eye   . Thyroid goiter    multinodular   . Wears glasses     Past Surgical History:  Procedure Laterality Date  . ABDOMINAL HYSTERECTOMY    . ABSCESS DRAINAGE  1995   breast  . APPENDECTOMY    . BREAST BIOPSY    . BREAST CYST EXCISION  02/26/2012   Procedure: CYST EXCISION BREAST;  Surgeon: Harl Bowie, MD;  Location: [place];  Service: General;  Laterality: Left;  excision chronic cyst left breast  . BUNIONECTOMY     LEFT  . CHOLECYSTECTOMY N/A 08/17/2015   Procedure: LAPAROSCOPIC CHOLECYSTECTOMY WITH INTRAOPERATIVE CHOLANGIOGRAM;  Surgeon: Armandina Gemma, MD;  [facility];  Service: General;  Laterality: N/A;  . DILATION AND CURETTAGE OF UTERUS    . KNEE SURGERY     BILATERAL  . TEMPORAL ARTERY BIOPSY / LIGATION    . TONSILLECTOMY AND ADENOIDECTOMY    . TUBAL LIGATION    . TUMOR REMOVAL     LEFT ARM-lipoma    History  Smoking Status  . Never Smoker  Smokeless Tobacco  . Never Used    History  Alcohol Use No    Family History  Problem Relation Age of Onset  . Dementia Father   . Heart disease Father   . Hypertension Father   . Stroke Father   . Dementia Mother   . Kidney disease Mother   . Cancer Mother   . Diabetes Mother     Review of Systems: The patient denies any heat or cold intolerance.  No weight gain or weight loss.  The patient denies headaches or blurry vision.  There is no cough or sputum production.  The patient denies dizziness.  There is no hematuria or hematochezia.  The patient denies any muscle aches or arthritis.  The patient denies any rash.  The patient denies frequent falling or instability.  There is no history of depression or anxiety.  All other systems were reviewed and are negative.  Physical Exam: BP 126/68   Pulse 64   Ht 5\' 3"  (1.6 m)   Wt 180 lb 12.8 oz (82 kg)   SpO2 94%   BMI 32.03 kg/m  The patient is alert and oriented x 3.  The mood and affect are normal.  The skin is warm and dry.  Color is normal.  The HEENT exam reveals that the sclera are nonicteric.  The mucous membranes are moist.  The carotids are 2+ without bruits.  There is no thyromegaly.  There is no JVD.  The lungs are clear.  The chest wall is non tender.  The heart exam reveals a regular rate with a normal S1 and S2.  There are no murmurs, gallops, or rubs.  The PMI is not displaced.   Abdominal exam reveals good bowel sounds.  There is no guarding or rebound.  There is no hepatosplenomegaly or tenderness.  There are no masses.  Exam of the legs reveal no clubbing, cyanosis, or edema.  The legs are without rashes.  The distal pulses  are intact.  Cranial nerves II - XII are intact.  Motor and sensory functions are intact.  The gait is normal.  ECG :   November 04, 2016:   Sinus brady at 24.   NS  ST /T wave changes in the ant. Lat leads   Assessment / Plan:  1. Chest pain -   She has had some atypical CP . Has evidence of aortic atherosclerosis and has NS ST changes  Recent Lexiscan myoview was normal  No changes in recommendations. Will follow up with Dr. Reynaldo Minium and will see [place] as needed.   2. Hyperlipidemia:   Her Trig levels are very elevated.   followed by Dr. Reynaldo Minium.   3. DM  4. Essentian HTN:   BP is well controlled.    Mertie Moores, MD  02/18/2017 11:44 AM    [address],  [place], [place]  [address] Pager [phone] Phone: [phone]; Fax: [phone]

## 2017-02-18 NOTE — Patient Instructions (Signed)
Medication Instructions:  Your physician recommends that you continue on your current medications as directed. Please refer to the Current Medication list given to you today.   Labwork: None Ordered   Testing/Procedures: None Ordered   Follow-Up: Your physician recommends that you schedule a follow-up appointment in: as needed with Dr. Nahser   If you need a refill on your cardiac medications before your next appointment, please call your pharmacy.   Thank you for choosing [facility]! Aidon Klemens, RN [phone]    

## 2017-04-23 DIAGNOSIS — E669 Obesity, unspecified: Secondary | ICD-10-CM | POA: Insufficient documentation

## 2017-05-02 NOTE — Progress Notes (Signed)
[facility]  PATIENT: Theresa Shannon DOB: 21-May-1946   REASON FOR VISIT: Follow-up for episodic  Headaches, new complaint of ear fullness, dizziness HISTORY FROM: Patient    HISTORY OF PRESENT ILLNESS:Theresa Shannon, 71 year old female returns for followup She has episodic headaches. She is well controlled on atenolol. 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 She is currently getting some physical therapy for back pain at [facility]. She also complains of ear fullness and dizziness. She denies seasonal allergies .She needs refills on her medication. .She returns for reevaluation  HISTORY:Theresa Shannon is a 71 year old returns today for followup. She has been evaluated for headaches in the past, she has several different types of headaches. Her headaches date back to January of 2009. 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 She underwent a temporal artery biopsy, began having headaches at that time. She has a second type of headache that is more of a stabbing pain which can occur either in the right temporal area or around the right eye. She is unable to identify any precipitating factors, she has no associated redness of the eye, tearing of the eye or running of the nose. Valproic has not been helpful. She has not had any problems with daytime drowsiness and feels that she has restorative sleep. She has been on Lyrica in the past but did not find it to be beneficial. She has elevated blood pressures in the past and was started on atenolol, when last seen her blood pressure continued to be in the 751 range systolically and atenolol increased to 50 daily.     REVIEW OF SYSTEMS: Full 14 system review of systems performed and notable only for those listed, all others are  neg:  Constitutional: neg  Cardiovascular: neg Ear/Nose/Throat: neg  Skin: neg Eyes: neg Respiratory: neg Gastroitestinal: neg  Hematology/Lymphatic: neg  Endocrine: neg Musculoskeletal: Back pain Allergy/Immunology: neg Neurological: Dizziness Psychiatric: neg Sleep : neg   ALLERGIES: Allergies  Allergen Reactions  . Oxycodone-Acetaminophen     REACTION: Syncope  . Tape Other (See Comments)    HOME MEDICATIONS: Outpatient Medications Prior to Visit  Medication Sig Dispense Refill  . acetaminophen (TYLENOL) 325 MG tablet Take 650 mg by mouth every 6 (six) hours as needed for mild pain.    Marland Kitchen aspirin 81 MG chewable tablet Chew 81 mg by mouth daily.    Marland Kitchen atenolol (TENORMIN) 50 MG tablet take 1 tablet by mouth once daily 30 tablet 11  . Calcium Carbonate-Vitamin D (CALCIUM 600 + D PO) Take 1 tablet by mouth every morning.     . cycloSPORINE (RESTASIS) 0.05 % ophthalmic emulsion Place 1 drop into both eyes 2 (two) times daily.     . fenofibrate (TRICOR) 145 MG tablet Take 1 tablet (145 mg total) by mouth daily. 90 tablet 3  . levothyroxine (SYNTHROID, LEVOTHROID) 175 MCG tablet Take 175 mcg by mouth daily before breakfast.    . metFORMIN (GLUCOPHAGE) 500 MG tablet Take by mouth daily with breakfast.    . triamterene-hydrochlorothiazide (MAXZIDE-25) 37.5-25 MG per tablet Take 1 tablet by mouth every morning.      No facility-administered medications prior to visit.     PAST MEDICAL HISTORY: Past Medical History:  Diagnosis Date  . Anemia   . Atypical chest pain  stress test 1/12-normal  . 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 Chronic kidney disease    hypertensive chronic kideny disease benign/ stage 3  . Diabetes mellitus, type 2 (HCC)    diet controlled  . Esophagitis determined by endoscopy 03/2015  . GERD (gastroesophageal reflux disease)   . Gout 04/2015   L toe  . History of frequent urinary tract infections   . History of hiatal hernia   . HTN  (hypertension)   . Hyperlipemia   . Hypothyroidism   . Peripheral neuropathy   . Stroke [facility])    in the eye only; right eye   . Thyroid goiter    multinodular   . Wears glasses     PAST SURGICAL HISTORY: Past Surgical History:  Procedure Laterality Date  . ABDOMINAL HYSTERECTOMY    . ABSCESS DRAINAGE  1995   breast  . APPENDECTOMY    . BREAST BIOPSY    . BREAST CYST EXCISION  02/26/2012   Procedure: CYST EXCISION BREAST;  Surgeon: Harl Bowie, MD;  Location: [place];  Service: General;  Laterality: Left;  excision chronic cyst left breast  . BUNIONECTOMY     LEFT  . CHOLECYSTECTOMY N/A 08/17/2015   Procedure: LAPAROSCOPIC CHOLECYSTECTOMY WITH INTRAOPERATIVE CHOLANGIOGRAM;  Surgeon: Armandina Gemma, MD;  Location: [facility];  Service: General;  Laterality: N/A;  . DILATION AND CURETTAGE OF UTERUS    . KNEE SURGERY     BILATERAL  . TEMPORAL ARTERY BIOPSY / LIGATION    . TONSILLECTOMY AND ADENOIDECTOMY    . TUBAL LIGATION    . TUMOR REMOVAL     LEFT ARM-lipoma    FAMILY HISTORY: Family History  Problem Relation Age of Onset  . Dementia Father   . Heart disease Father   . Hypertension Father   . Stroke Father   . Dementia Mother   . Kidney disease Mother   . Cancer Mother   . Diabetes Mother     SOCIAL HISTORY: Social History   Social History  . Marital status: Married    Spouse name: Elenore Rota  . Number of children: 1  . Years of education: 12th   Occupational History  . Retired    Social History Main Topics  . Smoking status: Never Smoker  . Smokeless tobacco: Never Used  . Alcohol use No  . Drug use: No  . Sexual activity: Not on file   Other Topics Concern  . Not on file   Social History Narrative   Patient lives at home with husband Elenore Rota.    Patient has 1 child.    Patient is retired.    Patient has a high school education.      PHYSICAL EXAM  Vitals:   05/05/17 1116  BP: 139/72  Pulse: (!) 54  Weight: 183 lb 3.2 oz  (83.1 kg)  Height: 5\' 3"  (1.6 m)   Body mass index is 32.45 kg/m. Generalized: Well developed, in no acute distress  Head large amount of cerumen in both ears Neurological examination  Mentation: Alert oriented to time, place, history taking. Follows all commands speech and language fluent  Cranial nerve II-XII: Pupils were equal round reactive to light extraocular movements were full, visual field were full on confrontational test. Facial sensation and strength were normal. hearing was intact to finger rubbing bilaterally. Uvula tongue midline. head turning and shoulder shrug and were normal and symmetric.Tongue protrusion into cheek strength was normal.  Motor: normal bulk and tone, full strength in the BUE, BLE, fine finger movements normal, no pronator drift. No focal weakness  Coordination: finger-nose-finger, heel-to-shin bilaterally, no dysmetria  Reflexes: 1+ upper lower and symmetric  Gait and Station: Rising up from seated position without assistance, normal stance, moderate stride, good arm swing, smooth turning, able to perform tiptoe, and heel walking without difficulty.    DIAGNOSTIC DATA (LABS, IMAGING, TESTING) - I reviewed patient records, labs, notes, testing and imaging myself where available.  Lab Results  Component Value Date   WBC 7.3 08/14/2015   HGB 13.8 08/14/2015   HCT 41.8 08/14/2015   MCV 89.7 08/14/2015   PLT 189 08/14/2015      Component Value Date/Time   NA 141 02/14/2017 0825   K 3.7 02/14/2017 0825   CL 103 02/14/2017 0825   CO2 20 02/14/2017 0825   GLUCOSE 175 (H) 02/14/2017 0825   GLUCOSE 144 (H) 08/14/2015 0930   BUN 25 02/14/2017 0825   CREATININE 1.28 (H) 02/14/2017 0825   CALCIUM 9.8 02/14/2017 0825   PROT 6.9 02/14/2017 0825   ALBUMIN 4.2 02/14/2017 0825   AST 30 02/14/2017 0825   ALT 27 02/14/2017 0825   ALKPHOS 60 02/14/2017 0825   BILITOT 0.4 02/14/2017 0825   GFRNONAA 53 (L) 02/14/2017 0825   GFRAA 48 (L) 02/14/2017 0825     ASSESSMENT AND PLAN  71 y.o. year old female  has a past medical history of HTN (hypertension); Hyperlipemia; Hypothyroidism; Anemia;  Blindness and low vision; Diabetes mellitus, type 2 (Grandfather); and Episodic headaches here to follow-up. Her headaches are well controlled. She complains of dizziness and has bilateral cerumen  Continue atenolol 50 mg daily for headache management will refill for one year Given information on earwax and over-the-counter preparation Debrox  Follow-up yearly. I spent 79min  in total face to face time with the patient more than 50% of which was spent counseling and coordination of care, reviewing test results reviewing medications and discussing and reviewing the diagnosis of headache and earwax buildup and further treatment options. , Rayburn Ma, [facility], APRN  [facility] [address], [place], [place] [address] [phone]

## 2017-05-05 ENCOUNTER — Encounter: Payer: Self-pay | Admitting: Nurse Practitioner

## 2017-05-05 ENCOUNTER — Ambulatory Visit (INDEPENDENT_AMBULATORY_CARE_PROVIDER_SITE_OTHER): Payer: Federal, State, Local not specified - PPO | Admitting: Nurse Practitioner

## 2017-05-05 VITALS — BP 139/72 | HR 54 | Ht 63.0 in | Wt 183.2 lb

## 2017-05-05 DIAGNOSIS — R51 Headache: Secondary | ICD-10-CM

## 2017-05-05 DIAGNOSIS — R519 Headache, unspecified: Secondary | ICD-10-CM

## 2017-05-05 MED ORDER — ATENOLOL 50 MG PO TABS: 50.0000 mg | ORAL_TABLET | Freq: Every day | ORAL | 11 refills | Status: DC

## 2017-05-05 NOTE — Patient Instructions (Addendum)
Continue atenolol 50 mg daily for headache management will refill for one year  Follow-up yearly.  Earwax Buildup, Adult The ears produce a substance called earwax that helps keep bacteria out of the ear and protects the skin in the ear canal. Occasionally, earwax can build up in the ear and cause discomfort or hearing loss. What increases the risk? This condition is more likely to develop in people who:  Are female.  Are elderly.  Naturally produce more earwax.  Clean their ears often with cotton swabs.  Use earplugs often.  Use in-ear headphones often.  Wear hearing aids.  Have narrow ear canals.  Have earwax that is overly thick or sticky.  Have eczema.  Are dehydrated.  Have excess hair in the ear canal.  What are the signs or symptoms? Symptoms of this condition include:  Reduced or muffled hearing.  A feeling of fullness in the ear or feeling that the ear is plugged.  Fluid coming from the ear.  Ear pain.  Ear itch.  Ringing in the ear.  Coughing.  An obvious piece of earwax that can be seen inside the ear canal.  How is this diagnosed? This condition may be diagnosed based on:  Your symptoms.  Your medical history.  An ear exam. During the exam, your health care provider will look into your ear with an instrument called an otoscope.  You may have tests, including a hearing test. How is this treated? This condition may be treated by:  Using ear drops to soften the earwax.  Having the earwax removed by a health care provider. The health care provider may: ? Flush the ear with water. ? Use an instrument that has a loop on the end (curette). ? Use a suction device.  Surgery to remove the wax buildup. This may be done in severe cases.  Follow these instructions at home:  Take over-the-counter and prescription medicines only as told by your health care provider.  Do not put any objects, including cotton swabs, into your ear. You can clean  the opening of your ear canal with a washcloth or facial tissue.  Follow instructions from your health care provider about cleaning your ears. Do not over-clean your ears.  Drink enough fluid to keep your urine clear or pale yellow. This will help to thin the earwax.  Keep all follow-up visits as told by your health care provider. If earwax builds up in your ears often or if you use hearing aids, consider seeing your health care provider for routine, preventive ear cleanings. Ask your health care provider how often you should schedule your cleanings.  If you have hearing aids, clean them according to instructions from the manufacturer and your health care provider. Contact a health care provider if:  You have ear pain.  You develop a fever.  You have blood, pus, or other fluid coming from your ear.  You have hearing loss.  You have ringing in your ears that does not go away.  Your symptoms do not improve with treatment.  You feel like the room is spinning (vertigo). Summary  Earwax can build up in the ear and cause discomfort or hearing loss.  The most common symptoms of this condition include reduced or muffled hearing and a feeling of fullness in the ear or feeling that the ear is plugged.  This condition may be diagnosed based on your symptoms, your medical history, and an ear exam.  This condition may be treated by using ear drops  to soften the earwax or by having the earwax removed by a health care provider.  Do not put any objects, including cotton swabs, into your ear. You can clean the opening of your ear canal with a washcloth or facial tissue. This information is not intended to replace advice given to you by your health care provider. Make sure you discuss any questions you have with your health care provider. Document Released: 08/01/2004 Document Revised: 09/04/2016 Document Reviewed: 09/04/2016 Elsevier Interactive Patient Education  [employer].

## 2017-05-06 NOTE — Progress Notes (Signed)
I have reviewed and agreed above plan. 

## 2017-05-30 ENCOUNTER — Other Ambulatory Visit: Payer: Self-pay | Admitting: Nurse Practitioner

## 2017-08-27 DIAGNOSIS — M47817 Spondylosis without myelopathy or radiculopathy, lumbosacral region: Secondary | ICD-10-CM | POA: Insufficient documentation

## 2017-09-06 IMAGING — RF DG CHOLANGIOGRAM OPERATIVE
1 series · 4 of 4 positions shown · non-contrast
Comparison: Ultrasound 07/26/2015

CLINICAL DATA: 69-year-old female with a history of cholelithiasis.

EXAM:
INTRAOPERATIVE CHOLANGIOGRAM
TECHNIQUE: Cholangiographic images from the C-arm fluoroscopic device were
submitted for interpretation post-operatively. Please see the
procedural report for the amount of contrast and the fluoroscopy
time utilized.

[Series 1: run · 4 of 87 frames shown]
[frame 14/87]
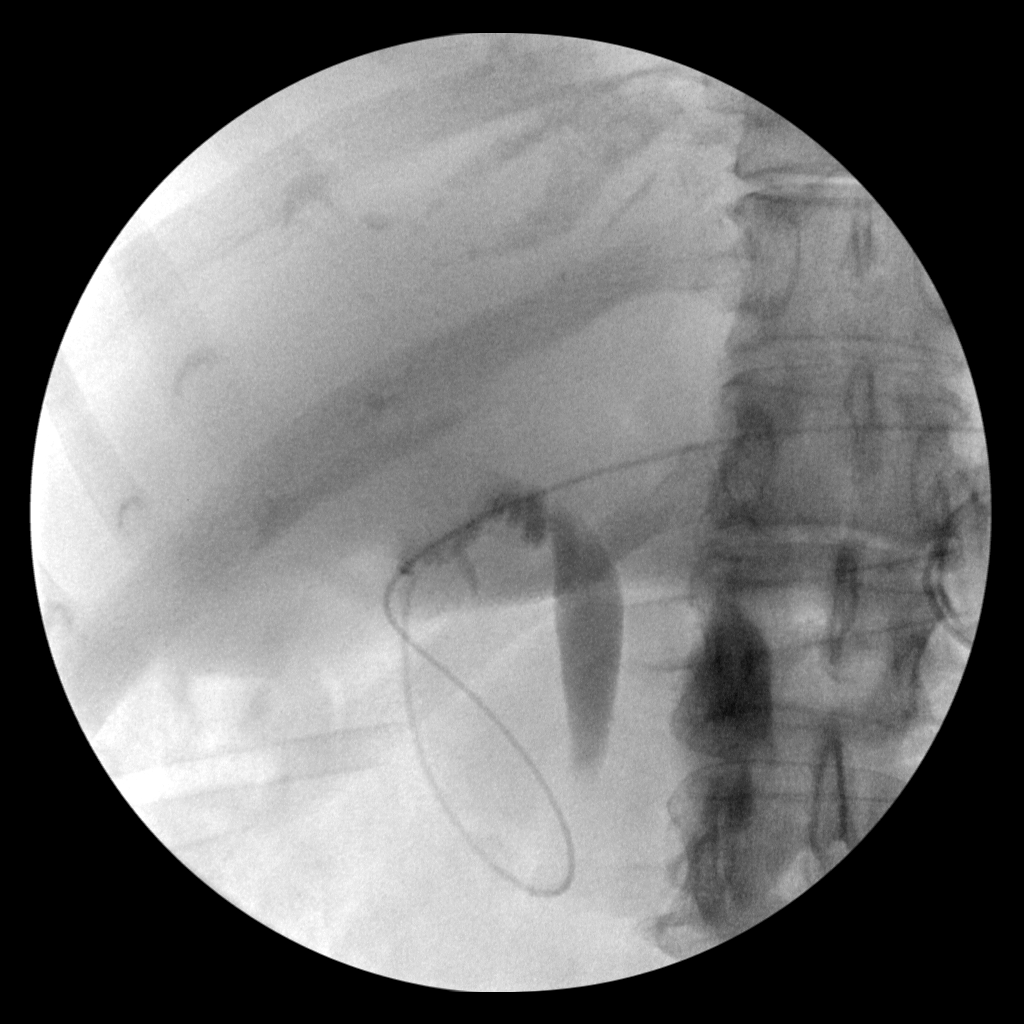
[frame 44/87]
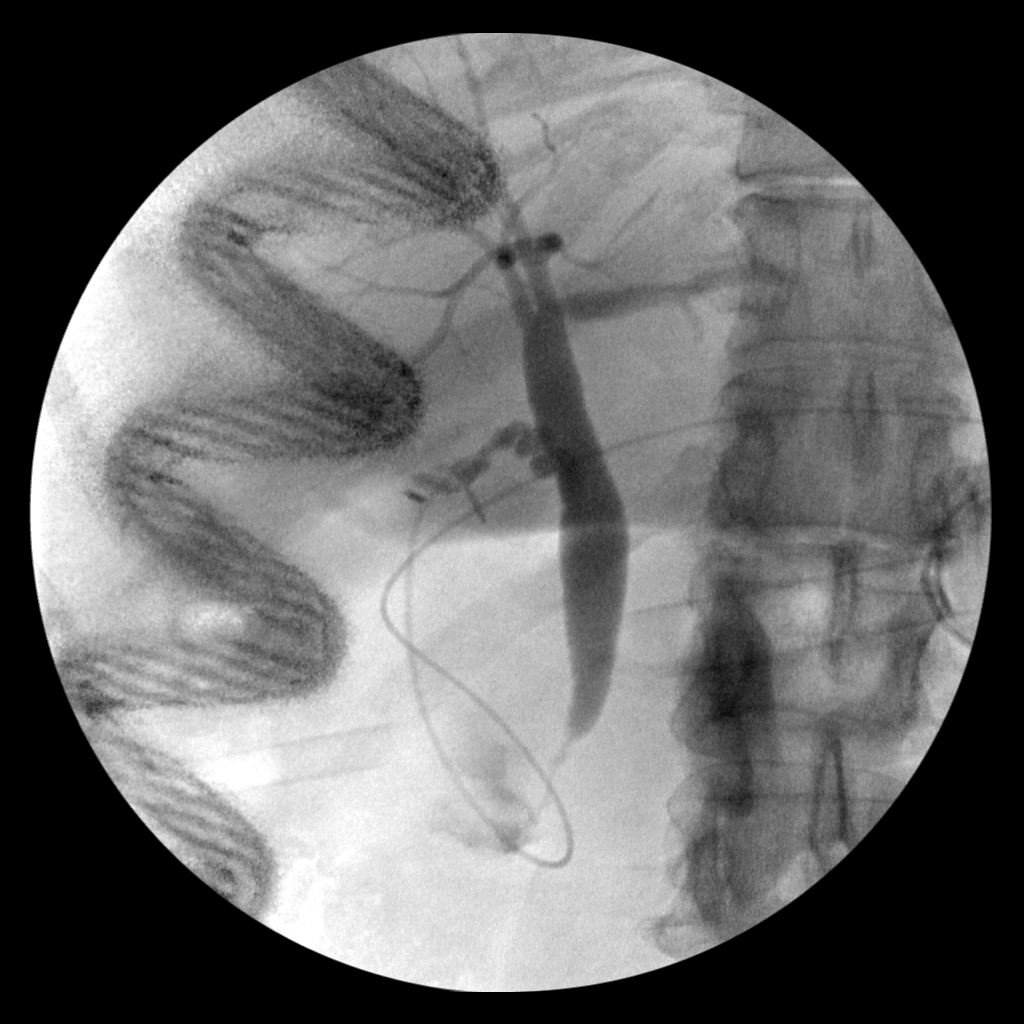
[frame 45/87]
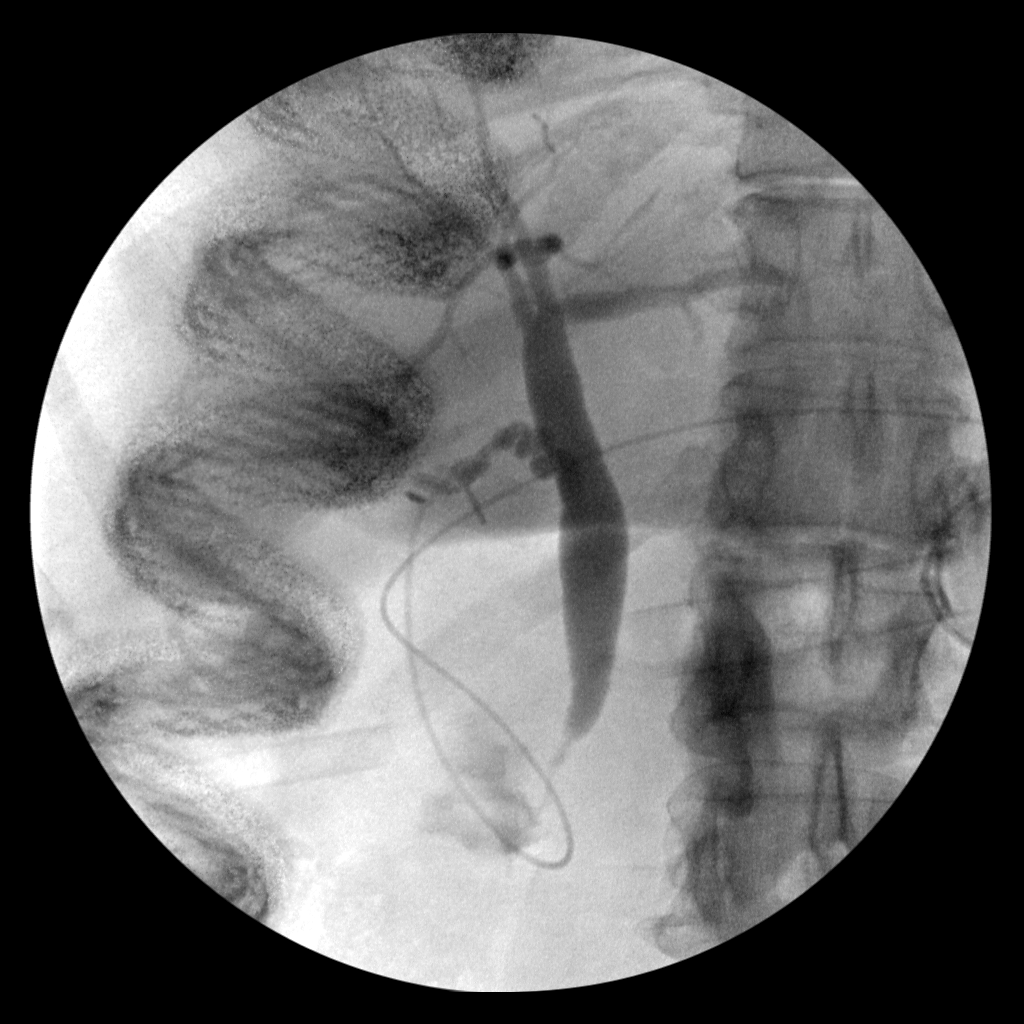
[frame 74/87]
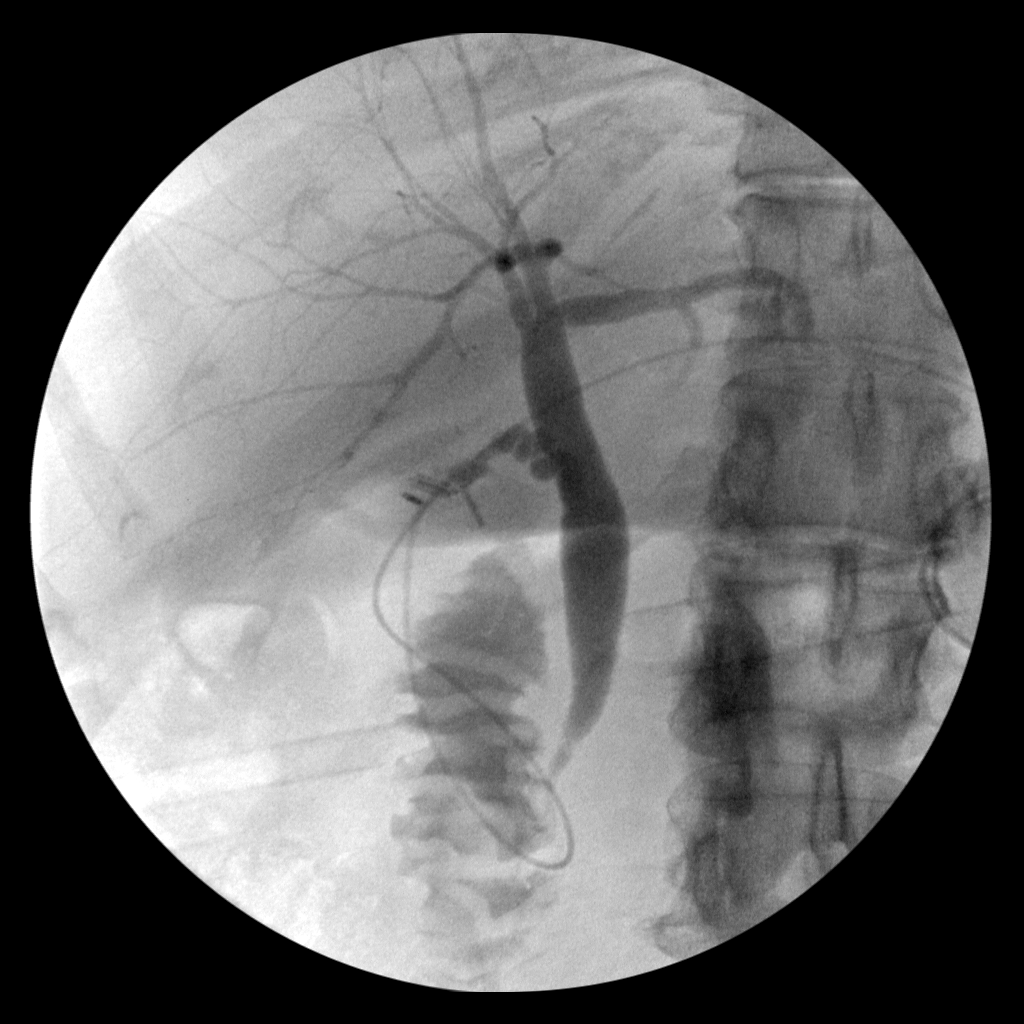

[4 of 4 positions shown; findings below may reference images not displayed]

FINDINGS: Surgical instruments project over the upper abdomen.

There is cannulation of the cystic duct/gallbladder neck, with
antegrade infusion of contrast. Caliber of the extrahepatic ductal
system within normal limits.

No large filling defect identified.

Free flow of contrast across the ampulla.
IMPRESSION: Intraoperative cholangiogram demonstrates extrahepatic biliary ducts
of unremarkable caliber, with no large filling defect identified.
Free flow of contrast across the ampulla.

Please refer to the dictated operative report for full details of
intraoperative findings and procedure

## 2017-09-20 DIAGNOSIS — M48061 Spinal stenosis, lumbar region without neurogenic claudication: Secondary | ICD-10-CM | POA: Insufficient documentation

## 2017-10-17 ENCOUNTER — Other Ambulatory Visit: Payer: Self-pay | Admitting: Cardiovascular Disease

## 2017-11-10 ENCOUNTER — Other Ambulatory Visit: Payer: Self-pay | Admitting: Neurological Surgery

## 2017-11-10 ENCOUNTER — Other Ambulatory Visit (HOSPITAL_COMMUNITY): Payer: Self-pay | Admitting: Neurological Surgery

## 2017-11-10 DIAGNOSIS — M4316 Spondylolisthesis, lumbar region: Secondary | ICD-10-CM

## 2018-01-12 ENCOUNTER — Ambulatory Visit (HOSPITAL_COMMUNITY)
Admission: RE | Admit: 2018-01-12 | Discharge: 2018-01-12 | Disposition: A | Discharge status: Home / Self Care | Payer: Federal, State, Local not specified - PPO | Source: Ambulatory Visit | Attending: Neurological Surgery | Admitting: Neurological Surgery

## 2018-01-12 ENCOUNTER — Encounter (HOSPITAL_COMMUNITY): Payer: Self-pay

## 2018-01-12 DIAGNOSIS — M4316 Spondylolisthesis, lumbar region: Secondary | ICD-10-CM | POA: Diagnosis present

## 2018-01-12 DIAGNOSIS — M5136 Other intervertebral disc degeneration, lumbar region: Secondary | ICD-10-CM | POA: Diagnosis not present

## 2018-01-12 DIAGNOSIS — M48061 Spinal stenosis, lumbar region without neurogenic claudication: Secondary | ICD-10-CM | POA: Insufficient documentation

## 2018-01-12 DIAGNOSIS — M5126 Other intervertebral disc displacement, lumbar region: Secondary | ICD-10-CM | POA: Diagnosis not present

## 2018-01-13 ENCOUNTER — Other Ambulatory Visit: Payer: Self-pay | Admitting: Cardiovascular Disease

## 2018-01-23 NOTE — Pre-Procedure Instructions (Signed)
AVRIE KEDZIERSKI  01/23/2018      [facility] [place] [place] [address] [place] [address] Phone: [phone] Fax: [phone]    Your procedure is scheduled on Mon., February 02, 2018 from 7:30AM-2:53PM  Report to [facility] Admitting Entrance "A" at 5:30AM  Call this number if you have problems the morning of surgery:  [phone]   Remember:  Do not eat or drink after midnight on July 28th    Take these medicines the morning of surgery with A SIP OF WATER: Atenolol (TENORMIN), Levothyroxine (SYNTHROID, LEVOTHROID), and CycloSPORINE (RESTASIS) If needed Acetaminophen (TYLENOL)  Follow your doctors instructions regarding your Aspirin.  If no instructions were given by your doctor, then you will need to call the prescribing office office to get instructions.  As of today, stop taking all Other Aspirin Products, Vitamins, Fish oils, and Herbal medications. Also stop all NSAIDS i.e. Advil, Ibuprofen, Motrin, Aleve, Anaprox, Naproxen, BC, Goody Powders, and all Supplements.     How to Manage Your Diabetes Before and After Surgery  Why is it important to control my blood sugar before and after surgery? . Improving blood sugar levels before and after surgery helps healing and can limit problems. . A way of improving blood sugar control is eating a healthy diet by: o  Eating less sugar and carbohydrates o  Increasing activity/exercise o  Talking with your doctor about reaching your blood sugar goals . High blood sugars (greater than 180 mg/dL) can raise your risk of infections and slow your recovery, so you will need to focus on controlling your diabetes during the weeks before surgery. . Make sure that the doctor who takes care of your diabetes knows about your planned surgery including the date and location.  How do I manage my blood sugar before surgery? . Check your blood sugar at least 4  times a day, starting 2 days before surgery, to make sure that the level is not too high or low. o Check your blood sugar the morning of your surgery when you wake up and every 2 hours until you get to the Short Stay unit. . If your blood sugar is less than 70 mg/dL, you will need to treat for low blood sugar: o Do not take insulin. o Treat a low blood sugar (less than 70 mg/dL) with  cup of clear juice (cranberry or apple), 4 glucose tablets, OR glucose gel. Recheck blood sugar in 15 minutes after treatment (to make sure it is greater than 70 mg/dL). If your blood sugar is not greater than 70 mg/dL on recheck, call [phone] o  for further instructions.  . If your CBG is greater than 220 mg/dL, inform the staff upon arrival to Short Stay  . If you are admitted to the hospital after surgery: o Your blood sugar will be checked by the staff and you will probably be given insulin after surgery (instead of oral diabetes medicines) to make sure you have good blood sugar levels. o The goal for blood sugar control after surgery is 80-180 mg/dL.  WHAT DO I DO ABOUT MY DIABETES MEDICATION?  Marland Kitchen Do not  take MetFORMIN (GLUCOPHAGE) the morning of surgery.  Reviewed and Endorsed by [facility] Patient Education Committee, August 2015    Do not wear jewelry, make-up or nail polish.  Do not wear lotions, powders, or perfumes, or deodorant.  Do not shave 48 hours prior to surgery.    Do not bring valuables to the hospital.  [facility] is not responsible for any belongings or valuables.  Contacts, dentures or bridgework may not be worn into surgery.  Leave your suitcase in the car.  After surgery it may be brought to your room.  For patients admitted to the hospital, discharge time will be determined by your treatment team.  Patients discharged the day of surgery will not be allowed to drive home.   Special instructions:  [facility]- Preparing For Surgery  Before surgery, you can play an  important role. Because skin is not sterile, your skin needs to be as free of germs as possible. You can reduce the number of germs on your skin by washing with CHG (chlorahexidine gluconate) Soap before surgery.  CHG is an antiseptic cleaner which kills germs and bonds with the skin to continue killing germs even after washing.    Oral Hygiene is also important to reduce your risk of infection.  Remember - BRUSH YOUR TEETH THE MORNING OF SURGERY WITH YOUR REGULAR TOOTHPASTE  Please do not use if you have an allergy to CHG or antibacterial soaps. If your skin becomes reddened/irritated stop using the CHG.  Do not shave (including legs and underarms) for at least 48 hours prior to first CHG shower. It is OK to shave your face.  Please follow these instructions carefully.   1. Shower the NIGHT BEFORE SURGERY and the MORNING OF SURGERY with CHG.   2. If you chose to wash your hair, wash your hair first as usual with your normal shampoo.  3. After you shampoo, rinse your hair and body thoroughly to remove the shampoo.  4. Use CHG as you would any other liquid soap. You can apply CHG directly to the skin and wash gently with a scrungie or a clean washcloth.   5. Apply the CHG Soap to your body ONLY FROM THE NECK DOWN.  Do not use on open wounds or open sores. Avoid contact with your eyes, ears, mouth and genitals (private parts). Wash Face and genitals (private parts)  with your normal soap.  6. Wash thoroughly, paying special attention to the area where your surgery will be performed.  7. Thoroughly rinse your body with warm water from the neck down.  8. DO NOT shower/wash with your normal soap after using and rinsing off the CHG Soap.  9. Pat yourself dry with a CLEAN TOWEL.  10. Wear CLEAN PAJAMAS to bed the night before surgery, wear comfortable clothes the morning of surgery  11. Place CLEAN SHEETS on your bed the night of your first shower and DO NOT SLEEP WITH PETS.  Day of  Surgery:  Do not apply any deodorants/lotions.  Please wear clean clothes to the hospital/surgery center.   Remember to brush your teeth WITH YOUR REGULAR TOOTHPASTE.  Please read over the following fact sheets that you were given. Pain Booklet, Coughing and Deep Breathing, MRSA Information and Surgical Site Infection Prevention

## 2018-01-26 ENCOUNTER — Encounter (HOSPITAL_COMMUNITY): Payer: Self-pay

## 2018-01-26 ENCOUNTER — Other Ambulatory Visit: Payer: Self-pay

## 2018-01-26 ENCOUNTER — Encounter (HOSPITAL_COMMUNITY)
Admission: RE | Admit: 2018-01-26 | Discharge: 2018-01-26 | Disposition: A | Discharge status: Home / Self Care | Payer: Federal, State, Local not specified - PPO | Source: Ambulatory Visit | Attending: Neurological Surgery | Admitting: Neurological Surgery

## 2018-01-26 DIAGNOSIS — Z01812 Encounter for preprocedural laboratory examination: Secondary | ICD-10-CM | POA: Diagnosis not present

## 2018-01-26 HISTORY — DX: Spondylolisthesis, lumbar region: M43.16

## 2018-01-26 HISTORY — DX: Personal history of urinary calculi: Z87.442

## 2018-01-26 LAB — SURGICAL PCR SCREEN
MRSA, PCR: NEGATIVE
STAPHYLOCOCCUS AUREUS: NEGATIVE

## 2018-01-26 LAB — CBC
HCT: 39.7 % (ref 36.0–46.0)
Hemoglobin: 12.4 g/dL (ref 12.0–15.0)
MCH: 29.7 pg (ref 26.0–34.0)
MCHC: 31.2 g/dL (ref 30.0–36.0)
MCV: 95.2 fL (ref 78.0–100.0)
PLATELETS: 203 10*3/uL (ref 150–400)
RBC: 4.17 MIL/uL (ref 3.87–5.11)
RDW: 13.7 % (ref 11.5–15.5)
WBC: 5.6 10*3/uL (ref 4.0–10.5)

## 2018-01-26 LAB — BASIC METABOLIC PANEL
Anion gap: 12 (ref 5–15)
BUN: 28 mg/dL — ABNORMAL HIGH (ref 8–23)
CALCIUM: 9.4 mg/dL (ref 8.9–10.3)
CO2: 24 mmol/L (ref 22–32)
CREATININE: 1.33 mg/dL — AB (ref 0.44–1.00)
Chloride: 105 mmol/L (ref 98–111)
GFR, EST AFRICAN AMERICAN: 44 mL/min — AB (ref >60)
GFR, EST NON AFRICAN AMERICAN: 38 mL/min — AB (ref >60)
GLUCOSE: 144 mg/dL — AB (ref 70–99)
Potassium: 4.2 mmol/L (ref 3.5–5.1)
Sodium: 141 mmol/L (ref 135–145)

## 2018-01-26 LAB — TYPE AND SCREEN
ABO/RH(D): O POS
ANTIBODY SCREEN: NEGATIVE

## 2018-01-26 LAB — HEMOGLOBIN A1C
HEMOGLOBIN A1C: 8 % — AB (ref 4.8–5.6)
MEAN PLASMA GLUCOSE: 182.9 mg/dL

## 2018-01-26 LAB — ABO/RH: ABO/RH(D): O POS

## 2018-01-26 LAB — GLUCOSE, CAPILLARY: GLUCOSE-CAPILLARY: 144 mg/dL — AB (ref 70–99)

## 2018-01-26 NOTE — Progress Notes (Addendum)
PCP - Dr. Nada Libman- [facility]  Cardiologist - Dr. Manley Mason  Chest x-ray - Denies  EKG - 01/26/18  Stress Test - 11/29/16 (E)  ECHO - 05/27/07 (E)  Cardiac Cath - Denies  Sleep Study - Denies CPAP - None  LABS- 01/26/18: CBC, BMP, T/S  ASA- LD- 7/23  HA1C- 01/26/18 Fasting Blood Sugar - 120-125, Today 144 Checks Blood Sugar __3___ times a week  Anesthesia- Yes- abnormal  A1C  Pt denies having chest pain, sob, or fever at this time. All instructions explained to the pt, with a verbal understanding of the material. Pt agrees to go over the instructions while at home for a better understanding. The opportunity to ask questions was provided.

## 2018-02-01 ENCOUNTER — Encounter (HOSPITAL_COMMUNITY): Payer: Self-pay | Admitting: Anesthesiology

## 2018-02-01 NOTE — Anesthesia Preprocedure Evaluation (Addendum)
Anesthesia Evaluation  Patient identified by MRN, date of birth, ID band Patient awake    Reviewed: Allergy & Precautions, NPO status , Patient's Chart, lab work & pertinent test results, reviewed documented beta blocker date and time   Airway Mallampati: II  TM Distance: >3 FB Neck ROM: Full    Dental no notable dental hx. (+) Teeth Intact   Pulmonary neg pulmonary ROS,    Pulmonary exam normal breath sounds clear to auscultation       Cardiovascular hypertension, Pt. on home beta blockers and Pt. 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   Abdominal (+) + obese,   Peds  Hematology  (+) anemia ,   Anesthesia Other Findings   Reproductive/Obstetrics                            Anesthesia Physical Anesthesia Plan  ASA: III  Anesthesia Plan: General   Post-op Pain Management:    Induction: Intravenous  PONV Risk Score and Plan: 4 or greater and Ondansetron, Dexamethasone and Treatment may vary due to age or medical condition  Airway Management Planned: Oral ETT  Additional Equipment: Arterial line  Intra-op Plan:   Post-operative Plan: Extubation in OR  Informed Consent: I have reviewed the patients History and Physical, chart, labs and discussed the procedure including the risks, benefits and alternatives for the proposed anesthesia with the patient  or authorized representative who has indicated his/her understanding and acceptance.     Plan Discussed with: CRNA and Surgeon  Anesthesia Plan Comments:        Anesthesia Quick Evaluation

## 2018-02-02 ENCOUNTER — Inpatient Hospital Stay (HOSPITAL_COMMUNITY): Payer: Federal, State, Local not specified - PPO

## 2018-02-02 ENCOUNTER — Encounter (HOSPITAL_COMMUNITY)
Admission: RE | Disposition: A | Discharge status: Home / Self Care | Payer: Self-pay | Source: Ambulatory Visit | Attending: Neurological Surgery

## 2018-02-02 ENCOUNTER — Encounter (HOSPITAL_COMMUNITY): Payer: Self-pay | Admitting: Certified Registered Nurse Anesthetist

## 2018-02-02 ENCOUNTER — Other Ambulatory Visit: Payer: Self-pay

## 2018-02-02 ENCOUNTER — Inpatient Hospital Stay (HOSPITAL_COMMUNITY): Payer: Federal, State, Local not specified - PPO | Admitting: Anesthesiology

## 2018-02-02 ENCOUNTER — Observation Stay (HOSPITAL_COMMUNITY)
Admission: RE | Admit: 2018-02-02 | Discharge: 2018-02-03 | Disposition: A | Discharge status: Home / Self Care | Payer: Federal, State, Local not specified - PPO | Source: Ambulatory Visit | Attending: Neurological Surgery | Admitting: Neurological Surgery

## 2018-02-02 DIAGNOSIS — Z7982 Long term (current) use of aspirin: Secondary | ICD-10-CM | POA: Diagnosis not present

## 2018-02-02 DIAGNOSIS — Z7984 Long term (current) use of oral hypoglycemic drugs: Secondary | ICD-10-CM | POA: Diagnosis not present

## 2018-02-02 DIAGNOSIS — Z79890 Hormone replacement therapy (postmenopausal): Secondary | ICD-10-CM | POA: Insufficient documentation

## 2018-02-02 DIAGNOSIS — M4316 Spondylolisthesis, lumbar region: Secondary | ICD-10-CM | POA: Diagnosis not present

## 2018-02-02 DIAGNOSIS — Z419 Encounter for procedure for purposes other than remedying health state, unspecified: Secondary | ICD-10-CM

## 2018-02-02 DIAGNOSIS — E039 Hypothyroidism, unspecified: Secondary | ICD-10-CM | POA: Insufficient documentation

## 2018-02-02 DIAGNOSIS — K219 Gastro-esophageal reflux disease without esophagitis: Secondary | ICD-10-CM | POA: Diagnosis not present

## 2018-02-02 DIAGNOSIS — E785 Hyperlipidemia, unspecified: Secondary | ICD-10-CM | POA: Insufficient documentation

## 2018-02-02 DIAGNOSIS — I10 Essential (primary) hypertension: Secondary | ICD-10-CM | POA: Diagnosis not present

## 2018-02-02 DIAGNOSIS — Z79899 Other long term (current) drug therapy: Secondary | ICD-10-CM | POA: Insufficient documentation

## 2018-02-02 DIAGNOSIS — Z8673 Personal history of transient ischemic attack (TIA), and cerebral infarction without residual deficits: Secondary | ICD-10-CM | POA: Diagnosis not present

## 2018-02-02 DIAGNOSIS — M549 Dorsalgia, unspecified: Secondary | ICD-10-CM | POA: Diagnosis present

## 2018-02-02 DIAGNOSIS — M5116 Intervertebral disc disorders with radiculopathy, lumbar region: Secondary | ICD-10-CM | POA: Diagnosis not present

## 2018-02-02 DIAGNOSIS — E1142 Type 2 diabetes mellitus with diabetic polyneuropathy: Secondary | ICD-10-CM | POA: Diagnosis not present

## 2018-02-02 HISTORY — PX: ANTERIOR LAT LUMBAR FUSION: SHX1168

## 2018-02-02 HISTORY — PX: LUMBAR PERCUTANEOUS PEDICLE SCREW 1 LEVEL: SHX5560

## 2018-02-02 HISTORY — PX: APPLICATION OF ROBOTIC ASSISTANCE FOR SPINAL PROCEDURE: SHX6753

## 2018-02-02 LAB — GLUCOSE, CAPILLARY
GLUCOSE-CAPILLARY: 195 mg/dL — AB (ref 70–99)
GLUCOSE-CAPILLARY: 243 mg/dL — AB (ref 70–99)
Glucose-Capillary: 162 mg/dL — ABNORMAL HIGH (ref 70–99)
Glucose-Capillary: 219 mg/dL — ABNORMAL HIGH (ref 70–99)

## 2018-02-02 SURGERY — ANTERIOR LATERAL LUMBAR FUSION 1 LEVEL
Anesthesia: General | Site: Spine Lumbar

## 2018-02-02 MED ORDER — BUPIVACAINE HCL (PF) 0.25 % IJ SOLN
INTRAMUSCULAR | Status: AC
  Filled 2018-02-02: qty 30

## 2018-02-02 MED ORDER — LACTATED RINGERS IV SOLN
INTRAVENOUS | Status: DC | PRN
  Administered 2018-02-02: 07:00:00 via INTRAVENOUS

## 2018-02-02 MED ORDER — PROPOFOL 10 MG/ML IV BOLUS
INTRAVENOUS | Status: DC | PRN
  Administered 2018-02-02: 170 mg via INTRAVENOUS
  Administered 2018-02-02: 30 mg via INTRAVENOUS

## 2018-02-02 MED ORDER — ONDANSETRON HCL 4 MG/2ML IJ SOLN
INTRAMUSCULAR | Status: DC | PRN
Start: 2018-02-02 — End: 2018-02-02
  Administered 2018-02-02: 4 mg via INTRAVENOUS

## 2018-02-02 MED ORDER — LEVOTHYROXINE SODIUM 75 MCG PO TABS
175.0000 ug | ORAL_TABLET | Freq: Every day | ORAL | Status: DC
  Administered 2018-02-03: 175 ug via ORAL
  Filled 2018-02-02: qty 1

## 2018-02-02 MED ORDER — CEFAZOLIN SODIUM-DEXTROSE 2-4 GM/100ML-% IV SOLN
2.0000 g | INTRAVENOUS | Status: AC
  Administered 2018-02-02: 2 g via INTRAVENOUS

## 2018-02-02 MED ORDER — ONDANSETRON HCL 4 MG PO TABS: 4.0000 mg | ORAL_TABLET | Freq: Four times a day (QID) | ORAL | Status: DC | PRN

## 2018-02-02 MED ORDER — CYCLOSPORINE 0.05 % OP EMUL
1.0000 [drp] | Freq: Two times a day (BID) | OPHTHALMIC | Status: DC
  Administered 2018-02-02 – 2018-02-03 (×2): 1 [drp] via OPHTHALMIC
  Filled 2018-02-02 (×2): qty 1

## 2018-02-02 MED ORDER — POLYETHYLENE GLYCOL 3350 17 G PO PACK: 17.0000 g | PACK | Freq: Every day | ORAL | Status: DC | PRN

## 2018-02-02 MED ORDER — DEXAMETHASONE SODIUM PHOSPHATE 10 MG/ML IJ SOLN
INTRAMUSCULAR | Status: DC | PRN
  Administered 2018-02-02: 4 mg via INTRAVENOUS

## 2018-02-02 MED ORDER — METHOCARBAMOL 500 MG PO TABS
500.0000 mg | ORAL_TABLET | Freq: Four times a day (QID) | ORAL | Status: DC | PRN
  Administered 2018-02-02 – 2018-02-03 (×4): 500 mg via ORAL
  Filled 2018-02-02 (×3): qty 1

## 2018-02-02 MED ORDER — SUGAMMADEX SODIUM 200 MG/2ML IV SOLN
INTRAVENOUS | Status: DC | PRN
  Administered 2018-02-02: 200 mg via INTRAVENOUS

## 2018-02-02 MED ORDER — DIPHENHYDRAMINE HCL 50 MG/ML IJ SOLN
INTRAMUSCULAR | Status: AC
  Filled 2018-02-02: qty 1

## 2018-02-02 MED ORDER — LACTATED RINGERS IV SOLN
INTRAVENOUS | Status: DC | PRN
  Administered 2018-02-02: 08:00:00 via INTRAVENOUS

## 2018-02-02 MED ORDER — METOCLOPRAMIDE HCL 5 MG/ML IJ SOLN: 10.0000 mg | Freq: Once | INTRAMUSCULAR | Status: DC | PRN

## 2018-02-02 MED ORDER — CHLORHEXIDINE GLUCONATE CLOTH 2 % EX PADS: 6.0000 | MEDICATED_PAD | Freq: Once | CUTANEOUS | Status: DC

## 2018-02-02 MED ORDER — METFORMIN HCL 500 MG PO TABS
500.0000 mg | ORAL_TABLET | Freq: Two times a day (BID) | ORAL | Status: DC
  Administered 2018-02-02 – 2018-02-03 (×2): 500 mg via ORAL
  Filled 2018-02-02 (×2): qty 1

## 2018-02-02 MED ORDER — DOCUSATE SODIUM 100 MG PO CAPS
100.0000 mg | ORAL_CAPSULE | Freq: Two times a day (BID) | ORAL | Status: DC
  Administered 2018-02-02 – 2018-02-03 (×2): 100 mg via ORAL
  Filled 2018-02-02 (×2): qty 1

## 2018-02-02 MED ORDER — MEPERIDINE HCL 50 MG/ML IJ SOLN: 6.2500 mg | INTRAMUSCULAR | Status: DC | PRN

## 2018-02-02 MED ORDER — DEXTROSE 5 % IV SOLN
500.0000 mg | Freq: Four times a day (QID) | INTRAVENOUS | Status: DC | PRN
  Filled 2018-02-02: qty 5

## 2018-02-02 MED ORDER — METHOCARBAMOL 500 MG PO TABS
ORAL_TABLET | ORAL | Status: AC
  Filled 2018-02-02: qty 1

## 2018-02-02 MED ORDER — PHENOL 1.4 % MT LIQD: 1.0000 | OROMUCOSAL | Status: DC | PRN

## 2018-02-02 MED ORDER — SUGAMMADEX SODIUM 200 MG/2ML IV SOLN
INTRAVENOUS | Status: AC
  Filled 2018-02-02: qty 2

## 2018-02-02 MED ORDER — INSULIN ASPART 100 UNIT/ML ~~LOC~~ SOLN
0.0000 [IU] | Freq: Every day | SUBCUTANEOUS | Status: DC
  Administered 2018-02-02: 2 [IU] via SUBCUTANEOUS

## 2018-02-02 MED ORDER — ALUM & MAG HYDROXIDE-SIMETH 200-200-20 MG/5ML PO SUSP: 30.0000 mL | Freq: Four times a day (QID) | ORAL | Status: DC | PRN

## 2018-02-02 MED ORDER — HYDROMORPHONE HCL 1 MG/ML IJ SOLN
0.2500 mg | INTRAMUSCULAR | Status: DC | PRN
  Administered 2018-02-02 (×3): 0.5 mg via INTRAVENOUS

## 2018-02-02 MED ORDER — FLEET ENEMA 7-19 GM/118ML RE ENEM: 1.0000 | ENEMA | Freq: Once | RECTAL | Status: DC | PRN

## 2018-02-02 MED ORDER — HYDROMORPHONE HCL 1 MG/ML IJ SOLN
INTRAMUSCULAR | Status: AC
  Filled 2018-02-02: qty 1

## 2018-02-02 MED ORDER — FENTANYL CITRATE (PF) 100 MCG/2ML IJ SOLN
INTRAMUSCULAR | Status: DC | PRN
  Administered 2018-02-02 (×3): 50 ug via INTRAVENOUS
  Administered 2018-02-02: 25 ug via INTRAVENOUS
  Administered 2018-02-02: 100 ug via INTRAVENOUS

## 2018-02-02 MED ORDER — FENTANYL CITRATE (PF) 250 MCG/5ML IJ SOLN
INTRAMUSCULAR | Status: AC
  Filled 2018-02-02: qty 5

## 2018-02-02 MED ORDER — ACETAMINOPHEN 650 MG RE SUPP: 650.0000 mg | RECTAL | Status: DC | PRN

## 2018-02-02 MED ORDER — ATENOLOL 25 MG PO TABS
50.0000 mg | ORAL_TABLET | Freq: Every day | ORAL | Status: DC
  Administered 2018-02-03: 50 mg via ORAL
  Filled 2018-02-02: qty 2

## 2018-02-02 MED ORDER — HYDROCODONE-ACETAMINOPHEN 5-325 MG PO TABS
1.0000 | ORAL_TABLET | ORAL | Status: DC | PRN
  Administered 2018-02-02 – 2018-02-03 (×5): 2 via ORAL
  Filled 2018-02-02 (×5): qty 2

## 2018-02-02 MED ORDER — MENTHOL 3 MG MT LOZG: 1.0000 | LOZENGE | OROMUCOSAL | Status: DC | PRN

## 2018-02-02 MED ORDER — PROPOFOL 10 MG/ML IV BOLUS
INTRAVENOUS | Status: AC
  Filled 2018-02-02: qty 20

## 2018-02-02 MED ORDER — ACETAMINOPHEN 10 MG/ML IV SOLN
1000.0000 mg | INTRAVENOUS | Status: AC
  Administered 2018-02-02: 1000 mg via INTRAVENOUS
  Filled 2018-02-02: qty 100

## 2018-02-02 MED ORDER — FENOFIBRATE 160 MG PO TABS
160.0000 mg | ORAL_TABLET | Freq: Every day | ORAL | Status: DC
  Administered 2018-02-02 – 2018-02-03 (×2): 160 mg via ORAL
  Filled 2018-02-02 (×2): qty 1

## 2018-02-02 MED ORDER — LIDOCAINE HCL (CARDIAC) PF 100 MG/5ML IV SOSY
PREFILLED_SYRINGE | INTRAVENOUS | Status: DC | PRN
  Administered 2018-02-02: 40 mg via INTRAVENOUS

## 2018-02-02 MED ORDER — CEFAZOLIN SODIUM-DEXTROSE 2-4 GM/100ML-% IV SOLN
INTRAVENOUS | Status: AC
  Filled 2018-02-02: qty 100

## 2018-02-02 MED ORDER — MORPHINE SULFATE (PF) 2 MG/ML IV SOLN: 2.0000 mg | INTRAVENOUS | Status: DC | PRN

## 2018-02-02 MED ORDER — INSULIN ASPART 100 UNIT/ML ~~LOC~~ SOLN
0.0000 [IU] | Freq: Three times a day (TID) | SUBCUTANEOUS | Status: DC
  Administered 2018-02-02: 7 [IU] via SUBCUTANEOUS
  Administered 2018-02-03: 4 [IU] via SUBCUTANEOUS

## 2018-02-02 MED ORDER — GLYCOPYRROLATE PF 0.2 MG/ML IJ SOSY
PREFILLED_SYRINGE | INTRAMUSCULAR | Status: AC
  Filled 2018-02-02: qty 1

## 2018-02-02 MED ORDER — THROMBIN 5000 UNITS EX SOLR
CUTANEOUS | Status: DC | PRN
  Administered 2018-02-02: 5 mL via TOPICAL

## 2018-02-02 MED ORDER — 0.9 % SODIUM CHLORIDE (POUR BTL) OPTIME
TOPICAL | Status: DC | PRN
  Administered 2018-02-02: 1000 mL

## 2018-02-02 MED ORDER — TRIAMTERENE-HCTZ 37.5-25 MG PO TABS
1.0000 | ORAL_TABLET | Freq: Every morning | ORAL | Status: DC
  Administered 2018-02-03: 1 via ORAL
  Filled 2018-02-02: qty 1

## 2018-02-02 MED ORDER — SENNA 8.6 MG PO TABS
1.0000 | ORAL_TABLET | Freq: Two times a day (BID) | ORAL | Status: DC
  Administered 2018-02-02 – 2018-02-03 (×2): 8.6 mg via ORAL
  Filled 2018-02-02 (×2): qty 1

## 2018-02-02 MED ORDER — BUPIVACAINE HCL (PF) 0.25 % IJ SOLN: INTRAMUSCULAR | Status: DC | PRN

## 2018-02-02 MED ORDER — ACETAMINOPHEN 325 MG PO TABS: 650.0000 mg | ORAL_TABLET | ORAL | Status: DC | PRN

## 2018-02-02 MED ORDER — EPHEDRINE SULFATE 50 MG/ML IJ SOLN
INTRAMUSCULAR | Status: DC | PRN
  Administered 2018-02-02: 5 mg via INTRAVENOUS
  Administered 2018-02-02: 2.5 mg via INTRAVENOUS
  Administered 2018-02-02 (×2): 5 mg via INTRAVENOUS
  Administered 2018-02-02: 2.5 mg via INTRAVENOUS

## 2018-02-02 MED ORDER — BUPIVACAINE HCL (PF) 0.5 % IJ SOLN
INTRAMUSCULAR | Status: AC
  Filled 2018-02-02: qty 30

## 2018-02-02 MED ORDER — ROCURONIUM BROMIDE 100 MG/10ML IV SOLN
INTRAVENOUS | Status: DC | PRN
  Administered 2018-02-02: 50 mg via INTRAVENOUS

## 2018-02-02 MED ORDER — ONDANSETRON HCL 4 MG/2ML IJ SOLN: 4.0000 mg | Freq: Four times a day (QID) | INTRAMUSCULAR | Status: DC | PRN

## 2018-02-02 MED ORDER — LACTATED RINGERS IV SOLN
INTRAVENOUS | Status: DC
  Administered 2018-02-02: 16:00:00 via INTRAVENOUS

## 2018-02-02 MED ORDER — SODIUM CHLORIDE 0.9% FLUSH: 3.0000 mL | Freq: Two times a day (BID) | INTRAVENOUS | Status: DC

## 2018-02-02 MED ORDER — THROMBIN 5000 UNITS EX SOLR
CUTANEOUS | Status: AC
  Filled 2018-02-02: qty 5000

## 2018-02-02 MED ORDER — BISACODYL 10 MG RE SUPP: 10.0000 mg | Freq: Every day | RECTAL | Status: DC | PRN

## 2018-02-02 MED ORDER — SODIUM CHLORIDE 0.9% FLUSH: 3.0000 mL | INTRAVENOUS | Status: DC | PRN

## 2018-02-02 MED ORDER — CEFAZOLIN SODIUM-DEXTROSE 2-4 GM/100ML-% IV SOLN
2.0000 g | Freq: Three times a day (TID) | INTRAVENOUS | Status: AC
  Administered 2018-02-02 (×2): 2 g via INTRAVENOUS
  Filled 2018-02-02 (×2): qty 100

## 2018-02-02 MED ORDER — LIDOCAINE-EPINEPHRINE 1 %-1:100000 IJ SOLN
INTRAMUSCULAR | Status: DC | PRN
  Administered 2018-02-02: 5 mL
  Administered 2018-02-02: 3.5 mL
  Administered 2018-02-02: 5 mL

## 2018-02-02 MED ORDER — BACITRACIN 50000 UNITS IM SOLR
INTRAMUSCULAR | Status: DC | PRN
  Administered 2018-02-02: 500 mL

## 2018-02-02 MED ORDER — INSULIN ASPART 100 UNIT/ML ~~LOC~~ SOLN: 0.0000 [IU] | Freq: Three times a day (TID) | SUBCUTANEOUS | Status: DC

## 2018-02-02 MED ORDER — ONDANSETRON HCL 4 MG/2ML IJ SOLN
INTRAMUSCULAR | Status: AC
  Filled 2018-02-02: qty 2

## 2018-02-02 MED ORDER — BUPIVACAINE HCL (PF) 0.5 % IJ SOLN
INTRAMUSCULAR | Status: DC | PRN
  Administered 2018-02-02: 3.5 mL
  Administered 2018-02-02 (×2): 5 mL

## 2018-02-02 MED ORDER — LIDOCAINE-EPINEPHRINE 1 %-1:100000 IJ SOLN
INTRAMUSCULAR | Status: AC
  Filled 2018-02-02: qty 1

## 2018-02-02 SURGICAL SUPPLY — 89 items
BAG DECANTER FOR FLEXI CONT (MISCELLANEOUS) ×3 IMPLANT
BENZOIN TINCTURE PRP APPL 2/3 (GAUZE/BANDAGES/DRESSINGS) IMPLANT
BIT DRILL LONG 3.0X30 (BIT) IMPLANT
BIT DRILL LONG 3.0X30MM (BIT)
BIT DRILL LONG 3X80 (BIT) IMPLANT
BIT DRILL LONG 3X80MM (BIT)
BIT DRILL LONG 4X80 (BIT) IMPLANT
BIT DRILL LONG 4X80MM (BIT)
BIT DRILL SHORT 3.0X30 (BIT) IMPLANT
BIT DRILL SHORT 3.0X30MM (BIT)
BIT DRILL SHORT 3X80 (BIT) IMPLANT
BIT DRILL SHORT 3X80MM (BIT)
BLADE CLIPPER SURG (BLADE) IMPLANT
BLADE SURG 11 STRL SS (BLADE) IMPLANT
BONE CANC CHIPS 20CC PCAN1/4 (Bone Implant) ×3 IMPLANT
CARTRIDGE OIL MAESTRO DRILL (MISCELLANEOUS) IMPLANT
CHIPS CANC BONE 20CC PCAN1/4 (Bone Implant) ×1 IMPLANT
CLIP NEUROVISION LG (CLIP) ×3 IMPLANT
CLOSURE WOUND 1/2 X4 (GAUZE/BANDAGES/DRESSINGS)
CONT SPEC 4OZ CLIKSEAL STRL BL (MISCELLANEOUS) ×3 IMPLANT
COVER BACK TABLE 24X17X13 BIG (DRAPES) IMPLANT
COVER BACK TABLE 60X90IN (DRAPES) ×3 IMPLANT
DERMABOND ADVANCED (GAUZE/BANDAGES/DRESSINGS) ×6
DERMABOND ADVANCED .7 DNX12 (GAUZE/BANDAGES/DRESSINGS) ×3 IMPLANT
DIFFUSER DRILL AIR PNEUMATIC (MISCELLANEOUS) IMPLANT
DRAPE C-ARM 42X72 X-RAY (DRAPES) ×9 IMPLANT
DRAPE C-ARMOR (DRAPES) ×6 IMPLANT
DRAPE LAPAROTOMY 100X72X124 (DRAPES) ×6 IMPLANT
DRAPE POUCH INSTRU U-SHP 10X18 (DRAPES) IMPLANT
DRAPE SHEET LG 3/4 BI-LAMINATE (DRAPES) IMPLANT
DRAPE SURG 17X23 STRL (DRAPES) IMPLANT
DURAPREP 26ML APPLICATOR (WOUND CARE) ×6 IMPLANT
ELECT BLADE 4.0 EZ CLEAN MEGAD (MISCELLANEOUS) ×3
ELECT REM PT RETURN 9FT ADLT (ELECTROSURGICAL) ×6
ELECTRODE BLDE 4.0 EZ CLN MEGD (MISCELLANEOUS) ×1 IMPLANT
ELECTRODE REM PT RTRN 9FT ADLT (ELECTROSURGICAL) ×2 IMPLANT
GAUZE SPONGE 4X4 12PLY STRL (GAUZE/BANDAGES/DRESSINGS) ×3 IMPLANT
GAUZE SPONGE 4X4 16PLY XRAY LF (GAUZE/BANDAGES/DRESSINGS) IMPLANT
GLOVE BIOGEL PI IND STRL 6.5 (GLOVE) ×2 IMPLANT
GLOVE BIOGEL PI IND STRL 8.5 (GLOVE) ×2 IMPLANT
GLOVE BIOGEL PI INDICATOR 6.5 (GLOVE) ×4
GLOVE BIOGEL PI INDICATOR 8.5 (GLOVE) ×4
GLOVE ECLIPSE 8.5 STRL (GLOVE) ×6 IMPLANT
GLOVE ECLIPSE 9.0 STRL (GLOVE) ×3 IMPLANT
GLOVE EXAM NITRILE LRG STRL (GLOVE) IMPLANT
GLOVE EXAM NITRILE XL STR (GLOVE) IMPLANT
GLOVE EXAM NITRILE XS STR PU (GLOVE) IMPLANT
GLOVE SURG SS PI 6.5 STRL IVOR (GLOVE) ×12 IMPLANT
GOWN STRL REUS W/ TWL LRG LVL3 (GOWN DISPOSABLE) ×2 IMPLANT
GOWN STRL REUS W/ TWL XL LVL3 (GOWN DISPOSABLE) ×1 IMPLANT
GOWN STRL REUS W/TWL 2XL LVL3 (GOWN DISPOSABLE) ×6 IMPLANT
GOWN STRL REUS W/TWL LRG LVL3 (GOWN DISPOSABLE) ×4
GOWN STRL REUS W/TWL XL LVL3 (GOWN DISPOSABLE) ×2
GUIDEWIRE NITINOL BEVEL TIP (WIRE) ×12 IMPLANT
KIT BASIN OR (CUSTOM PROCEDURE TRAY) ×3 IMPLANT
KIT DILATOR XLIF 5 (KITS) ×2 IMPLANT
KIT INFUSE XX SMALL 0.7CC (Orthopedic Implant) ×3 IMPLANT
KIT SPINE MAZOR X ROBO DISP (MISCELLANEOUS) ×3 IMPLANT
KIT SURGICAL ACCESS MAXCESS 4 (KITS) ×3 IMPLANT
KIT TURNOVER KIT B (KITS) ×3 IMPLANT
KIT XLIF (KITS) ×1
MARKER SKIN DUAL TIP RULER LAB (MISCELLANEOUS) ×3 IMPLANT
MODULE NVM5 NEXT GEN EMG (NEEDLE) ×3 IMPLANT
MODULUS XLW 10X22X55MM 10 (Spine Construct) ×3 IMPLANT
NEEDLE HYPO 25X1 1.5 SAFETY (NEEDLE) ×6 IMPLANT
NS IRRIG 1000ML POUR BTL (IV SOLUTION) ×3 IMPLANT
OIL CARTRIDGE MAESTRO DRILL (MISCELLANEOUS)
PACK LAMINECTOMY NEURO (CUSTOM PROCEDURE TRAY) ×6 IMPLANT
PAD ARMBOARD 7.5X6 YLW CONV (MISCELLANEOUS) ×15 IMPLANT
PATTIES SURGICAL .5 X.5 (GAUZE/BANDAGES/DRESSINGS) IMPLANT
PATTIES SURGICAL .5 X1 (DISPOSABLE) IMPLANT
PATTIES SURGICAL 1X1 (DISPOSABLE) IMPLANT
PIN HEAD 2.5X60MM (PIN) IMPLANT
ROD RELINE MAS LORD 5.5X45MM (Rod) ×6 IMPLANT
SCREW LOCK RELINE 5.5 TULIP (Screw) ×12 IMPLANT
SCREW MAS RELINE 6.5X45 POLY (Screw) ×6 IMPLANT
SCREW MAS RELINE 6.5X50 POLY (Screw) ×6 IMPLANT
SCREW SCHANZ SA 4.0MM (MISCELLANEOUS) IMPLANT
SPONGE LAP 4X18 RFD (DISPOSABLE) IMPLANT
STRIP CLOSURE SKIN 1/2X4 (GAUZE/BANDAGES/DRESSINGS) IMPLANT
SUT VIC AB 1 CT1 18XBRD ANBCTR (SUTURE) ×2 IMPLANT
SUT VIC AB 1 CT1 8-18 (SUTURE) ×4
SUT VIC AB 2-0 CP2 18 (SUTURE) ×6 IMPLANT
SUT VIC AB 3-0 SH 8-18 (SUTURE) ×9 IMPLANT
TOWEL GREEN STERILE (TOWEL DISPOSABLE) ×6 IMPLANT
TOWEL GREEN STERILE FF (TOWEL DISPOSABLE) ×3 IMPLANT
TRAY FOLEY MTR SLVR 16FR STAT (SET/KITS/TRAYS/PACK) ×3 IMPLANT
TUBE MAZOR SA REDUCTION (TUBING) ×3 IMPLANT
WATER STERILE IRR 1000ML POUR (IV SOLUTION) ×3 IMPLANT

## 2018-02-02 NOTE — Progress Notes (Signed)
  Patient ID: Theresa Shannon, female   DOB: Sep 20, 1945, 72 y.o.   MRN: [id number] Vital signs are stable Patient is alert and oriented Motor function appears intact Incisions are clean and dry

## 2018-02-02 NOTE — Anesthesia Procedure Notes (Signed)
Procedure Name: Intubation Date/Time: 02/02/2018 7:50 AM Performed by: Josephine Igo, MD Pre-anesthesia Checklist: Patient identified, Emergency Drugs available, Suction available and Patient being monitored Patient Re-evaluated:Patient Re-evaluated prior to induction Oxygen Delivery Method: Circle system utilized Preoxygenation: Pre-oxygenation with 100% oxygen Induction Type: IV induction Ventilation: Mask ventilation without difficulty Laryngoscope Size: Mac and 4 Grade View: Grade II Tube type: Oral Tube size: 7.0 mm Number of attempts: 2 Airway Equipment and Method: Stylet Placement Confirmation: ETT inserted through vocal cords under direct vision,  positive ETCO2 and breath sounds checked- equal and bilateral Secured at: 21 cm Tube secured with: Tape Dental Injury: Teeth and Oropharynx as per pre-operative assessment  Comments: DL x 1 with Miller 3, G1 view, unable to pass ETT due to dropping of epiglottis, DL with MAC 4, GIIb view, intubation successful.

## 2018-02-02 NOTE — Transfer of Care (Signed)
Immediate Anesthesia Transfer of Care Note  Patient: Theresa Shannon  Procedure(s) Performed: Anterolateral decompression Lumbar Four-Five with xlif percutaneous fixation with robotic placement of screws Lumbar four-five (N/A Spine Lumbar) LUMBAR PERCUTANEOUS PEDICLE SCREW LUMBAR FOUR-FIVE (N/A Spine Lumbar) APPLICATION OF ROBOTIC ASSISTANCE FOR SPINAL PROCEDURE (N/A Spine Lumbar)  Patient Location: PACU  Anesthesia Type:General  Level of Consciousness: awake  Airway & Oxygen Therapy: Patient Spontanous Breathing and Patient connected to nasal cannula oxygen  Post-op Assessment: Report given to RN, Post -op Vital signs reviewed and stable, Patient moving all extremities and Patient moving all extremities X 4  Post vital signs: Reviewed and stable  Last Vitals:  Vitals Value Taken Time  BP 158/74 02/02/2018 11:30 AM  Temp    Pulse 70 02/02/2018 11:32 AM  Resp 16 02/02/2018 11:32 AM  SpO2 98 % 02/02/2018 11:32 AM  Vitals shown include unvalidated device data.  Last Pain:  Vitals:   02/02/18 0643  TempSrc:   PainSc: 4       Patients Stated Pain Goal: 4 (36/14/43 1540)  Complications: No apparent anesthesia complications

## 2018-02-02 NOTE — Anesthesia Procedure Notes (Signed)
Arterial Line Insertion Start/End7/29/2019 7:15 AM, 02/02/2018 7:25 AM Performed by: Josephine Igo, MD, Oletta Lamas, CRNA, CRNA  Patient location: Pre-op. Preanesthetic checklist: patient identified, IV checked, site marked, risks and benefits discussed, surgical consent, monitors and equipment checked, pre-op evaluation, timeout performed and anesthesia consent Lidocaine 1% used for infiltration Right, radial was placed Catheter size: 20 G Hand hygiene performed , maximum sterile barriers used  and Seldinger technique used Allen's test indicative of satisfactory collateral circulation Attempts: 1 Procedure performed without using ultrasound guided technique. Following insertion, dressing applied and Biopatch. Post procedure assessment: normal  Patient tolerated the procedure well with no immediate complications. Additional procedure comments: Performed by Daniel Nones.

## 2018-02-02 NOTE — Anesthesia Postprocedure Evaluation (Signed)
Anesthesia Post Note  Patient: Theresa Shannon  Procedure(s) Performed: Anterolateral decompression Lumbar Four-Five with xlif percutaneous fixation with robotic placement of screws Lumbar four-five (N/A Spine Lumbar) LUMBAR PERCUTANEOUS PEDICLE SCREW LUMBAR FOUR-FIVE (N/A Spine Lumbar) APPLICATION OF ROBOTIC ASSISTANCE FOR SPINAL PROCEDURE (N/A Spine Lumbar)     Patient location during evaluation: PACU Anesthesia Type: General Level of consciousness: awake and alert Pain management: pain level controlled Vital Signs Assessment: post-procedure vital signs reviewed and stable Respiratory status: spontaneous breathing, nonlabored ventilation, respiratory function stable and patient connected to nasal cannula oxygen Cardiovascular status: blood pressure returned to baseline and stable Postop Assessment: no apparent nausea or vomiting Anesthetic complications: no    Last Vitals:  Vitals:   02/02/18 1145 02/02/18 1200  BP: (!) 147/68 (!) 114/95  Pulse: 66 66  Resp: 13 20  Temp:    SpO2: 100% 99%    Last Pain:  Vitals:   02/02/18 1200  TempSrc:   PainSc: 4                  Keyairra Kolinski A.

## 2018-02-02 NOTE — Progress Notes (Signed)
Called Dr. Clarice Pole room regarding patient having syncope with taking percocet, the pain med that was ordered for her. Dr. Ellene Route wants her to take this med for pain if she needs it because she will be lying down.

## 2018-02-02 NOTE — Op Note (Signed)
Date of surgery: 02/02/2018 Preoperative diagnosis: Spondylolisthesis L4-L5 with lumbar radiculopathy Postoperative diagnosis: Same Procedure: Anterolateral decompression L4-L5 with placement of X LIF  spacer with 10 degrees lordosis 10 mm in height 22 mm in width 55 mm in diameter.  Percutaneous pedicle screw fixation using robotic assistance L4-L5 with neural monitoring throughout the entirety of the case.  Surgeon: Kristeen Miss First assistant: Deri Fuelling, MD Anesthesia: General endotracheal Indications: Theresa Shannon is a 72 year old individual who has had significant spondylolisthesis at L4-L5 she has lumbar radiculopathy and was advised regarding surgical decompression using an indirect technique. Procedure: Patient was brought to the operating room supine on the stretcher.  After the smooth induction of general endotracheal anesthesia she was turned into the right lateral decubitus position and the bony prominences were appropriately padded and protected.  Orthogonal x-rays were obtained to check the positioning of L4-L5 table was broken appropriately to allow exposure of L4-L5 over the iliac crest on the right side.  The area of entry was marked and then the skin on the left side was prepped with alcohol DuraPrep and draped in sterile fashion.  2 incisions were created 1 over the trajectory site on the lateral approach and the second 1 posteriorly in the flank to localize the retroperitoneum.  Then a probe was passed into the retroperitoneal space and into the psoas muscle at the level of L4-L5 using fluoroscopic guidance.  A K wire was placed into the lateral aspect of the vertebrae after neural monitoring confirmed that no roots were in the region.  A series of dilators was then passed over the K wire to allow placement of 130 mm deep lateral retractor.  Neuro monitoring was again performed to make sure that there is no roots in the immediate area posterior to the retractor to allow placement of  a shim into the interspace and disc space at L4-L5.  This was performed and the retractor was secured.  With this then the retractor was dilated to allow visualization of the lateral aspect of the disc space.  Soft tissues were cleared using a bipolar cautery.  The disc space was entered with a #15 blade and a series of curettes and rongeurs was used to evacuate a significant quantity of severely degenerated desiccated disc material.  Then a series of dilators was passed into the interspace this allowed better decortication of the endplates.  Once the endplates were adequately decorticated the interspace was sized for a 10 mm tall 10 degree lordotic 55 mm wide by 22 mm AP dimension spacer which was filled with allograft and infuse.  This was placed into the interspace under direct fluoroscopic guidance.  Once placed appropriately final radiographs were obtained in AP and lateral projection and showed good distraction of the disc space reduction of the spondylolisthesis and alignment of the vertebrae.  With this the lateral incisions were closed with 2-0 Vicryl in a fashion 3-0 Vicryl subcuticularly.  The second part of the procedure was done with the patient turned prone onto a [place] table.  The back was prepped with alcohol DuraPrep and draped in a sterile fashion.  A singular pin was placed in the left posterior superior iliac spine and the robotic arm was attached to this pin.  Then registration radiographs were obtained the robotic plan was confirmed and sites were chosen at L4 and L5 for placement of the pedicle screws.  2 linear incisions were created over the chosen entry sites.  The robotic arm was then used to guide placement  of K wires into the pedicles of L4 and L5.  Then at L4 6.5 x 50 mm screws were placed and at L5 6.5 x 45 mm screws were placed over the K wires.  40 mm precontoured rods were then used to connect the screws together.  This was done in a neutral construct.  Once the system was  torqued down the heads were removed final radiographs were obtained in AP and lateral projection.  The fascia was then closed with 2-0 Vicryl interrupted fashion and 3-0 Vicryl was used subcuticularly.  Blood loss for the entire the procedure was estimated less than 75 cc.  Patient tolerated procedure well is returned to recovery room stable condition.

## 2018-02-02 NOTE — Progress Notes (Addendum)
Inpatient Diabetes Program Recommendations  AACE/ADA: New Consensus Statement on Inpatient Glycemic Control (2019)  Target Ranges:  Prepandial:   less than 140 mg/dL      Peak postprandial:   less than 180 mg/dL (1-2 hours)      Critically ill patients:  140 - 180 mg/dL   Results for SHADASIA, OLDFIELD (MRN [id number]) as of 02/02/2018 11:40  Ref. Range 02/02/2018 05:57 02/02/2018 11:33  Glucose-Capillary Latest Ref Range: 70 - 99 mg/dL 162 (H) 195 (H)  Results for SHIRRELL, SOLINGER (MRN [id number]) as of 02/02/2018 11:40  Ref. Range 01/26/2018 10:23  Hemoglobin A1C Latest Ref Range: 4.8 - 5.6 % 8.0 (H)   Review of Glycemic Control  Diabetes history: DM2 Outpatient Diabetes medications: Metformin 500 mg BID Current orders for Inpatient glycemic control: None  Inpatient Diabetes Program Recommendations: Correction (SSI): If patient will be admitted, please consider ordering CBGs with Novolog 0-9 units TID with meals and Novolog 0-5 units QHS.  Addendum 02/02/18@13 :18-Noted consult for Diabetes Coordinator for post op management. Patient received Decadron 4 mg @ 8:02 am.  Patient is currently in PACU room 9. Called PACU and talked with Lenna Sciara, RN and asked that Dr. Ellene Route be contacted to see if recommendations for CBGs and Novolog correction can be ordered. Will continue to follow and make further recommendations for glycemic control if needed.  Thanks, Barnie Alderman, RN, MSN, CDE Diabetes Coordinator Inpatient Diabetes Program [phone] (Team Pager from 8am to 5pm)

## 2018-02-02 NOTE — H&P (Signed)
CHIEF COMPLAINT: Back problems with pain in the hips, down into the legs and feet, more on the right than on the left.  HISTORY OF PRESENT ILLNESS: Theresa Shannon is a 72 year old right-handed individual who has had problems now for a little over a year period of time. She notes that a little over a year ago she was dodging a foul ball at a baseball game for 1 of her grandchildren and she notes that she developed significant back pain. She subsequently started developing pain in the region of the left leg and then also pain in the right leg, mostly in the region of the lateral aspect of the hip and the anterior thigh. The pain on the right side has persisted and has only gotten considerably worse. She had been seen and evaluated by her primary care doc and ultimately underwent an MRI of the lumbar spine that was performed recently. This demonstrates that there is a slight anterolisthesis of L4 and L5 and she has a mild broad-based bulge of the disc extraforaminally on the right side at L4-L5. The spondylolisthesis was noted on some plain x-rays that she had done last year, but today in the office, I obtained a lateral flexion-extension film of the lumbar spine which demonstrates that she has approximately 5 mm of anterolisthesis in flexion at the L4-L5 level with reduction in extension. I believe that this instability is creating substantial difficulties with ongoing radicular pain for her. She notes that she had an epidural injection by Dr. Suella Broad, which gave some relief, but the pain seems to be recurring consistently now.  PAST MEDICAL HISTORY: Reveals that her general health has been fair. 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  CURRENT MEDICATIONS: Include Levothyroxine, Triamterene, Hydrochlorothiazide, low-dose Aspirin Fenofibrate, Atenolol, Metformin, Restasis. She notes an allergy or intolerance to Oxycodone, which she notes is too  strong for her.  SYSTEMS REVIEW: Notable in the form of swelling in the feet and hands, leg pain while walking, abdominal pain, leg pain, leg weakness and back pain on a 14-point review sheet.  PHYSICAL EXAMINATION: I note that she walks with a slightly widened base to her gait and she has some weakness in the quadriceps bilaterally at 4+ out of 5. Her patellae and her Achilles reflexes are both absent.  IMPRESSION: The patient has evidence of an advanced spondylolisthesis at the L4-L5 level. She has evidence of a broad-based extraforaminal protrusion of the disc on the right side at L4-5, but given the instability, I believe that ultimately she will require stabilization of L4-L5. I noted that this can be done with a minimally invasive approach by doing an anterolateral decompression, placing a spacer in the intervertebral space, and then percutaneously placing screws at L4 and L5, and fixing her lumbar spine in that region. The surgery itself would require at least an overnight stay and possibly 2 day stay in the hospital. Thereafter, she would be up and about with the use of an external corset. I am hopeful that the back pain and leg pain should substantially be improved. At this point, I am not certain that any further conservative effort for this problem is likely to yield much improvement as the syndrome has been going on for over a year's period of time despite conservative efforts. We can plan on scheduling the surgery at her earliest convenience. We will obtain a CT scan of the lumbar spine with a robotic protocol so  that we can use this assistance during the placement of the hardware.

## 2018-02-03 ENCOUNTER — Encounter (HOSPITAL_COMMUNITY): Payer: Self-pay | Admitting: Neurological Surgery

## 2018-02-03 DIAGNOSIS — M4316 Spondylolisthesis, lumbar region: Secondary | ICD-10-CM | POA: Diagnosis not present

## 2018-02-03 LAB — POCT I-STAT 4, (NA,K, GLUC, HGB,HCT)
Glucose, Bld: 174 mg/dL — ABNORMAL HIGH (ref 70–99)
HCT: 30 % — ABNORMAL LOW (ref 36.0–46.0)
HEMOGLOBIN: 10.2 g/dL — AB (ref 12.0–15.0)
POTASSIUM: 3.6 mmol/L (ref 3.5–5.1)
Sodium: 138 mmol/L (ref 135–145)

## 2018-02-03 LAB — GLUCOSE, CAPILLARY
GLUCOSE-CAPILLARY: 156 mg/dL — AB (ref 70–99)
Glucose-Capillary: 170 mg/dL — ABNORMAL HIGH (ref 70–99)

## 2018-02-03 MED ORDER — METHOCARBAMOL 500 MG PO TABS: 500.0000 mg | ORAL_TABLET | Freq: Four times a day (QID) | ORAL | 3 refills | Status: DC | PRN

## 2018-02-03 MED ORDER — HYDROCODONE-ACETAMINOPHEN 5-325 MG PO TABS: 1.0000 | ORAL_TABLET | ORAL | 0 refills | Status: DC | PRN

## 2018-02-03 NOTE — Progress Notes (Signed)
Patient is discharged from room 3C07 at this time. Alert and in stable condition. IV site d/c'd and instructions read to patient and family with understanding verbalized. Left unit via wheelchair with all belongings at side.  

## 2018-02-03 NOTE — Evaluation (Addendum)
Occupational Therapy Evaluation and Discharge Patient Details Name: Theresa Shannon MRN: [id number] DOB: 1946/04/07 Today's Date: 02/03/2018    History of Present Illness Pt is a 72 y/o female who presents s/p L4-L5 XLIF on 02/02/18. PMH significant for thyroid goiter, CVA (R eye only), peripheral neuropathy, HTN, hiatal hernia, gout, DMII, CKD III.   Clinical Impression   PTA, pt was independent with ADL and fucntional mobility. She currently requires supervision for functional mobiltiy during ADL transfers, min guard assist for tub/shower transfers, and min assist for LB ADL. Educated pt and family concerning back precautions related to ADL participation, brace wear schedule, do not scrub over incision, and safe home set-up for safety post-acute D/C. Discussed use of AE to assist with LB ADL but pt is more comfortable with having family assist. She and her family demonstrate understanding of all education topics and family is able to provide all necessary assistance safely. No further acute OT needs identified. OT will sign off.     Follow Up Recommendations  No OT follow up;Supervision/Assistance - 24 hour(initial assistance)    Equipment Recommendations  Tub/shower seat    Recommendations for Other Services       Precautions / Restrictions Precautions Precautions: Back;Fall Precaution Booklet Issued: Yes (comment) Precaution Comments: Reviewed handout with pt and she was cued for precautions during functional mobility.  Required Braces or Orthoses: Spinal Brace Spinal Brace: Lumbar corset;Applied in sitting position Restrictions Weight Bearing Restrictions: No      Mobility Bed Mobility Overal bed mobility: Needs Assistance Bed Mobility: Rolling;Sidelying to Sit Rolling: Min guard Sidelying to sit: Min guard       General bed mobility comments: VC's for sequencing and general safety with maintenance of precautions.   Transfers Overall transfer level: Needs  assistance Equipment used: None Transfers: Sit to/from Stand Sit to Stand: Supervision         General transfer comment: Close supervision for safety as pt powered up to full standing position. Minimal unsteadiness noted however no assist required to recover.     Balance Overall balance assessment: Needs assistance Sitting-balance support: Feet supported;No upper extremity supported Sitting balance-Leahy Scale: Good     Standing balance support: No upper extremity supported;During functional activity Standing balance-Leahy Scale: Fair                             ADL either performed or assessed with clinical judgement   ADL Overall ADL's : Needs assistance/impaired Eating/Feeding: Independent   Grooming: Supervision/safety;Standing Grooming Details (indicate cue type and reason): Educated on 2 cup method for oral care at the sink.  Upper Body Bathing: Set up;Sitting   Lower Body Bathing: Minimal assistance;Sit to/from stand   Upper Body Dressing : Set up;Sitting   Lower Body Dressing: Minimal assistance;Sit to/from stand Lower Body Dressing Details (indicate cue type and reason): Discussed potential use of AE; however, pt more comfortable with husband assisting with LB ADL.  Toilet Transfer: Supervision/safety;Ambulation;Comfort height toilet;Grab bars   Toileting- Clothing Manipulation and Hygiene: Supervision/safety;Sit to/from stand Toileting - Clothing Manipulation Details (indicate cue type and reason): Educated concerning use of wet-wipes.  Tub/ Shower Transfer: Tub transfer;Min guard;Ambulation;Shower seat;Grab bars   Functional mobility during ADLs: Supervision/safety General ADL Comments: Educated pt concerning brace wear schedule, compensatory strategies for ADL participation while adhering to back precautions, and safe tub/shower transfers.      Vision         Perception  Praxis      Pertinent Vitals/Pain Pain Assessment: Faces Faces  Pain Scale: Hurts little more Pain Location: Incision site/low back Pain Descriptors / Indicators: Operative site guarding;Discomfort Pain Intervention(s): Monitored during session;Repositioned     Hand Dominance     Extremity/Trunk Assessment Upper Extremity Assessment Upper Extremity Assessment: Overall WFL for tasks assessed   Lower Extremity Assessment Lower Extremity Assessment: Defer to PT evaluation RLE Deficits / Details: Decreased strength and muscular endurance consistent with pre-op diagnosis   Cervical / Trunk Assessment Cervical / Trunk Assessment: Other exceptions Cervical / Trunk Exceptions: s/p surgery   Communication Communication Communication: No difficulties   Cognition Arousal/Alertness: Awake/alert Behavior During Therapy: WFL for tasks assessed/performed Overall Cognitive Status: Within Functional Limits for tasks assessed                                     General Comments  Husband, son-in-law, and granddaughter present for majority of session. They stepped out during toileting and dressing.     Exercises     Shoulder Instructions      Home Living Family/patient expects to be discharged to:: Private residence Living Arrangements: Spouse/significant other Available Help at Discharge: Family;Available 24 hours/day Type of Home: House Home Access: Stairs to enter [employer] of Steps: 1   Home Layout: One level     Bathroom Shower/Tub: Teacher, early years/pre: Handicapped height     Home Equipment: Environmental consultant - 2 wheels;Shower seat          Prior Functioning/Environment Level of Independence: Independent                 OT Problem List: Decreased strength;Decreased activity tolerance;Impaired balance (sitting and/or standing);Decreased safety awareness;Decreased knowledge of use of DME or AE;Decreased knowledge of precautions;Pain      OT Treatment/Interventions:      OT Goals(Current goals  can be found in the care plan section) Acute Rehab OT Goals Patient Stated Goal: "I think I'm going home today." OT Goal Formulation: With patient/family  OT Frequency:     Barriers to D/C:            Co-evaluation              AM-PAC PT "6 Clicks" Daily Activity     Outcome Measure Help from another person eating meals?: None Help from another person taking care of personal grooming?: None Help from another person toileting, which includes using toliet, bedpan, or urinal?: None Help from another person bathing (including washing, rinsing, drying)?: A Little Help from another person to put on and taking off regular upper body clothing?: None Help from another person to put on and taking off regular lower body clothing?: A Little 6 Click Score: 22   End of Session Equipment Utilized During Treatment: Back brace Nurse Communication: Mobility status(OT complete)  Activity Tolerance: Patient tolerated treatment well Patient left: in chair;with call bell/phone within reach;with family/visitor present  OT Visit Diagnosis: Other abnormalities of gait and mobility (R26.89);Pain Pain - part of body: (back)                Time: 2536-6440 OT Time Calculation (min): 24 min Charges:  OT General Charges $OT Visit: 1 Visit OT Evaluation $OT Eval Moderate Complexity: 1 Mod OT Treatments $Self Care/Home Management : 8-22 mins  Norman Herrlich, MS OTR/L  Pager: [place] A Marae Cottrell 02/03/2018, 10:15 AM

## 2018-02-03 NOTE — Evaluation (Signed)
Physical Therapy Evaluation Patient Details Name: Theresa Shannon MRN: [id number] DOB: [birth year] Today's Date: 02/03/2018   History of Present Illness  Pt is a 72 y/o female who presents s/p L4-L5 XLIF on 02/02/18. PMH significant for thyroid goiter, CVA (R eye only), peripheral neuropathy, HTN, hiatal hernia, gout, DMII, CKD III.  Clinical Impression  Pt admitted with above diagnosis. Pt currently with functional limitations due to the deficits listed below (see PT Problem List). At the time of PT eval pt was able to perform transfers and ambulation with gross min guard assist progressing to supervision for safety by end of session. Overall pt mobilizing well with minimal limitations due to pain and decreased balance. Pt was educated on brace application/wearing schedule, car transfer, and safe activity progression. Pt will benefit from skilled PT to increase their independence and safety with mobility to allow discharge to the venue listed below.       Follow Up Recommendations No PT follow up;Supervision - Intermittent    Equipment Recommendations  None recommended by PT    Recommendations for Other Services       Precautions / Restrictions Precautions Precautions: Back;Fall Precaution Booklet Issued: Yes (comment) Precaution Comments: Reviewed handout with pt and she was cued for precautions during functional mobility.  Required Braces or Orthoses: Spinal Brace Spinal Brace: Lumbar corset;Applied in sitting position Restrictions Weight Bearing Restrictions: No      Mobility  Bed Mobility Overal bed mobility: Needs Assistance Bed Mobility: Rolling;Sidelying to Sit Rolling: Min guard Sidelying to sit: Min guard       General bed mobility comments: VC's for sequencing and general safety with maintenance of precautions.   Transfers Overall transfer level: Needs assistance Equipment used: None Transfers: Sit to/from Stand Sit to Stand: Supervision         General  transfer comment: Close supervision for safety as pt powered up to full standing position. Minimal unsteadiness noted however no assist required to recover.   Ambulation/Gait Ambulation/Gait assistance: Min guard;Supervision Gait Distance (Feet): 225 Feet Assistive device: None Gait Pattern/deviations: Step-through pattern;Decreased stride length Gait velocity: Decreased Gait velocity interpretation: 1.31 - 2.62 ft/sec, indicative of limited community ambulator General Gait Details: Slow but generally steady gait. Initially min guard assist provided however progressed to supervision for safety.   Stairs Stairs: Yes Stairs assistance: Min guard Stair Management: One rail Right;Alternating pattern;Forwards Number of Stairs: 10 General stair comments: VC's for step-to pattern however pt able to demonstrate alternating pattern without difficulty.   Wheelchair Mobility    Modified Rankin (Stroke Patients Only)       Balance Overall balance assessment: Needs assistance Sitting-balance support: Feet supported;No upper extremity supported Sitting balance-Leahy Scale: Good     Standing balance support: No upper extremity supported;During functional activity Standing balance-Leahy Scale: Fair                               Pertinent Vitals/Pain Pain Assessment: Faces Faces Pain Scale: Hurts even more Pain Location: Incision site/low back Pain Descriptors / Indicators: Operative site guarding;Discomfort Pain Intervention(s): Limited activity within patient's tolerance;Monitored during session;Repositioned    Home Living Family/patient expects to be discharged to:: Private residence Living Arrangements: Spouse/significant other Available Help at Discharge: Family;Available 24 hours/day Type of Home: House Home Access: Stairs to enter   [employer] of Steps: 1 Home Layout: One level Home Equipment: Walker - 2 wheels;Shower seat      Prior Function Level  of Independence: Independent               Hand Dominance        Extremity/Trunk Assessment   Upper Extremity Assessment Upper Extremity Assessment: Defer to OT evaluation    Lower Extremity Assessment Lower Extremity Assessment: RLE deficits/detail RLE Deficits / Details: Decreased strength and muscular endurance consistent with pre-op diagnosis    Cervical / Trunk Assessment Cervical / Trunk Assessment: Other exceptions Cervical / Trunk Exceptions: s/p surgery  Communication   Communication: No difficulties  Cognition Arousal/Alertness: Awake/alert Behavior During Therapy: WFL for tasks assessed/performed Overall Cognitive Status: Within Functional Limits for tasks assessed                                        General Comments      Exercises     Assessment/Plan    PT Assessment Patient needs continued PT services  PT Problem List Decreased strength;Decreased range of motion;Decreased activity tolerance;Decreased balance;Decreased mobility;Decreased knowledge of use of DME;Decreased safety awareness;Decreased knowledge of precautions;Pain       PT Treatment Interventions DME instruction;Gait training;Stair training;Functional mobility training;Therapeutic activities;Therapeutic exercise;Neuromuscular re-education;Patient/family education    PT Goals (Current goals can be found in the Care Plan section)  Acute Rehab PT Goals Patient Stated Goal: "I think I'm going home today." PT Goal Formulation: With patient/family Time For Goal Achievement: 02/10/18 Potential to Achieve Goals: Good    Frequency Min 5X/week   Barriers to discharge        Co-evaluation               AM-PAC PT "6 Clicks" Daily Activity  Outcome Measure Difficulty turning over in bed (including adjusting bedclothes, sheets and blankets)?: None Difficulty moving from lying on back to sitting on the side of the bed? : A Little Difficulty sitting down on and  standing up from a chair with arms (e.g., wheelchair, bedside commode, etc,.)?: A Little Help needed moving to and from a bed to chair (including a wheelchair)?: A Little Help needed walking in hospital room?: A Little Help needed climbing 3-5 steps with a railing? : A Little 6 Click Score: 19    End of Session Equipment Utilized During Treatment: Gait belt;Back brace Activity Tolerance: Patient tolerated treatment well Patient left: in chair;with call bell/phone within reach;with family/visitor present Nurse Communication: Mobility status PT Visit Diagnosis: Unsteadiness on feet (R26.81);Pain Pain - part of body: (incision site)    Time: 3244-0102 PT Time Calculation (min) (ACUTE ONLY): 28 min   Charges:   PT Evaluation $PT Eval Moderate Complexity: 1 Mod PT Treatments $Gait Training: 8-22 mins        Rolinda Roan, PT, DPT Acute Rehabilitation Services Pager: [phone]   Thelma Comp 02/03/2018, 9:49 AM

## 2018-02-03 NOTE — Progress Notes (Signed)
  Patient ID: Theresa Shannon, female   DOB: 04/19/1946, 72 y.o.   MRN: [id number] Signs are stable Motor function appears good save for left iliopsoas weakness Incisions are clean and dry Patient appears to be tolerating postop course well and will be discharged today

## 2018-02-03 NOTE — Discharge Summary (Signed)
Physician Discharge Summary  Patient ID: Theresa Shannon MRN: [id number] DOB/AGE: 08-Apr-1946 72 y.o.  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  She tolerated surgery well.  Consults: None  Significant Diagnostic Studies: None  Treatments: surgery: Anterolateral decompression L4-L5 with ex lift spacer pedicle screw fixation L4-L5 with robotic assistance and screw placement.  Discharge Exam: Blood pressure 114/66, pulse (!) 55, temperature 98.2 F (36.8 C), temperature source Oral, resp. rate 17, height 5\' 3"  (1.6 m), weight 82.8 kg (182 lb 8 oz), SpO2 99 %. Incision is clean and dry.  Motor function is good left iliopsoas weakness 4- out of 5.  Disposition: Discharge disposition: 01-Home or Self Care       Discharge Instructions    Call MD for:  redness, tenderness, or signs of infection (pain, swelling, redness, odor or green/yellow discharge around incision site)   Complete by:  As directed    Call MD for:  severe uncontrolled pain   Complete by:  As directed    Call MD for:  temperature >100.4   Complete by:  As directed    Diet - low sodium heart healthy   Complete by:  As directed    Discharge instructions   Complete by:  As directed    Okay to shower. Do not apply salves or appointments to incision. No heavy lifting with the upper extremities greater than 15 pounds. May resume driving when not requiring pain medication and patient feels comfortable with doing so.   Incentive spirometry RT   Complete by:  As directed    Increase activity slowly   Complete by:  As directed       Allergies as of 02/03/2018      Reactions   Oxycodone-acetaminophen Other (See Comments)   REACTION: Syncope      Medication List    TAKE these medications   acetaminophen 325 MG tablet Commonly known as:  TYLENOL Take 650 mg by mouth every 6 (six) hours as needed for mild pain.   aspirin 81 MG chewable tablet Chew 81 mg by mouth daily.   atenolol 50 MG tablet Commonly known as:  TENORMIN Take 1 tablet (50 mg total) by mouth daily.   cholecalciferol 1000 units tablet Commonly known as:  VITAMIN D Take 1,000 Units by mouth daily.   cycloSPORINE 0.05 % ophthalmic emulsion Commonly known as:  RESTASIS Place 1 drop into both eyes 2 (two) times daily.   fenofibrate 145 MG tablet Commonly known as:  TRICOR TAKE 1 TABLET BY MOUTH EVERY DAY   HYDROcodone-acetaminophen 5-325 MG tablet Commonly known as:  NORCO/VICODIN Take 1-2 tablets by mouth every 4 (four) hours as needed for severe pain.   levothyroxine 175 MCG tablet Commonly known as:  SYNTHROID, LEVOTHROID Take 175 mcg by mouth daily before breakfast.   metFORMIN 500 MG tablet Commonly known as:  GLUCOPHAGE Take 500 mg by mouth 2 (two) times daily with a meal.   methocarbamol 500 MG tablet Commonly known as:  ROBAXIN Take 1 tablet (500 mg total) by mouth every 6 (six) hours as needed for muscle spasms.   triamterene-hydrochlorothiazide 37.5-25 MG tablet Commonly known as:  MAXZIDE-25 Take 1 tablet by mouth every morning.  SignedEarleen Newport 02/03/2018, 10:15 AM

## 2018-02-03 NOTE — Discharge Instructions (Signed)
Wound Care °Leave incision open to air. °You may shower. °Do not scrub directly on incision.  °Do not put any creams, lotions, or ointments on incision. °Activity °Walk each and every day, increasing distance each day. °No lifting greater than 5 lbs.  Avoid excessive neck motion. °No driving for 2 weeks; may ride as a passenger locally. °Wear neck brace at all times except when showering.  If provided soft collar, may wear for comfort unless otherwise instructed. °Diet °Resume your normal diet.  °Return to Work °Will be discussed at you follow up appointment. °Call Your Doctor If Any of These Occur °Redness, drainage, or swelling at the wound.  °Temperature greater than 101 degrees. °Severe pain not relieved by pain medication. °Increased difficulty swallowing. °Incision starts to come apart. °Follow Up Appt °Call today for appointment in 1-2 weeks ([phone]) or for problems.  If you have any hardware placed in your spine, you will need an x-ray before your appointment. °

## 2018-02-03 NOTE — Progress Notes (Addendum)
Inpatient Diabetes Program Recommendations  AACE/ADA: New Consensus Statement on Inpatient Glycemic Control (2019)  Target Ranges:  Prepandial:   less than 140 mg/dL      Peak postprandial:   less than 180 mg/dL (1-2 hours)      Critically ill patients:  140 - 180 mg/dL   Results for Theresa Shannon, Theresa Shannon (MRN [id number]) as of 02/03/2018 07:46  Ref. Range 02/02/2018 05:57 02/02/2018 11:33 02/02/2018 17:03 02/02/2018 21:56 02/03/2018 06:26  Glucose-Capillary Latest Ref Range: 70 - 99 mg/dL 162 (H) 195 (H) 243 (H) 219 (H) 156 (H)   Results for [name], [name] (MRN [id number]) as of 02/02/2018 11:40  Ref. Range 01/26/2018 10:23  Hemoglobin A1C Latest Ref Range: 4.8 - 5.6 % 8.0 (H)   Review of Glycemic Control  Diabetes history: DM2 Outpatient Diabetes medications: Metformin 500 mg BID Current orders for Inpatient glycemic control: Novolog 0-9 units TID with meals, Novolog 0-5 units QHS, Metformin 500 mg BID  Inpatient Diabetes Program Recommendations: Correction (SSI):Please consider increasing Novolog to moderate scale (0-15 units) TID with meals. A1C: A1C 8% on 01/26/18 indicating an average glucose of 180 mg/dl. MD may want to consider increasing outpatient Metformin to 1000 mg BID if patient is able to tolerate.  NOTE: Noted patient received a one time dose of Decadron on 02/02/18 and no other steroids are ordered.   Thanks, Barnie Alderman, RN, MSN, CDE Diabetes Coordinator Inpatient Diabetes Program [phone] (Team Pager from 8am to 5pm)

## 2018-02-05 NOTE — Addendum Note (Signed)
Addendum  created 02/05/18 1056 by Oletta Lamas, CRNA   Cosign clinical note, Intraprocedure Staff edited

## 2018-02-11 ENCOUNTER — Other Ambulatory Visit (HOSPITAL_COMMUNITY): Payer: Self-pay | Admitting: Neurological Surgery

## 2018-02-11 ENCOUNTER — Ambulatory Visit (HOSPITAL_COMMUNITY)
Admission: RE | Admit: 2018-02-11 | Discharge: 2018-02-11 | Disposition: A | Discharge status: Home / Self Care | Payer: Federal, State, Local not specified - PPO | Source: Ambulatory Visit | Attending: Neurological Surgery | Admitting: Neurological Surgery

## 2018-02-11 DIAGNOSIS — R609 Edema, unspecified: Secondary | ICD-10-CM | POA: Diagnosis not present

## 2018-02-11 DIAGNOSIS — R600 Localized edema: Secondary | ICD-10-CM | POA: Insufficient documentation

## 2018-02-11 NOTE — Progress Notes (Signed)
Preliminary notes--Bilateral lower extremities venous duplex exam completed.  Negative for DVT. Result called ordering physician's office spoke with Deresha(Medical assistant). Patient was instructed to go home and follow the office visit. Hongying Tikia Skilton (RDMS RVT) 02/11/18 3:33 PM

## 2018-04-03 ENCOUNTER — Other Ambulatory Visit: Payer: Self-pay | Admitting: Cardiovascular Disease

## 2018-05-05 ENCOUNTER — Telehealth: Payer: Self-pay | Admitting: *Deleted

## 2018-05-05 NOTE — Telephone Encounter (Signed)
Cancelled appt for tomorrow and will call her tomorrow to reschedule.  She verbalized understanding.

## 2018-05-05 NOTE — Progress Notes (Deleted)
[facility]  PATIENT: Theresa Shannon DOB: 30-Apr-1946   REASON FOR VISIT: Follow-up for episodic  Headaches, new complaint of ear fullness, dizziness HISTORY FROM: Patient    HISTORY OF PRESENT ILLNESS:Theresa Shannon, 72 year old female returns for followup She has episodic headaches. She is well controlled on atenolol. 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 She is currently getting some physical therapy for back pain at [facility]. She also complains of ear fullness and dizziness. She denies seasonal allergies .She needs refills on her medication. .She returns for reevaluation  HISTORY:Ms Shannon is a 72 year old returns today for followup. She has been evaluated for headaches in the past, she has several different types of headaches. Her headaches date back to January of 2009. 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 She underwent a temporal artery biopsy, began having headaches at that time. She has a second type of headache that is more of a stabbing pain which can occur either in the right temporal area or around the right eye. She is unable to identify any precipitating factors, she has no associated redness of the eye, tearing of the eye or running of the nose. Valproic has not been helpful. She has not had any problems with daytime drowsiness and feels that she has restorative sleep. She has been on Lyrica in the past but did not find it to be beneficial. She has elevated blood pressures in the past and was started on atenolol, when last seen her blood pressure continued to be in the 469 range systolically and atenolol increased to 50 daily.     REVIEW OF SYSTEMS: Full 14 system review of systems performed and notable only for those listed, all others are  neg:  Constitutional: neg  Cardiovascular: neg Ear/Nose/Throat: neg  Skin: neg Eyes: neg Respiratory: neg Gastroitestinal: neg  Hematology/Lymphatic: neg  Endocrine: neg Musculoskeletal: Back pain Allergy/Immunology: neg Neurological: Dizziness Psychiatric: neg Sleep : neg   ALLERGIES: Allergies  Allergen Reactions  . Oxycodone-Acetaminophen Other (See Comments)    REACTION: Syncope    HOME MEDICATIONS: Outpatient Medications Prior to Visit  Medication Sig Dispense Refill  . acetaminophen (TYLENOL) 325 MG tablet Take 650 mg by mouth every 6 (six) hours as needed for mild pain.    Marland Kitchen aspirin 81 MG chewable tablet Chew 81 mg by mouth daily.    Marland Kitchen atenolol (TENORMIN) 50 MG tablet Take 1 tablet (50 mg total) by mouth daily. 30 tablet 11  . cholecalciferol (VITAMIN D) 1000 units tablet Take 1,000 Units by mouth daily.    . cycloSPORINE (RESTASIS) 0.05 % ophthalmic emulsion Place 1 drop into both eyes 2 (two) times daily.     . fenofibrate (TRICOR) 145 MG tablet Take 1 tablet (145 mg total) by mouth daily. Please make overdue yearly appt with Dr. Acie Fredrickson before anymore refills. 2nd attempt 15 tablet 0  . HYDROcodone-acetaminophen (NORCO/VICODIN) 5-325 MG tablet Take 1-2 tablets by mouth every 4 (four) hours as needed for severe pain. 60 tablet 0  . levothyroxine (SYNTHROID, LEVOTHROID) 175 MCG tablet Take 175 mcg by mouth daily before breakfast.    . metFORMIN (GLUCOPHAGE) 500 MG tablet Take 500 mg by mouth 2 (two) times daily with a meal.     . methocarbamol (ROBAXIN) 500 MG tablet Take 1 tablet (500 mg  total) by mouth every 6 (six) hours as needed for muscle spasms. 40 tablet 3  . triamterene-hydrochlorothiazide (MAXZIDE-25) 37.5-25 MG per tablet Take 1 tablet by mouth every morning.      No facility-administered medications prior to visit.     PAST MEDICAL HISTORY: Past Medical History:  Diagnosis Date  . Anemia   . Atypical chest pain    stress test 1/12-normal  . 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 Chronic kidney disease    hypertensive chronic kideny disease benign/ stage 3  . Diabetes mellitus, type 2 ([place])   . Esophagitis determined by endoscopy 03/2015  . GERD (gastroesophageal reflux disease)   . Gout 04/2015   L toe  . History of frequent urinary tract infections   . History of hiatal hernia   . History of kidney stones   . HTN (hypertension)   . Hyperlipemia   . Hypothyroidism   . Peripheral neuropathy   . Spondylolisthesis of lumbar region   . Stroke [facility])    in the eye only; right eye   . Thyroid goiter    multinodular   . Wears glasses     PAST SURGICAL HISTORY: Past Surgical History:  Procedure Laterality Date  . ABDOMINAL HYSTERECTOMY    . ABSCESS DRAINAGE  1995   breast  . ANTERIOR LAT LUMBAR FUSION N/A 02/02/2018   Procedure: Anterolateral decompression Lumbar Four-Five with xlif percutaneous fixation with robotic placement of screws Lumbar four-five;  Surgeon: Kristeen Miss, MD;  Location: [place];  Service: Neurosurgery;  Laterality: N/A;  . APPENDECTOMY    . APPLICATION OF ROBOTIC ASSISTANCE FOR SPINAL PROCEDURE N/A 02/02/2018   Procedure: APPLICATION OF ROBOTIC ASSISTANCE FOR SPINAL PROCEDURE;  Surgeon: Kristeen Miss, MD;  Location: [place];  Service: Neurosurgery;  Laterality: N/A;  . BREAST BIOPSY    . BREAST CYST EXCISION  02/26/2012   Procedure: CYST EXCISION BREAST;  Surgeon: Harl Bowie, MD;  Location: [place];  Service: General;  Laterality: Left;  excision chronic cyst left breast  . BUNIONECTOMY     LEFT  . CHOLECYSTECTOMY N/A 08/17/2015   Procedure: LAPAROSCOPIC CHOLECYSTECTOMY WITH INTRAOPERATIVE CHOLANGIOGRAM;  Surgeon: Armandina Gemma, MD;  [facility];  Service: General;  Laterality: N/A;  . DILATION AND CURETTAGE OF UTERUS    . KNEE SURGERY     BILATERAL  . LUMBAR PERCUTANEOUS PEDICLE SCREW 1 LEVEL N/A 02/02/2018   Procedure: LUMBAR PERCUTANEOUS PEDICLE SCREW LUMBAR FOUR-FIVE;   Surgeon: Kristeen Miss, MD;  Location: [place];  Service: Neurosurgery;  Laterality: N/A;  . TEMPORAL ARTERY BIOPSY / LIGATION    . TONSILLECTOMY AND ADENOIDECTOMY    . TUBAL LIGATION    . TUMOR REMOVAL     LEFT ARM-lipoma    FAMILY HISTORY: Family History  Problem Relation Age of Onset  . Dementia Father   . Heart disease Father   . Hypertension Father   . Stroke Father   . Dementia Mother   . Kidney disease Mother   . Cancer Mother   . Diabetes Mother     SOCIAL HISTORY: Social History   Socioeconomic History  . Marital status: Married    Spouse name: Elenore Rota  . Number of children: 1  . Years of education: 12th  . Highest education level: Not on file  Occupational History  . Occupation: Retired  Scientific laboratory technician  . Financial resource strain: Not on file  . Food insecurity:  Worry: Not on file    Inability: Not on file  . Transportation needs:    Medical: Not on file    Non-medical: Not on file  Tobacco Use  . Smoking status: Never Smoker  . Smokeless tobacco: Never Used  Substance and Sexual Activity  . Alcohol use: No  . Drug use: No  . Sexual activity: Not on file  Lifestyle  . Physical activity:    Days per week: Not on file    Minutes per session: Not on file  . Stress: Not on file  Relationships  . Social connections:    Talks on phone: Not on file    Gets together: Not on file    Attends religious service: Not on file    Active member of club or organization: Not on file    Attends meetings of clubs or organizations: Not on file    Relationship status: Not on file  . Intimate partner violence:    Fear of current or ex partner: Not on file    Emotionally abused: Not on file    Physically abused: Not on file    Forced sexual activity: Not on file  Other Topics Concern  . Not on file  Social History Narrative   Patient lives at home with husband Elenore Rota.    Patient has 1 child.    Patient is retired.    Patient has a high school education.       PHYSICAL EXAM  There were no vitals filed for this visit. There is no height or weight on file to calculate BMI. Generalized: Well developed, in no acute distress  Head large amount of cerumen in both ears Neurological examination  Mentation: Alert oriented to time, place, history taking. Follows all commands speech and language fluent  Cranial nerve II-XII: Pupils were equal round reactive to light extraocular movements were full, visual field were full on confrontational test. Facial sensation and strength were normal. hearing was intact to finger rubbing bilaterally. Uvula tongue midline. head turning and shoulder shrug and were normal and symmetric.Tongue protrusion into cheek strength was normal.  Motor: normal bulk and tone, full strength in the BUE, BLE, fine finger movements normal, no pronator drift. No focal weakness  Coordination: finger-nose-finger, heel-to-shin bilaterally, no dysmetria  Reflexes: 1+ upper lower and symmetric  Gait and Station: Rising up from seated position without assistance, normal stance, moderate stride, good arm swing, smooth turning, able to perform tiptoe, and heel walking without difficulty.    DIAGNOSTIC DATA (LABS, IMAGING, TESTING) - I reviewed patient records, labs, notes, testing and imaging myself where available.  Lab Results  Component Value Date   WBC 5.6 01/26/2018   HGB 10.2 (L) 02/02/2018   HCT 30.0 (L) 02/02/2018   MCV 95.2 01/26/2018   PLT 203 01/26/2018      Component Value Date/Time   NA 138 02/02/2018 1023   NA 141 02/14/2017 0825   K 3.6 02/02/2018 1023   CL 105 01/26/2018 1023   CO2 24 01/26/2018 1023   GLUCOSE 174 (H) 02/02/2018 1023   BUN 28 (H) 01/26/2018 1023   BUN 25 02/14/2017 0825   CREATININE 1.33 (H) 01/26/2018 1023   CALCIUM 9.4 01/26/2018 1023   PROT 6.9 02/14/2017 0825   ALBUMIN 4.2 02/14/2017 0825   AST 30 02/14/2017 0825   ALT 27 02/14/2017 0825   ALKPHOS 60 02/14/2017 0825   BILITOT 0.4  02/14/2017 0825   GFRNONAA 38 (L) 01/26/2018 1023   GFRAA  4 (L) 01/26/2018 1023    ASSESSMENT AND PLAN  72 y.o. year old female  has a past medical history of HTN (hypertension); Hyperlipemia; Hypothyroidism; Anemia;  Blindness and low vision; Diabetes mellitus, type 2 ([place]); and Episodic headaches here to follow-up. Her headaches are well controlled. She complains of dizziness and has bilateral cerumen  Continue atenolol 50 mg daily for headache management will refill for one year Given information on earwax and over-the-counter preparation Debrox  Follow-up yearly. I spent 42min  in total face to face time with the patient more than 50% of which was spent counseling and coordination of care, reviewing test results reviewing medications and discussing and reviewing the diagnosis of headache and earwax buildup and further treatment options. , Rayburn Ma, [facility], APRN  [facility] [address] [place], [place] [address] [phone]

## 2018-05-06 ENCOUNTER — Ambulatory Visit: Payer: Federal, State, Local not specified - PPO | Admitting: Nurse Practitioner

## 2018-05-06 NOTE — Telephone Encounter (Signed)
Rescheduled for 05-11-18 at 1115, arrive 1045 for check in. Pt verbalized understanding.

## 2018-05-08 NOTE — Progress Notes (Signed)
[facility]  PATIENT: Theresa Shannon DOB: [birth year]   REASON FOR VISIT: Follow-up for episodic  Headaches,  HISTORY FROM: Patient    HISTORY OF PRESENT ILLNESS:Theresa Shannon, 72year-old female returns for followup She has episodic headaches. She is well controlled on atenolol. 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 She had back surgery L4L% fusion July 29th she is due to start physical therapy next week .She needs refills on her medication. .She returns for reevaluation  HISTORY:Theresa Shannon is a 72 year old returns today for followup. She has been evaluated for headaches in the past, she has several different types of headaches. Her headaches date back to January of 2009. 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 She underwent a temporal artery biopsy, began having headaches at that time. She has a second type of headache that is more of a stabbing pain which can occur either in the right temporal area or around the right eye. She is unable to identify any precipitating factors, she has no associated redness of the eye, tearing of the eye or running of the nose. Valproic has not been helpful. She has not had any problems with daytime drowsiness and feels that she has restorative sleep. She has been on Lyrica in the past but did not find it to be beneficial. She has elevated blood pressures in the past and was started on atenolol, when last seen her blood pressure continued to be in the 400 range systolically and atenolol increased to 50 daily.     REVIEW OF SYSTEMS: Full 14 system review of systems performed and notable only for those listed, all others are neg:  Constitutional: neg  Cardiovascular: neg Ear/Nose/Throat: neg  Skin: neg Eyes:  neg Respiratory: neg Gastroitestinal: neg  Hematology/Lymphatic: neg  Endocrine: neg Musculoskeletal: Back pain resolved after L4-L5 fusion Allergy/Immunology: neg Neurological: Intermittent headache Psychiatric: neg Sleep : neg   ALLERGIES: Allergies  Allergen Reactions  . Oxycodone-Acetaminophen Other (See Comments)    REACTION: Syncope    HOME MEDICATIONS: Outpatient Medications Prior to Visit  Medication Sig Dispense Refill  . acetaminophen (TYLENOL) 325 MG tablet Take 650 mg by mouth every 6 (six) hours as needed for mild pain.    Marland Kitchen aspirin 81 MG chewable tablet Chew 81 mg by mouth daily.    Marland Kitchen atenolol (TENORMIN) 50 MG tablet Take 1 tablet (50 mg total) by mouth daily. 30 tablet 11  . cholecalciferol (VITAMIN D) 1000 units tablet Take 1,000 Units by mouth daily.    . cycloSPORINE (RESTASIS) 0.05 % ophthalmic emulsion Place 1 drop into both eyes 2 (two) times daily.     . fenofibrate (TRICOR) 145 MG tablet Take 1 tablet (145 mg total) by mouth daily. Please make overdue yearly appt with Dr. Acie Fredrickson before anymore refills. 2nd attempt 15 tablet 0  . levothyroxine (SYNTHROID, LEVOTHROID) 175 MCG tablet Take 175 mcg by mouth daily before breakfast.    . metFORMIN (GLUCOPHAGE) 500 MG tablet Take 500 mg by mouth 2 (two) times daily with a meal.     . olmesartan-hydrochlorothiazide (BENICAR HCT) 40-25 MG tablet daily.  3  . HYDROcodone-acetaminophen (NORCO/VICODIN) 5-325 MG tablet Take 1-2 tablets by mouth every 4 (four) hours as needed for severe pain. (Patient not taking: Reported on 05/11/2018) 60 tablet 0  .  methocarbamol (ROBAXIN) 500 MG tablet Take 1 tablet (500 mg total) by mouth every 6 (six) hours as needed for muscle spasms. (Patient not taking: Reported on 05/11/2018) 40 tablet 3  . triamterene-hydrochlorothiazide (MAXZIDE-25) 37.5-25 MG per tablet Take 1 tablet by mouth every morning.      No facility-administered medications prior to visit.     PAST MEDICAL  HISTORY: Past Medical History:  Diagnosis Date  . Anemia   . Atypical chest pain    stress test 1/12-normal  . 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 Chronic kidney disease    hypertensive chronic kideny disease benign/ stage 3  . Diabetes mellitus, type 2 ([place])   . Esophagitis determined by endoscopy 03/2015  . GERD (gastroesophageal reflux disease)   . Gout 04/2015   L toe  . History of frequent urinary tract infections   . History of hiatal hernia   . History of kidney stones   . HTN (hypertension)   . Hyperlipemia   . Hypothyroidism   . Peripheral neuropathy   . Spondylolisthesis of lumbar region   . Stroke [facility])    in the eye only; right eye   . Thyroid goiter    multinodular   . Wears glasses     PAST SURGICAL HISTORY: Past Surgical History:  Procedure Laterality Date  . ABDOMINAL HYSTERECTOMY    . ABSCESS DRAINAGE  1995   breast  . ANTERIOR LAT LUMBAR FUSION N/A 02/02/2018   Procedure: Anterolateral decompression Lumbar Four-Five with xlif percutaneous fixation with robotic placement of screws Lumbar four-five;  Surgeon: Kristeen Miss, MD;  Location: [place];  Service: Neurosurgery;  Laterality: N/A;  . APPENDECTOMY    . APPLICATION OF ROBOTIC ASSISTANCE FOR SPINAL PROCEDURE N/A 02/02/2018   Procedure: APPLICATION OF ROBOTIC ASSISTANCE FOR SPINAL PROCEDURE;  Surgeon: Kristeen Miss, MD;  Location: [place];  Service: Neurosurgery;  Laterality: N/A;  . BREAST BIOPSY    . BREAST CYST EXCISION  02/26/2012   Procedure: CYST EXCISION BREAST;  Surgeon: Harl Bowie, MD;  Location: [place];  Service: General;  Laterality: Left;  excision chronic cyst left breast  . BUNIONECTOMY     LEFT  . CHOLECYSTECTOMY N/A 08/17/2015   Procedure: LAPAROSCOPIC CHOLECYSTECTOMY WITH INTRAOPERATIVE CHOLANGIOGRAM;  Surgeon: Armandina Gemma, MD;  [facility];  Service: General;  Laterality: N/A;  . DILATION AND CURETTAGE OF UTERUS    . KNEE SURGERY     BILATERAL   . LUMBAR PERCUTANEOUS PEDICLE SCREW 1 LEVEL N/A 02/02/2018   Procedure: LUMBAR PERCUTANEOUS PEDICLE SCREW LUMBAR FOUR-FIVE;  Surgeon: Kristeen Miss, MD;  Location: [place];  Service: Neurosurgery;  Laterality: N/A;  . TEMPORAL ARTERY BIOPSY / LIGATION    . TONSILLECTOMY AND ADENOIDECTOMY    . TUBAL LIGATION    . TUMOR REMOVAL     LEFT ARM-lipoma    FAMILY HISTORY: Family History  Problem Relation Age of Onset  . Dementia Father   . Heart disease Father   . Hypertension Father   . Stroke Father   . Dementia Mother   . Kidney disease Mother   . Cancer Mother   . Diabetes Mother     SOCIAL HISTORY: Social History   Socioeconomic History  . Marital status: Married    Spouse name: Elenore Rota  . Number of children: 1  . Years of education: 12th  . Highest education level: Not on file  Occupational History  . Occupation: Retired  Science writer  Needs  . Financial resource strain: Not on file  . Food insecurity:    Worry: Not on file    Inability: Not on file  . Transportation needs:    Medical: Not on file    Non-medical: Not on file  Tobacco Use  . Smoking status: Never Smoker  . Smokeless tobacco: Never Used  Substance and Sexual Activity  . Alcohol use: No  . Drug use: No  . Sexual activity: Not on file  Lifestyle  . Physical activity:    Days per week: Not on file    Minutes per session: Not on file  . Stress: Not on file  Relationships  . Social connections:    Talks on phone: Not on file    Gets together: Not on file    Attends religious service: Not on file    Active member of club or organization: Not on file    Attends meetings of clubs or organizations: Not on file    Relationship status: Not on file  . Intimate partner violence:    Fear of current or ex partner: Not on file    Emotionally abused: Not on file    Physically abused: Not on file    Forced sexual activity: Not on file  Other Topics Concern  . Not on file  Social History Narrative   Patient lives  at home with husband Elenore Rota.    Patient has 1 child.    Patient is retired.    Patient has a high school education.      PHYSICAL EXAM  Vitals:   05/11/18 1055  BP: 127/72  Pulse: (!) 53  Weight: 181 lb 8 oz (82.3 kg)  Height: 5\' 3"  (1.6 m)   Body mass index is 32.15 kg/m. Generalized: Well developed, in no acute distress  Neurological examination  Mentation: Alert oriented to time, place, history taking. Follows all commands speech and language fluent  Cranial nerve II-XII: Pupils were equal round reactive to light extraocular movements were full, visual field were full on confrontational test. Facial sensation and strength were normal. hearing was intact to finger rubbing bilaterally. Uvula tongue midline. head turning and shoulder shrug and were normal and symmetric.Tongue protrusion into cheek strength was normal.  Motor: normal bulk and tone, full strength in the BUE, BLE, fine finger movements normal, no pronator drift. No focal weakness  Coordination: finger-nose-finger, heel-to-shin bilaterally, no dysmetria  Reflexes: 1+ upper lower and symmetric  Gait and Station: Rising up from seated position without assistance, normal stance, moderate stride, good arm swing, smooth turning, able to perform tiptoe, and heel walking without difficulty.    DIAGNOSTIC DATA (LABS, IMAGING, TESTING) - I reviewed patient records, labs, notes, testing and imaging myself where available.  Lab Results  Component Value Date   WBC 5.6 01/26/2018   HGB 10.2 (L) 02/02/2018   HCT 30.0 (L) 02/02/2018   MCV 95.2 01/26/2018   PLT 203 01/26/2018      Component Value Date/Time   NA 138 02/02/2018 1023   NA 141 02/14/2017 0825   K 3.6 02/02/2018 1023   CL 105 01/26/2018 1023   CO2 24 01/26/2018 1023   GLUCOSE 174 (H) 02/02/2018 1023   BUN 28 (H) 01/26/2018 1023   BUN 25 02/14/2017 0825   CREATININE 1.33 (H) 01/26/2018 1023   CALCIUM 9.4 01/26/2018 1023   PROT 6.9 02/14/2017 0825    ALBUMIN 4.2 02/14/2017 0825   AST 30 02/14/2017 0825   ALT 27 02/14/2017  0825   ALKPHOS 60 02/14/2017 0825   BILITOT 0.4 02/14/2017 0825   GFRNONAA 38 (L) 01/26/2018 1023   GFRAA 44 (L) 01/26/2018 1023    ASSESSMENT AND PLAN  72 y.o. year old female  has a past medical history of HTN (hypertension); Hyperlipemia; Hypothyroidism; Anemia;  Blindness and low vision; Diabetes mellitus, type 2 ([place]); and Episodic headaches here to follow-up. Her headaches are well controlled.    Continue atenolol 50 mg daily for headache management will refill for one year  Follow-up yearly. Dennie Bible, [facility], [facility], APRN  [facility] [address] [place], [place] [address] [phone]

## 2018-05-11 ENCOUNTER — Encounter: Payer: Self-pay | Admitting: Nurse Practitioner

## 2018-05-11 ENCOUNTER — Ambulatory Visit: Payer: Federal, State, Local not specified - PPO | Admitting: Nurse Practitioner

## 2018-05-11 VITALS — BP 127/72 | HR 53 | Ht 63.0 in | Wt 181.5 lb

## 2018-05-11 DIAGNOSIS — R519 Headache, unspecified: Secondary | ICD-10-CM

## 2018-05-11 DIAGNOSIS — R51 Headache: Secondary | ICD-10-CM

## 2018-05-11 MED ORDER — ATENOLOL 50 MG PO TABS: 50.0000 mg | ORAL_TABLET | Freq: Every day | ORAL | 3 refills | Status: DC

## 2018-05-11 NOTE — Patient Instructions (Signed)
Continue atenolol 50 mg daily for headache management will refill for one year  Follow-up yearly

## 2018-05-13 ENCOUNTER — Other Ambulatory Visit: Payer: Self-pay | Admitting: Nurse Practitioner

## 2018-05-20 NOTE — Progress Notes (Signed)
I have reviewed and agreed above plan. 

## 2019-04-26 ENCOUNTER — Other Ambulatory Visit: Payer: Self-pay | Admitting: *Deleted

## 2019-04-26 MED ORDER — ATENOLOL 50 MG PO TABS: 50.0000 mg | ORAL_TABLET | Freq: Every day | ORAL | 0 refills | Status: DC

## 2019-05-13 ENCOUNTER — Other Ambulatory Visit: Payer: Self-pay

## 2019-05-13 ENCOUNTER — Encounter: Payer: Self-pay | Admitting: Neurology

## 2019-05-13 ENCOUNTER — Ambulatory Visit: Payer: Medicare Other | Admitting: Neurology

## 2019-05-13 VITALS — BP 138/66 | HR 62 | Temp 97.9°F | Ht 63.0 in | Wt 188.5 lb

## 2019-05-13 DIAGNOSIS — R519 Headache, unspecified: Secondary | ICD-10-CM | POA: Diagnosis not present

## 2019-05-13 MED ORDER — ATENOLOL 50 MG PO TABS: 50.0000 mg | ORAL_TABLET | Freq: Every day | ORAL | 4 refills | Status: AC

## 2019-05-13 NOTE — Progress Notes (Signed)
[facility]  PATIENT: Theresa Shannon DOB: [birth year]  HISTORY OF PRESENT ILLNESS:  Theresa Shannon is a 73 year old returns today for followup. She has been evaluated for headaches in the past, she has several different types of headaches. Her headaches date back to January of 2009. 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 She underwent a temporal artery biopsy, began having headaches at that time. She has a second type of headache that is more of a stabbing pain which can occur either in the right temporal area or around the right eye. She is unable to identify any precipitating factors, she has no associated redness of the eye, tearing of the eye or running of the nose. Valproic has not been helpful. She has not had any problems with daytime drowsiness and feels that she has restorative sleep. She has been on Lyrica in the past but did not find it to be beneficial. She has elevated blood pressures in the past and was started on atenolol, when last seen her blood pressure continued to be in the [id number] range systolically and atenolol increased to 50 daily.    UPDATE Nov 5th 2020: She has been taking atenolol 50 mg daily over the years, rarely has headaches, responding well to Tylenol, she has diabetes, is followed up by her primary care physician 4 times each year   REVIEW OF SYSTEMS: Full 14 system review of systems performed and notable only for those listed, all others are neg:  As above  ALLERGIES: Allergies  Allergen Reactions  . Oxycodone-Acetaminophen Other (See Comments)    REACTION: Syncope    HOME MEDICATIONS: Outpatient Medications Prior to Visit  Medication Sig Dispense Refill  . acetaminophen (TYLENOL) 325 MG tablet Take 650 mg by mouth every 6 (six) hours as needed for mild pain.    Marland Kitchen aspirin 81 MG chewable tablet Chew 81 mg by mouth daily.    Marland Kitchen atenolol  (TENORMIN) 50 MG tablet Take 1 tablet (50 mg total) by mouth daily. 90 tablet 0  . cholecalciferol (VITAMIN D) 1000 units tablet Take 1,000 Units by mouth daily.    . cycloSPORINE (RESTASIS) 0.05 % ophthalmic emulsion Place 1 drop into both eyes 2 (two) times daily.     . fenofibrate (TRICOR) 145 MG tablet Take 1 tablet (145 mg total) by mouth daily. Please make overdue yearly appt with Dr. Acie Fredrickson before anymore refills. 2nd attempt 15 tablet 0  . levothyroxine (SYNTHROID, LEVOTHROID) 175 MCG tablet Take 175 mcg by mouth daily before breakfast.    . metFORMIN (GLUCOPHAGE) 500 MG tablet Take 500 mg by mouth 2 (two) times daily with a meal.     . olmesartan-hydrochlorothiazide (BENICAR HCT) 40-25 MG tablet daily.  3   No facility-administered medications prior to visit.     PAST MEDICAL HISTORY: Past Medical History:  Diagnosis Date  . Anemia   . Atypical chest pain    stress test 1/12-normal  . 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 Chronic kidney disease    hypertensive chronic kideny disease benign/ stage 3  . Diabetes mellitus, type 2 ([place])   . Esophagitis determined by endoscopy 03/2015  . GERD (gastroesophageal reflux disease)   . Gout 04/2015   L toe  . History of frequent urinary tract infections   . History of hiatal hernia   . History of kidney  stones   . HTN (hypertension)   . Hyperlipemia   . Hypothyroidism   . Peripheral neuropathy   . Spondylolisthesis of lumbar region   . Stroke [facility])    in the eye only; right eye   . Thyroid goiter    multinodular   . Wears glasses     PAST SURGICAL HISTORY: Past Surgical History:  Procedure Laterality Date  . ABDOMINAL HYSTERECTOMY    . ABSCESS DRAINAGE  1995   breast  . ANTERIOR LAT LUMBAR FUSION N/A 02/02/2018   Procedure: Anterolateral decompression Lumbar Four-Five with xlif percutaneous fixation with robotic placement of screws Lumbar four-five;  Surgeon: Kristeen Miss, MD;  Location: [place];  Service:  Neurosurgery;  Laterality: N/A;  . APPENDECTOMY    . APPLICATION OF ROBOTIC ASSISTANCE FOR SPINAL PROCEDURE N/A 02/02/2018   Procedure: APPLICATION OF ROBOTIC ASSISTANCE FOR SPINAL PROCEDURE;  Surgeon: Kristeen Miss, MD;  Location: [place];  Service: Neurosurgery;  Laterality: N/A;  . BREAST BIOPSY    . BREAST CYST EXCISION  02/26/2012   Procedure: CYST EXCISION BREAST;  Surgeon: Harl Bowie, MD;  Location: [place];  Service: General;  Laterality: Left;  excision chronic cyst left breast  . BUNIONECTOMY     LEFT  . CHOLECYSTECTOMY N/A 08/17/2015   Procedure: LAPAROSCOPIC CHOLECYSTECTOMY WITH INTRAOPERATIVE CHOLANGIOGRAM;  Surgeon: Armandina Gemma, MD;  [facility];  Service: General;  Laterality: N/A;  . DILATION AND CURETTAGE OF UTERUS    . KNEE SURGERY     BILATERAL  . LUMBAR PERCUTANEOUS PEDICLE SCREW 1 LEVEL N/A 02/02/2018   Procedure: LUMBAR PERCUTANEOUS PEDICLE SCREW LUMBAR FOUR-FIVE;  Surgeon: Kristeen Miss, MD;  Location: [place];  Service: Neurosurgery;  Laterality: N/A;  . TEMPORAL ARTERY BIOPSY / LIGATION    . TONSILLECTOMY AND ADENOIDECTOMY    . TUBAL LIGATION    . TUMOR REMOVAL     LEFT ARM-lipoma    FAMILY HISTORY: Family History  Problem Relation Age of Onset  . Dementia Father   . Heart disease Father   . Hypertension Father   . Stroke Father   . Dementia Mother   . Kidney disease Mother   . Cancer Mother   . Diabetes Mother     SOCIAL HISTORY: Social History   Socioeconomic History  . Marital status: Married    Spouse name: Elenore Rota  . Number of children: 1  . Years of education: 12th  . Highest education level: Not on file  Occupational History  . Occupation: Retired  Scientific laboratory technician  . Financial resource strain: Not on file  . Food insecurity    Worry: Not on file    Inability: Not on file  . Transportation needs    Medical: Not on file    Non-medical: Not on file  Tobacco Use  . Smoking status: Never Smoker  . Smokeless tobacco:  Never Used  Substance and Sexual Activity  . Alcohol use: No  . Drug use: No  . Sexual activity: Not on file  Lifestyle  . Physical activity    Days per week: Not on file    Minutes per session: Not on file  . Stress: Not on file  Relationships  . Social Herbalist on phone: Not on file    Gets together: Not on file    Attends religious service: Not on file    Active member of club or organization: Not on file    Attends meetings of clubs or  organizations: Not on file    Relationship status: Not on file  . Intimate partner violence    Fear of current or ex partner: Not on file    Emotionally abused: Not on file    Physically abused: Not on file    Forced sexual activity: Not on file  Other Topics Concern  . Not on file  Social History Narrative   Patient lives at home with husband Elenore Rota.    Patient has 1 child.    Patient is retired.    Patient has a high school education.      PHYSICAL EXAM  Vitals:   05/13/19 1136  BP: 138/66  Pulse: 62  Weight: 188 lb 8 oz (85.5 kg)   Body mass index is 33.39 kg/m. Generalized: Well developed, in no acute distress  Neurological examination  Mentation: Alert oriented to time, place, history taking. Follows all commands speech and language fluent  Cranial nerve II-XII: Pupils were equal round reactive to light extraocular movements were full, visual field were full on confrontational test. Facial sensation and strength were normal. hearing was intact to finger rubbing bilaterally. Uvula tongue midline. head turning and shoulder shrug and were normal and symmetric.Tongue protrusion into cheek strength was normal.  Motor: normal bulk and tone, full strength in the BUE, BLE, fine finger movements normal, no pronator drift. No focal weakness  Coordination: finger-nose-finger, heel-to-shin bilaterally, no dysmetria  Reflexes: 1+ upper lower and symmetric  Gait and Station: Rising up from seated position without  assistance, normal stance, moderate stride, good arm swing, smooth turning.   DIAGNOSTIC DATA (LABS, IMAGING, TESTING) - I reviewed patient records, labs, notes, testing and imaging myself where available.  Lab Results  Component Value Date   WBC 5.6 01/26/2018   HGB 10.2 (L) 02/02/2018   HCT 30.0 (L) 02/02/2018   MCV 95.2 01/26/2018   PLT 203 01/26/2018      Component Value Date/Time   NA 138 02/02/2018 1023   NA 141 02/14/2017 0825   K 3.6 02/02/2018 1023   CL 105 01/26/2018 1023   CO2 24 01/26/2018 1023   GLUCOSE 174 (H) 02/02/2018 1023   BUN 28 (H) 01/26/2018 1023   BUN 25 02/14/2017 0825   CREATININE 1.33 (H) 01/26/2018 1023   CALCIUM 9.4 01/26/2018 1023   PROT 6.9 02/14/2017 0825   ALBUMIN 4.2 02/14/2017 0825   AST 30 02/14/2017 0825   ALT 27 02/14/2017 0825   ALKPHOS 60 02/14/2017 0825   BILITOT 0.4 02/14/2017 0825   GFRNONAA 38 (L) 01/26/2018 1023   GFRAA 44 (L) 01/26/2018 1023    ASSESSMENT AND PLAN  73 y.o. year old female   Chronic headaches  Doing well on atenolol 50 mg daily  Tylenol as needed for headache  Marcial Pacas, M.D. Ph.D.  [facility] [place], [place] [address] Phone: [phone] Fax:      [phone]

## 2019-11-25 ENCOUNTER — Other Ambulatory Visit: Payer: Self-pay | Admitting: Neurology

## 2020-06-12 ENCOUNTER — Other Ambulatory Visit: Payer: Self-pay | Admitting: Neurology

## 2020-07-17 ENCOUNTER — Other Ambulatory Visit: Payer: Self-pay

## 2020-07-17 ENCOUNTER — Other Ambulatory Visit: Payer: Federal, State, Local not specified - PPO

## 2020-07-17 DIAGNOSIS — Z20822 Contact with and (suspected) exposure to covid-19: Secondary | ICD-10-CM

## 2020-07-20 LAB — NOVEL CORONAVIRUS, NAA: SARS-CoV-2, NAA: DETECTED — AB

## 2020-07-21 ENCOUNTER — Encounter: Payer: Self-pay | Admitting: Physician Assistant

## 2020-07-21 ENCOUNTER — Other Ambulatory Visit: Payer: Self-pay | Admitting: Physician Assistant

## 2020-07-21 ENCOUNTER — Ambulatory Visit (HOSPITAL_COMMUNITY)
Admission: RE | Admit: 2020-07-21 | Discharge: 2020-07-21 | Disposition: A | Discharge status: Home / Self Care | Payer: Federal, State, Local not specified - PPO | Source: Ambulatory Visit | Attending: Pulmonary Disease | Admitting: Pulmonary Disease

## 2020-07-21 DIAGNOSIS — E049 Nontoxic goiter, unspecified: Secondary | ICD-10-CM | POA: Insufficient documentation

## 2020-07-21 DIAGNOSIS — E1169 Type 2 diabetes mellitus with other specified complication: Secondary | ICD-10-CM

## 2020-07-21 DIAGNOSIS — I129 Hypertensive chronic kidney disease with stage 1 through stage 4 chronic kidney disease, or unspecified chronic kidney disease: Secondary | ICD-10-CM | POA: Diagnosis not present

## 2020-07-21 DIAGNOSIS — E1122 Type 2 diabetes mellitus with diabetic chronic kidney disease: Secondary | ICD-10-CM | POA: Diagnosis not present

## 2020-07-21 DIAGNOSIS — N183 Chronic kidney disease, stage 3 unspecified: Secondary | ICD-10-CM

## 2020-07-21 DIAGNOSIS — I10 Essential (primary) hypertension: Secondary | ICD-10-CM

## 2020-07-21 DIAGNOSIS — U071 COVID-19: Secondary | ICD-10-CM | POA: Insufficient documentation

## 2020-07-21 MED ORDER — ALBUTEROL SULFATE HFA 108 (90 BASE) MCG/ACT IN AERS: 2.0000 | INHALATION_SPRAY | Freq: Once | RESPIRATORY_TRACT | Status: DC | PRN

## 2020-07-21 MED ORDER — FAMOTIDINE IN NACL 20-0.9 MG/50ML-% IV SOLN: 20.0000 mg | Freq: Once | INTRAVENOUS | Status: DC | PRN

## 2020-07-21 MED ORDER — DIPHENHYDRAMINE HCL 50 MG/ML IJ SOLN: 50.0000 mg | Freq: Once | INTRAMUSCULAR | Status: DC | PRN

## 2020-07-21 MED ORDER — SOTROVIMAB 500 MG/8ML IV SOLN
500.0000 mg | Freq: Once | INTRAVENOUS | Status: AC
  Administered 2020-07-21: 500 mg via INTRAVENOUS

## 2020-07-21 MED ORDER — METHYLPREDNISOLONE SODIUM SUCC 125 MG IJ SOLR: 125.0000 mg | Freq: Once | INTRAMUSCULAR | Status: DC | PRN

## 2020-07-21 MED ORDER — SODIUM CHLORIDE 0.9 % IV SOLN: INTRAVENOUS | Status: DC | PRN

## 2020-07-21 MED ORDER — EPINEPHRINE 0.3 MG/0.3ML IJ SOAJ: 0.3000 mg | Freq: Once | INTRAMUSCULAR | Status: DC | PRN

## 2020-07-21 MED ORDER — SODIUM CHLORIDE 0.9 % IV BOLUS
1000.0000 mL | Freq: Once | INTRAVENOUS | Status: AC
  Administered 2020-07-21: 1000 mL via INTRAVENOUS

## 2020-07-21 NOTE — Discharge Instructions (Signed)

## 2020-07-21 NOTE — Progress Notes (Signed)
I connected by phone with Theresa Shannon on 07/21/2020 at 10:01 AM to discuss the potential use of a new treatment for mild to moderate COVID-19 viral infection in non-hospitalized patients.  This patient is a 75 y.o. female that meets the FDA criteria for Emergency Use Authorization of COVID monoclonal antibody sotrovimab.  Has a (+) direct SARS-CoV-2 viral test result  Has mild or moderate COVID-19   Is NOT hospitalized due to COVID-19  Is within 10 days of symptom onset  Has at least one of the high risk factor(s) for progression to severe COVID-19 and/or hospitalization as defined in EUA.  Specific high risk criteria : Older age (>/= 77 yo), BMI > 25, Chronic Kidney Disease (CKD), Diabetes, Cardiovascular disease or hypertension and Other high risk medical condition per CDC:  high SVI   I have spoken and communicated the following to the patient or parent/caregiver regarding COVID monoclonal antibody treatment:  1. FDA has authorized the emergency use for the treatment of mild to moderate COVID-19 in adults and pediatric patients with positive results of direct SARS-CoV-2 viral testing who are 75 years of age and older weighing at least 40 kg, and who are at high risk for progressing to severe COVID-19 and/or hospitalization.  2. The significant known and potential risks and benefits of COVID monoclonal antibody, and the extent to which such potential risks and benefits are unknown.  3. Information on available alternative treatments and the risks and benefits of those alternatives, including clinical trials.  4. Patients treated with COVID monoclonal antibody should continue to self-isolate and use infection control measures (e.g., wear mask, isolate, social distance, avoid sharing personal items, clean and disinfect "high touch" surfaces, and frequent handwashing) according to CDC guidelines.   5. The patient or parent/caregiver has the option to accept or refuse COVID monoclonal  antibody treatment.  After reviewing this information with the patient, the patient has agreed to receive one of the available covid 19 monoclonal antibodies and will be provided an appropriate fact sheet prior to infusion.   Pt is vaccinated but highly symptomatic. Sx onset 1/9. Set up for infusion today at 12:30pm.   Angelena Form, PA-C 07/21/2020 10:01 AM

## 2020-07-21 NOTE — Progress Notes (Signed)
Diagnosis: COVID-19  Physician: Dr. Patrick Wright  Procedure: Covid Infusion Clinic Med: Sotrovimab infusion - Provided patient with sotrovimab fact sheet for patients, parents, and caregivers prior to infusion.   Complications: No immediate complications noted  Discharge: Discharged home    

## 2020-07-21 NOTE — Progress Notes (Signed)
Patient reviewed Fact Sheet for Patients, Parents, and Caregivers for Emergency Use Authorization (EUA) of sotrovimab for the Treatment of Coronavirus. Patient also reviewed and is agreeable to the estimated cost of treatment. Patient is agreeable to proceed.   

## 2021-10-31 ENCOUNTER — Other Ambulatory Visit: Payer: Self-pay | Admitting: Radiology

## 2021-11-05 ENCOUNTER — Encounter: Payer: Self-pay | Admitting: Internal Medicine

## 2021-11-12 ENCOUNTER — Encounter: Payer: Self-pay | Admitting: *Deleted

## 2021-11-12 DIAGNOSIS — D0511 Intraductal carcinoma in situ of right breast: Secondary | ICD-10-CM | POA: Insufficient documentation

## 2021-11-13 ENCOUNTER — Encounter: Payer: Federal, State, Local not specified - PPO | Admitting: Genetic Counselor

## 2021-11-13 NOTE — Progress Notes (Signed)
?Radiation Oncology         [phone] ?________________________________ ? ?Multidisciplinary Breast Oncology Clinic [facility]) ?Initial Outpatient Consultation ? ?Name: Theresa Shannon MRN: [id number]  ?Date: 11/14/2021  DOB: June 14, 1946 ? ?IO:MBTDHRC, Delfino Lovett, MD  Rolm Bookbinder, MD  ? ?REFERRING PHYSICIAN: Rolm Bookbinder, MD ? 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 ? 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 ?  ICD-10-CM   ?1. Ductal carcinoma in situ (DCIS) of right breast  D05.11   ?  ? ? ?HISTORY OF PRESENT ILLNESS::[name] is a 76 y.o. female who is presenting to the office today for evaluation of her newly diagnosed breast cancer. She is accompanied by her husband and daughter. She is doing well overall.  ? ?She had routine screening mammography on 10/15/21 showing a possible abnormality in the right breast. She underwent unilateral left diagnostic mammography with tomography [facility] on 10/18/21 showing: indeterminate segmental right breast calcifications, including a group in the upper inner middle depth measuring 1.7 cm, and a group in the upper inner posterior depth measuring 1.5 cm. ? ?Biopsies of the right breast on 10/31/21 showed low grade ductal carcinoma in-situ with calcifications from one biopsy, and no evidence of malignancy in the other. Prognostic indicators significant for: estrogen receptor, 100% positive and progesterone receptor, 95% positive, both with strong staining intensity. HER2 not assessed. ? ?Menarche: 76 years old ?Age at first live birth: 76 years old ?GP: 1 ?LMP: February 1989 ?Contraceptive: never used ?HRT: yes for 15 years, she is not sure when she stopped   ? ?The patient was referred today for presentation in the multidisciplinary conference.  Radiology studies and pathology slides were presented there for review and discussion of  treatment options.  A consensus was discussed regarding potential next steps. ? ?PREVIOUS RADIATION THERAPY: No ? ?PAST MEDICAL HISTORY:  ?Past Medical History:  ?Diagnosis Date  ? Anemia   ? Atypical chest pain   ? stress test 1/12-normal  ? 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 Chronic kidney disease   ? 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 Diabetes mellitus, type 2 ([place])   ? Esophagitis determined by endoscopy 03/2015  ? GERD (gastroesophageal reflux disease)   ? Gout 04/2015  ? L toe  ? History of frequent urinary tract infections   ? History of hiatal hernia   ? History of kidney stones   ? HTN (hypertension)   ? Hyperlipemia   ? Hypertension   ? Hypothyroidism   ? Peripheral neuropathy   ? Spondylolisthesis of lumbar region   ? Stroke [facility])   ? in the eye only; right eye   ? 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 Wears glasses   ? ? ?PAST SURGICAL HISTORY: ?Past Surgical History:  ?Procedure Laterality Date  ? ABDOMINAL HYSTERECTOMY    ? ABSCESS DRAINAGE  1995  ? breast  ? ANTERIOR LAT LUMBAR FUSION N/A 02/02/2018  ? Procedure: Anterolateral decompression Lumbar Four-Five with xlif percutaneous fixation with robotic placement of screws Lumbar four-five;  Surgeon: Kristeen Miss, MD;  Location: [place];  Service: Neurosurgery;  Laterality: N/A;  ? APPENDECTOMY    ? APPLICATION OF ROBOTIC ASSISTANCE FOR SPINAL PROCEDURE N/A 02/02/2018  ? Procedure: APPLICATION OF ROBOTIC ASSISTANCE FOR SPINAL PROCEDURE;  Surgeon: Kristeen Miss, MD;  Location: [place];  Service:  Neurosurgery;  Laterality: N/A;  ? BREAST BIOPSY    ? BREAST CYST EXCISION  02/26/2012  ? Procedure: CYST EXCISION BREAST;  Surgeon: Harl Bowie, MD;  Location: [place];  Service: General;  Laterality: Left;  excision chronic cyst left breast  ? BUNIONECTOMY    ? LEFT  ? CHOLECYSTECTOMY N/A 08/17/2015  ? Procedure: LAPAROSCOPIC CHOLECYSTECTOMY WITH INTRAOPERATIVE CHOLANGIOGRAM;  Surgeon: Armandina Gemma, MD;   [facility];  Service: General;  Laterality: N/A;  ? DILATION AND CURETTAGE OF UTERUS    ? KNEE SURGERY    ? BILATERAL  ? LUMBAR PERCUTANEOUS PEDICLE SCREW 1 LEVEL N/A 02/02/2018  ? Procedure: LUMBAR PERCUTANEOUS PEDICLE SCREW LUMBAR FOUR-FIVE;  Surgeon: Kristeen Miss, MD;  Location: [place];  Service: Neurosurgery;  Laterality: N/A;  ? TEMPORAL ARTERY BIOPSY / LIGATION    ? TONSILLECTOMY AND ADENOIDECTOMY    ? TUBAL LIGATION    ? TUMOR REMOVAL    ? LEFT ARM-lipoma  ? ? ?FAMILY HISTORY:  ?Family History  ?Problem Relation Age of Onset  ? Dementia Mother   ? Kidney disease Mother   ? Cancer Mother   ? Diabetes Mother   ? Breast cancer Mother   ? Dementia Father   ? Heart disease Father   ? Hypertension Father   ? Stroke Father   ? Breast cancer Sister   ? Kidney cancer Sister   ? ? ?SOCIAL HISTORY:  ?Social History  ? ?Socioeconomic History  ? Marital status: Married  ?  Spouse name: Elenore Rota  ? Number of children: 1  ? Years of education: 12th  ? Highest education level: Not on file  ?Occupational History  ? Occupation: Retired  ?Tobacco Use  ? Smoking status: Never  ? Smokeless tobacco: Never  ?Vaping Use  ? Vaping Use: Never used  ?Substance and Sexual Activity  ? Alcohol use: No  ? Drug use: No  ? Sexual activity: Not on file  ?Other Topics Concern  ? Not on file  ?Social History Narrative  ? Patient lives at home with husband Elenore Rota.   ? Patient has 1 child.   ? Patient is retired.   ? Patient has a high school education.   ? ?Social Determinants of Health  ? ?Financial Resource Strain: Low Risk   ? Difficulty of Paying Living Expenses: Not very hard  ?Food Insecurity: No Food Insecurity  ? Worried About Charity fundraiser in the Last Year: Never true  ? Ran Out of Food in the Last Year: Never true  ?Transportation Needs: No Transportation Needs  ? Lack of Transportation (Medical): No  ? Lack of Transportation (Non-Medical): No  ?Physical Activity: Not on file  ?Stress: Not on file  ?Social Connections: Not on  file  ? ? ?ALLERGIES:  ?Allergies  ?Allergen Reactions  ? Amlodipine Swelling and Rash  ? Oxycodone-Acetaminophen Other (See Comments)  ?  REACTION: Syncope  ? ? ?MEDICATIONS:  ?Current Outpatient Medications  ?Medication Sig Dispense Refill  ? acetaminophen (TYLENOL) 325 MG tablet Take 650 mg by mouth every 6 (six) hours as needed for mild pain.    ? aspirin 81 MG chewable tablet Chew 81 mg by mouth daily.    ? atenolol (TENORMIN) 50 MG tablet Take 1 tablet (50 mg total) by mouth daily. 90 tablet 4  ? cholecalciferol (VITAMIN D) 1000 units tablet Take 1,000 Units by mouth daily.    ? cycloSPORINE (RESTASIS) 0.05 % ophthalmic emulsion Place 1 drop into both  eyes 2 (two) times daily.     ? fenofibrate (TRICOR) 145 MG tablet Take 1 tablet (145 mg total) by mouth daily. Please make overdue yearly appt with Dr. Acie Fredrickson before anymore refills. 2nd attempt 15 tablet 0  ? levothyroxine (SYNTHROID, LEVOTHROID) 175 MCG tablet Take 175 mcg by mouth daily before breakfast.    ? metFORMIN (GLUCOPHAGE) 500 MG tablet Take 500 mg by mouth 2 (two) times daily with a meal.     ? olmesartan-hydrochlorothiazide (BENICAR HCT) 40-25 MG tablet daily.  3  ? ?No current facility-administered medications for this encounter.  ? ? ?REVIEW OF SYSTEMS: A 10+ POINT REVIEW OF SYSTEMS WAS OBTAINED including neurology, dermatology, psychiatry, cardiac, respiratory, lymph, extremities, GI, GU, musculoskeletal, constitutional, reproductive, HEENT. On the provided form, she reports wearing glasses, runny nose, feet swelling, abdominal pain, stage 3 CKD, history of skin cancer, psoriasis, gout, diabetes, and a thyroid problem. She denies any other symptoms.  ?  ?PHYSICAL EXAM:   ? ? 11/14/2021  ?Vitals with BMI   ?Height _0    ?Weight 170 lbs 6 oz   ?BMI 30.19   ?Systolic 625 !   ?Diastolic 55 !   ?Pulse 65   ?  ?! Abnormal ? ?Lungs are clear to auscultation bilaterally. Heart has regular rate and rhythm. No palpable cervical, supraclavicular, or  axillary adenopathy. Abdomen soft, non-tender, normal bowel sounds. ?Breast: Left breast with no palpable mass, nipple discharge, or bleeding. Right breast with some bruising the in the upper inner aspect and biopsy sit

## 2021-11-14 ENCOUNTER — Ambulatory Visit: Payer: Federal, State, Local not specified - PPO | Admitting: Radiation Oncology

## 2021-11-14 ENCOUNTER — Inpatient Hospital Stay
Payer: Federal, State, Local not specified - PPO | Attending: Hematology and Oncology | Admitting: Hematology and Oncology

## 2021-11-14 ENCOUNTER — Other Ambulatory Visit: Payer: Self-pay | Admitting: General Surgery

## 2021-11-14 ENCOUNTER — Inpatient Hospital Stay: Payer: Federal, State, Local not specified - PPO | Admitting: Licensed Clinical Social Worker

## 2021-11-14 ENCOUNTER — Inpatient Hospital Stay: Payer: Federal, State, Local not specified - PPO

## 2021-11-14 ENCOUNTER — Other Ambulatory Visit: Payer: Self-pay | Admitting: *Deleted

## 2021-11-14 ENCOUNTER — Encounter: Payer: Self-pay | Admitting: *Deleted

## 2021-11-14 ENCOUNTER — Ambulatory Visit
Admission: RE | Admit: 2021-11-14 | Discharge: 2021-11-14 | Disposition: A | Discharge status: Home / Self Care | Payer: Federal, State, Local not specified - PPO | Source: Ambulatory Visit | Attending: Radiation Oncology | Admitting: Radiation Oncology

## 2021-11-14 ENCOUNTER — Other Ambulatory Visit: Payer: Self-pay

## 2021-11-14 ENCOUNTER — Ambulatory Visit: Payer: Federal, State, Local not specified - PPO | Admitting: Physical Therapy

## 2021-11-14 ENCOUNTER — Inpatient Hospital Stay (HOSPITAL_BASED_OUTPATIENT_CLINIC_OR_DEPARTMENT_OTHER): Payer: Federal, State, Local not specified - PPO | Admitting: Genetic Counselor

## 2021-11-14 ENCOUNTER — Encounter: Payer: Self-pay | Admitting: Hematology and Oncology

## 2021-11-14 DIAGNOSIS — D0511 Intraductal carcinoma in situ of right breast: Secondary | ICD-10-CM

## 2021-11-14 DIAGNOSIS — I129 Hypertensive chronic kidney disease with stage 1 through stage 4 chronic kidney disease, or unspecified chronic kidney disease: Secondary | ICD-10-CM | POA: Diagnosis not present

## 2021-11-14 DIAGNOSIS — E785 Hyperlipidemia, unspecified: Secondary | ICD-10-CM | POA: Insufficient documentation

## 2021-11-14 DIAGNOSIS — Z803 Family history of malignant neoplasm of breast: Secondary | ICD-10-CM | POA: Diagnosis not present

## 2021-11-14 DIAGNOSIS — I10 Essential (primary) hypertension: Secondary | ICD-10-CM | POA: Insufficient documentation

## 2021-11-14 DIAGNOSIS — Z79899 Other long term (current) drug therapy: Secondary | ICD-10-CM | POA: Insufficient documentation

## 2021-11-14 DIAGNOSIS — K219 Gastro-esophageal reflux disease without esophagitis: Secondary | ICD-10-CM | POA: Diagnosis not present

## 2021-11-14 DIAGNOSIS — D631 Anemia in chronic kidney disease: Secondary | ICD-10-CM | POA: Insufficient documentation

## 2021-11-14 DIAGNOSIS — E039 Hypothyroidism, unspecified: Secondary | ICD-10-CM | POA: Insufficient documentation

## 2021-11-14 DIAGNOSIS — Z8673 Personal history of transient ischemic attack (TIA), and cerebral infarction without residual deficits: Secondary | ICD-10-CM | POA: Insufficient documentation

## 2021-11-14 DIAGNOSIS — E119 Type 2 diabetes mellitus without complications: Secondary | ICD-10-CM | POA: Insufficient documentation

## 2021-11-14 DIAGNOSIS — Z170 Estrogen receptor positive status [ER+]: Secondary | ICD-10-CM | POA: Diagnosis not present

## 2021-11-14 DIAGNOSIS — N183 Chronic kidney disease, stage 3 unspecified: Secondary | ICD-10-CM | POA: Diagnosis not present

## 2021-11-14 DIAGNOSIS — G629 Polyneuropathy, unspecified: Secondary | ICD-10-CM | POA: Diagnosis not present

## 2021-11-14 LAB — CMP (CANCER CENTER ONLY)
ALT: 42 U/L (ref 0–44)
AST: 48 U/L — ABNORMAL HIGH (ref 15–41)
Albumin: 4.2 g/dL (ref 3.5–5.0)
Alkaline Phosphatase: 49 U/L (ref 38–126)
Anion gap: 8 (ref 5–15)
BUN: 31 mg/dL — ABNORMAL HIGH (ref 8–23)
CO2: 24 mmol/L (ref 22–32)
Calcium: 9.5 mg/dL (ref 8.9–10.3)
Chloride: 108 mmol/L (ref 98–111)
Creatinine: 1.73 mg/dL — ABNORMAL HIGH (ref 0.44–1.00)
GFR, Estimated: 30 mL/min — ABNORMAL LOW (ref >60)
Glucose, Bld: 170 mg/dL — ABNORMAL HIGH (ref 70–99)
Potassium: 5 mmol/L (ref 3.5–5.1)
Sodium: 140 mmol/L (ref 135–145)
Total Bilirubin: 0.4 mg/dL (ref 0.3–1.2)
Total Protein: 7.6 g/dL (ref 6.5–8.1)

## 2021-11-14 LAB — CBC WITH DIFFERENTIAL (CANCER CENTER ONLY)
Abs Immature Granulocytes: 0.03 10*3/uL (ref 0.00–0.07)
Basophils Absolute: 0.1 10*3/uL (ref 0.0–0.1)
Basophils Relative: 1 %
Eosinophils Absolute: 0.4 10*3/uL (ref 0.0–0.5)
Eosinophils Relative: 11 %
HCT: 36.4 % (ref 36.0–46.0)
Hemoglobin: 11.4 g/dL — ABNORMAL LOW (ref 12.0–15.0)
Immature Granulocytes: 1 %
Lymphocytes Relative: 29 %
Lymphs Abs: 1.2 10*3/uL (ref 0.7–4.0)
MCH: 29 pg (ref 26.0–34.0)
MCHC: 31.3 g/dL (ref 30.0–36.0)
MCV: 92.6 fL (ref 80.0–100.0)
Monocytes Absolute: 0.6 10*3/uL (ref 0.1–1.0)
Monocytes Relative: 14 %
Neutro Abs: 1.8 10*3/uL (ref 1.7–7.7)
Neutrophils Relative %: 44 %
Platelet Count: 207 10*3/uL (ref 150–400)
RBC: 3.93 MIL/uL (ref 3.87–5.11)
RDW: 13.8 % (ref 11.5–15.5)
WBC Count: 4 10*3/uL (ref 4.0–10.5)
nRBC: 0 % (ref 0.0–0.2)

## 2021-11-14 LAB — GENETIC SCREENING ORDER

## 2021-11-14 NOTE — Progress Notes (Signed)
[place] ?CONSULT NOTE ? ?Patient Care Team: ?Burnard Bunting, MD as PCP - General (Internal Medicine) ?Rolm Bookbinder, MD as Consulting Physician (General Surgery) ?Benay Pike, MD as Consulting Physician (Hematology and Oncology) ?Gery Pray, MD as Consulting Physician (Radiation Oncology) ?Mauro Kaufmann, RN as Oncology Nurse Navigator ?Rockwell Germany, RN as Oncology Nurse Navigator ? ?CHIEF COMPLAINTS/PURPOSE OF CONSULTATION:  ?Newly diagnosed breast cancer, DCIS ? ?HISTORY OF PRESENTING ILLNESS:  ?Theresa Shannon 76 y.o. female is here because of recent diagnosis of right breast DCIS ? ?I reviewed her records extensively and collaborated the history with the patient. ? ?SUMMARY OF ONCOLOGIC HISTORY: ?Oncology History  ?Ductal carcinoma in situ (DCIS) of right breast  ?10/15/2021 Mammogram  ? Digital screening bilateral mammogram showed indeterminate calcifications in the right breast. ?  ?10/31/2021 Pathology Results  ? Pathology from the breast biopsy showed low-grade DCIS with calcifications rest of the needle core biopsy showed fibrocystic changes with usual ductal hyperplasia and vascular calcifications ER 100% positive strong staining, PR 95% positive strong staining ?  ?11/12/2021 Initial Diagnosis  ? Ductal carcinoma in situ (DCIS) of right breast ?  ? ?Patient is here for an initial visit with daughter and her husband.  She took Premarin many years ago for almost 15 years but was stopped because of increased risk of cardiovascular events. ?Rest of the pertinent 10 point ROS reviewed and negative ? ?MEDICAL HISTORY:  ?Past Medical History:  ?Diagnosis Date  ? Anemia   ? Atypical chest pain   ? stress test 1/12-normal  ? 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 Chronic kidney disease   ? 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 Diabetes mellitus, type 2 ([place])   ? Esophagitis determined by endoscopy 03/2015  ? GERD (gastroesophageal reflux disease)   ?  Gout 04/2015  ? L toe  ? History of frequent urinary tract infections   ? History of hiatal hernia   ? History of kidney stones   ? HTN (hypertension)   ? Hyperlipemia   ? Hypertension   ? Hypothyroidism   ? Peripheral neuropathy   ? Spondylolisthesis of lumbar region   ? 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 Wears glasses   ? ? ?SURGICAL HISTORY: ?Past Surgical History:  ?Procedure Laterality Date  ? ABDOMINAL HYSTERECTOMY    ? ABSCESS DRAINAGE  1995  ? breast  ? ANTERIOR LAT LUMBAR FUSION N/A 02/02/2018  ? Procedure: Anterolateral decompression Lumbar Four-Five with xlif percutaneous fixation with robotic placement of screws Lumbar four-five;  Surgeon: Kristeen Miss, MD;  Location: [place];  Service: Neurosurgery;  Laterality: N/A;  ? APPENDECTOMY    ? APPLICATION OF ROBOTIC ASSISTANCE FOR SPINAL PROCEDURE N/A 02/02/2018  ? Procedure: APPLICATION OF ROBOTIC ASSISTANCE FOR SPINAL PROCEDURE;  Surgeon: Kristeen Miss, MD;  Location: [place];  Service: Neurosurgery;  Laterality: N/A;  ? BREAST BIOPSY    ? BREAST CYST EXCISION  02/26/2012  ? Procedure: CYST EXCISION BREAST;  Surgeon: Harl Bowie, MD;  Location: [place];  Service: General;  Laterality: Left;  excision chronic cyst left breast  ? BUNIONECTOMY    ? LEFT  ? CHOLECYSTECTOMY N/A 08/17/2015  ? Procedure: LAPAROSCOPIC CHOLECYSTECTOMY WITH INTRAOPERATIVE CHOLANGIOGRAM;  Surgeon: Armandina Gemma, MD;  [facility];  Service: General;  Laterality: N/A;  ? DILATION AND CURETTAGE OF  UTERUS    ? KNEE SURGERY    ? BILATERAL  ? LUMBAR PERCUTANEOUS PEDICLE SCREW 1 LEVEL N/A 02/02/2018  ? Procedure: LUMBAR PERCUTANEOUS PEDICLE SCREW LUMBAR FOUR-FIVE;  Surgeon: Kristeen Miss, MD;  Location: [place];  Service: Neurosurgery;  Laterality: N/A;  ? TEMPORAL ARTERY BIOPSY / LIGATION    ? TONSILLECTOMY AND ADENOIDECTOMY    ? TUBAL LIGATION    ? TUMOR REMOVAL    ? LEFT ARM-lipoma  ? ? ?SOCIAL HISTORY: ?Social History   ? ?Socioeconomic History  ? Marital status: Married  ?  Spouse name: Elenore Rota  ? Number of children: 1  ? Years of education: 12th  ? Highest education level: Not on file  ?Occupational History  ? Occupation: Retired  ?Tobacco Use  ? Smoking status: Never  ? Smokeless tobacco: Never  ?Vaping Use  ? Vaping Use: Never used  ?Substance and Sexual Activity  ? Alcohol use: No  ? Drug use: No  ? Sexual activity: Not on file  ?Other Topics Concern  ? Not on file  ?Social History Narrative  ? Patient lives at home with husband Elenore Rota.   ? Patient has 1 child.   ? Patient is retired.   ? Patient has a high school education.   ? ?Social Determinants of Health  ? ?Financial Resource Strain: Low Risk   ? Difficulty of Paying Living Expenses: Not very hard  ?Food Insecurity: No Food Insecurity  ? Worried About Charity fundraiser in the Last Year: Never true  ? Ran Out of Food in the Last Year: Never true  ?Transportation Needs: No Transportation Needs  ? Lack of Transportation (Medical): No  ? Lack of Transportation (Non-Medical): No  ?Physical Activity: Not on file  ?Stress: Not on file  ?Social Connections: Not on file  ?Intimate Partner Violence: Not on file  ? ? ?FAMILY HISTORY: ?Family History  ?Problem Relation Age of Onset  ? Dementia Mother   ? Kidney disease Mother   ? Cancer Mother   ? Diabetes Mother   ? Breast cancer Mother   ? Dementia Father   ? Heart disease Father   ? Hypertension Father   ? Stroke Father   ? Breast cancer Sister   ? Kidney cancer Sister   ? ? ?ALLERGIES:  is allergic to amlodipine and oxycodone-acetaminophen. ? ?MEDICATIONS:  ?Current Outpatient Medications  ?Medication Sig Dispense Refill  ? aspirin 81 MG chewable tablet Chew 81 mg by mouth daily.    ? atenolol (TENORMIN) 50 MG tablet Take 1 tablet (50 mg total) by mouth daily. 90 tablet 4  ? fenofibrate (TRICOR) 145 MG tablet Take 1 tablet (145 mg total) by mouth daily. Please make overdue yearly appt with Dr. Acie Fredrickson before anymore refills.  2nd attempt 15 tablet 0  ? levothyroxine (SYNTHROID, LEVOTHROID) 175 MCG tablet Take 175 mcg by mouth daily before breakfast.    ? metFORMIN (GLUCOPHAGE) 500 MG tablet Take 500 mg by mouth 2 (two) times daily with a meal.     ? olmesartan-hydrochlorothiazide (BENICAR HCT) 40-25 MG tablet daily.  3  ? acetaminophen (TYLENOL) 325 MG tablet Take 650 mg by mouth every 6 (six) hours as needed for mild pain.    ? cholecalciferol (VITAMIN D) 1000 units tablet Take 1,000 Units by mouth daily.    ? cycloSPORINE (RESTASIS) 0.05 % ophthalmic emulsion Place 1 drop into both eyes 2 (two) times daily.     ? ?No current facility-administered medications for this visit.  ? ? ?  REVIEW OF SYSTEMS:   ?Constitutional: Denies fevers, chills or abnormal night sweats ?Eyes: Denies blurriness of vision, double vision or watery eyes ?Ears, nose, mouth, throat, and face: Denies mucositis or sore throat ?Respiratory: Denies cough, dyspnea or wheezes ?Cardiovascular: Denies palpitation, chest discomfort or lower extremity swelling ?Gastrointestinal:  Denies nausea, heartburn or change in bowel habits ?Skin: Denies abnormal skin rashes ?Lymphatics: Denies new lymphadenopathy or easy bruising ?Neurological:Denies numbness, tingling or new weaknesses ?Behavioral/Psych: Mood is stable, no new changes  ?Breast: Denies any palpable lumps or discharge ?All other systems were reviewed with the patient and are negative. ? ?PHYSICAL EXAMINATION: ?ECOG PERFORMANCE STATUS: 0 - Asymptomatic ? ?Vitals:  ? 11/14/21 0845  ?BP: (!) 161/55  ?Pulse: 65  ?Resp: 18  ?Temp: 97.6 ?F (36.4 ?C)  ?SpO2: 99%  ? ?Filed Weights  ? 11/14/21 0845  ?Weight: 170 lb 6.4 oz (77.3 kg)  ? ? ?GENERAL:alert, no distress and comfortable ?SKIN: skin color, texture, turgor are normal, no rashes or significant lesions ?EYES: normal, conjunctiva are pink and non-injected, sclera clear ?OROPHARYNX:no exudate, no erythema and lips, buccal mucosa, and tongue normal  ?NECK: supple, thyroid  normal size, non-tender, without nodularity ?LYMPH:  no palpable lymphadenopathy in the cervical, axillary or inguinal ?LUNGS: clear to auscultation and percussion with normal breathing effort ?HEART: regula

## 2021-11-14 NOTE — Progress Notes (Signed)
[place] Clinical Social Work  ?Initial Assessment ? ? ?Theresa Shannon is a 76 y.o. year old female accompanied by daughter and husband. Clinical Social Work was referred by  [facility]  for assessment of psychosocial needs.  ? ?SDOH (Social Determinants of Health) assessments performed: Yes ?SDOH Interventions   ? ?Flowsheet Row Most Recent Value  ?SDOH Interventions   ?Food Insecurity Interventions Intervention Not Indicated  ?Financial Strain Interventions Intervention Not Indicated  ?Housing Interventions Intervention Not Indicated  ?Transportation Interventions Intervention Not Indicated  ? ?  ?  ?Distress Screen completed: Yes ? ?  11/14/2021  ? 10:40 AM  ?ONCBCN DISTRESS SCREENING  ?Screening Type Initial Screening  ?Distress experienced in past week (1-10) 3  ?Emotional problem type Nervousness/Anxiety  ?Information Concerns Type Lack of info about treatment  ?Physical Problem type Pain;Swollen arms/legs  ? ? ? ? ?Family/Social Information:  ?Housing Arrangement: patient lives with husband ?Family members/support persons in your life? Family (daughter, grandkids, great-grandkids nearby), Friends, and Church ?Transportation concerns: no  ?Employment: Retired. Income source: [place] ?Financial concerns: No ?Type of concern: None ?Food access concerns: no ?Religious or spiritual practice: yes, involved in church community ?Services Currently in place:  n/a ? ?Coping/ Adjustment to diagnosis: ?Patient understands treatment plan and what happens next? yes, feels less anxious after speaking with medical team ?Concerns about diagnosis and/or treatment: Overwhelmed by information ?Patient reported stressors: nervousness ?Hopes and priorities: be treated and able to still play with great grandkids (2.5 years and 12 days old) ?Patient enjoys time with family/ friends ?Current coping skills/ strengths: Capable of independent living , Motivation for treatment/growth , Religious Affiliation , and Supportive  family/friends  ? ? ? SUMMARY: ?Current SDOH Barriers:  ?No significant SDOH barriers at this time ? ?Clinical Social Work Clinical Goal(s):  ?None at this time ? ?Interventions: ?Discussed common feeling and emotions when being diagnosed with cancer, and the importance of support during treatment ?Informed patient of the support team roles and support services at [facility] ?Provided CSW contact information and encouraged patient to call with any questions or concerns ? ? ?Follow Up Plan: Patient will contact CSW with any support or resource needs ?Patient verbalizes understanding of plan: Yes ? ? ? ?Alexandre Lightsey E Shaniquia Brafford, LCSW ?

## 2021-11-14 NOTE — Assessment & Plan Note (Addendum)
This is a very pleasant 76 year old female patient with right breast DCIS, ER/PR positive referred to breast [place] for additional recommendations.  We have discussed the following details with the patient today. ? ?Pathology review: I discussed with the patient the difference between DCIS and invasive breast cancer. It is considered a precancerous lesion. DCIS is classified as a Stage 0 breast cancer. It is generally detected through mammograms as calcifications. We discussed the significance of grades and its impact on prognosis. We also discussed the importance of ER and PR receptors and their implications to adjuvant treatment options. Prognosis of DCIS dependence on grade and degree of comedo necrosis. It is anticipated that if not treated, 20-30% of DCIS can develop into invasive breast cancer. ? ?Recommendation: ?1. Breast conserving surgery ?2. Followed by adjuvant radiation therapy ?3. Followed by antiestrogen therapy with tamoxifen/aromatase inhibitors based on menopausal status ?5 years ? ?Tamoxifen counseling: We discussed the risks and benefits of tamoxifen. These include but not limited to insomnia, hot flashes, mood changes, vaginal dryness, and weight gain. Although rare, serious side effects including endometrial cancer, risk of blood clots were also discussed. We strongly believe that the benefits far outweigh the risks. Patient understands these risks and consented to starting treatment. Planned treatment duration is 5 years. ? ?Aromatase inhibitors counseling: We have discussed the mechanism of action of aromatase inhibitors today.  We have discussed adverse effects including but not limited to menopausal symptoms, increased risk of osteoporosis and fractures, cardiovascular events, arthralgias and myalgias.  We do believe that the benefits far outweigh the risks.  Plan treatment duration of 5 years. ? ?She is leaning towards aromatase inhibitor since she had sudden onset blindness and was thought  to have retinal artery thrombosis of unknown etiology.  She is hence worried about increased risk of DVT/PE or other cardiovascular events.  She also has underlying osteopenia but takes vitamin D supplementation. ? ?Given DCIS over the age of 54, there is no efficacy benefit for tamoxifen versus aromatase inhibitors.  We have discussed that we can certainly monitor the osteopenia and consider anastrozole outpatient after completion of radiation.  She will return to clinic to see me after surgery.  Thank you for consulting [place] in the care of this patient.  Please do not hesitate to contact us with any additional questions or concerns. ? ?

## 2021-11-14 NOTE — Research (Signed)
Trial:  [id number] - Tissue and Bodily Fluids: Translational Medicine: Discovery and Evaluation of Biomarkers/Pharmacogenomics for the Diagnosis and Personalized Management of Patients   ?Patient Theresa Shannon was identified by Dr. Chryl Heck as a potential candidate for the above listed study.  This Clinical Research Nurse met with Theresa Shannon, [id number], on 11/14/21 in a manner and location that ensures patient privacy to discuss participation in the above listed research study.  Patient is Accompanied by her husband and daughter .  A copy of the informed consent document and separate HIPAA Authorization was provided to the patient.  Patient reads, speaks, and understands [place].   ?Patient was provided with the business card of this Nurse and encouraged to contact the research team with any questions.  Approximately 5 minutes were spent with the patient reviewing the informed consent documents.  Patient was provided the option of taking informed consent documents home to review and was encouraged to review at their convenience with their support network, including other care providers. Patient took the consent documents home to review. ?Patient understands that if she decides to participate in this study she will need to consent and give blood prior to her breast surgery. Patient states she will call research nurse before her surgery to schedule research appointment if she is interested.  Thanked patient for her time.  ?Foye Spurling, BSN, RN, CCRP ?Clinical Research Nurse II ?11/14/2021 ? ?

## 2021-11-15 ENCOUNTER — Encounter: Payer: Self-pay | Admitting: Genetic Counselor

## 2021-11-15 DIAGNOSIS — Z803 Family history of malignant neoplasm of breast: Secondary | ICD-10-CM | POA: Insufficient documentation

## 2021-11-15 NOTE — Progress Notes (Signed)
REFERRING PROVIDER: ?Benay Pike, MD ?[address] ?[place], [place] [id number] ? ?PRIMARY PROVIDER:  ?Burnard Bunting, MD ? ?PRIMARY REASON FOR VISIT:  ?1. Ductal carcinoma in situ (DCIS) of right breast   ?2. Family history of breast cancer   ? ? ?HISTORY OF PRESENT ILLNESS:   ?Theresa Shannon, a 76 y.o. female, was seen for a [facility] cancer genetics consultation at the request of Dr. Chryl Heck due to a personal and family history of cancer.  Theresa Shannon presents to clinic today to discuss the possibility of a hereditary predisposition to cancer, to discuss genetic testing, and to further clarify her future cancer risks, as well as potential cancer risks for family members.  ? ?In April 2023, at the age of 58, Theresa Shannon was diagnosed with ductal carcinoma in situ of the right breast.  ? ?CANCER HISTORY:  ?Oncology History  ?Ductal carcinoma in situ (DCIS) of right breast  ?10/15/2021 Mammogram  ? Digital screening bilateral mammogram showed indeterminate calcifications in the right breast. ?  ?10/31/2021 Pathology Results  ? Pathology from the breast biopsy showed low-grade DCIS with calcifications rest of the needle core biopsy showed fibrocystic changes with usual ductal hyperplasia and vascular calcifications ER 100% positive strong staining, PR 95% positive strong staining ?  ?11/12/2021 Initial Diagnosis  ? Ductal carcinoma in situ (DCIS) of right breast ?  ?11/14/2021 Cancer Staging  ? Staging form: Breast, AJCC 8th Edition ?- Clinical stage from 11/14/2021: Stage 0 (cTis (DCIS), cN0, cM0, ER+, PR+, HER2: Not Assessed) - Signed by Benay Pike, MD on 11/14/2021 ?Stage prefix: Initial diagnosis ?Nuclear grade: G1 ?Laterality: Right ?Staged by: Pathologist and managing physician ?Stage used in treatment planning: Yes ?National guidelines used in treatment planning: Yes ?Type of national guideline used in treatment planning: NCCN ? ?  ? ? ? ?RISK FACTORS:  ?Menarche was at age 29.  ?First live birth at  age 39.  ?OCP use for approximately 0 years.  ?Ovaries intact: left ovary is intact.  ?Uterus intact: no.  ?Menopausal status: postmenopausal.  ?HRT use:  15  years. ?Colonoscopy: yes;  October 2022 . ?Mammogram within the last year: yes. ?Any excessive radiation exposure in the past: no ? ?Past Medical History:  ?Diagnosis Date  ? Anemia   ? Atypical chest pain   ? stress test 1/12-normal  ? 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 Chronic kidney disease   ? 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 Diabetes mellitus, type 2 ([place])   ? Esophagitis determined by endoscopy 03/2015  ? GERD (gastroesophageal reflux disease)   ? Gout 04/2015  ? L toe  ? History of frequent urinary tract infections   ? History of hiatal hernia   ? History of kidney stones   ? HTN (hypertension)   ? Hyperlipemia   ? Hypertension   ? Hypothyroidism   ? Peripheral neuropathy   ? Spondylolisthesis of lumbar region   ? 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 Wears glasses   ? ? ?Past Surgical History:  ?Procedure Laterality Date  ? ABDOMINAL HYSTERECTOMY    ? ABSCESS DRAINAGE  1995  ? breast  ? ANTERIOR LAT LUMBAR FUSION N/A 02/02/2018  ? Procedure: Anterolateral decompression Lumbar Four-Five with xlif percutaneous fixation with robotic placement of screws Lumbar four-five;  Surgeon: Kristeen Miss, MD;  Location: [place];  Service:  Neurosurgery;  Laterality: N/A;  ? APPENDECTOMY    ? APPLICATION OF ROBOTIC ASSISTANCE FOR SPINAL PROCEDURE N/A 02/02/2018  ? Procedure: APPLICATION OF ROBOTIC ASSISTANCE FOR SPINAL PROCEDURE;  Surgeon: Kristeen Miss, MD;  Location: [place];  Service: Neurosurgery;  Laterality: N/A;  ? BREAST BIOPSY    ? BREAST CYST EXCISION  02/26/2012  ? Procedure: CYST EXCISION BREAST;  Surgeon: Harl Bowie, MD;  Location: [place];  Service: General;  Laterality: Left;  excision chronic cyst left breast  ? BUNIONECTOMY    ? LEFT  ?  CHOLECYSTECTOMY N/A 08/17/2015  ? Procedure: LAPAROSCOPIC CHOLECYSTECTOMY WITH INTRAOPERATIVE CHOLANGIOGRAM;  Surgeon: Armandina Gemma, MD;  [facility];  Service: General;  Laterality: N/A;  ? DILATION AND CURETTAGE OF UTERUS    ? KNEE SURGERY    ? BILATERAL  ? LUMBAR PERCUTANEOUS PEDICLE SCREW 1 LEVEL N/A 02/02/2018  ? Procedure: LUMBAR PERCUTANEOUS PEDICLE SCREW LUMBAR FOUR-FIVE;  Surgeon: Kristeen Miss, MD;  Location: [place];  Service: Neurosurgery;  Laterality: N/A;  ? TEMPORAL ARTERY BIOPSY / LIGATION    ? TONSILLECTOMY AND ADENOIDECTOMY    ? TUBAL LIGATION    ? TUMOR REMOVAL    ? LEFT ARM-lipoma  ? ? ?Social History  ? ?Socioeconomic History  ? Marital status: Married  ?  Spouse name: Elenore Rota  ? Number of children: 1  ? Years of education: 12th  ? Highest education level: Not on file  ?Occupational History  ? Occupation: Retired  ?Tobacco Use  ? Smoking status: Never  ? Smokeless tobacco: Never  ?Vaping Use  ? Vaping Use: Never used  ?Substance and Sexual Activity  ? Alcohol use: No  ? Drug use: No  ? Sexual activity: Not on file  ?Other Topics Concern  ? Not on file  ?Social History Narrative  ? Patient lives at home with husband Elenore Rota.   ? Patient has 1 child.   ? Patient is retired.   ? Patient has a high school education.   ? ?Social Determinants of Health  ? ?Financial Resource Strain: Low Risk   ? Difficulty of Paying Living Expenses: Not very hard  ?Food Insecurity: No Food Insecurity  ? Worried About Charity fundraiser in the Last Year: Never true  ? Ran Out of Food in the Last Year: Never true  ?Transportation Needs: No Transportation Needs  ? Lack of Transportation (Medical): No  ? Lack of Transportation (Non-Medical): No  ?Physical Activity: Not on file  ?Stress: Not on file  ?Social Connections: Not on file  ?  ? ?FAMILY HISTORY:  ?We obtained a detailed, 4-generation family history.  Significant diagnoses are listed below: ?Family History  ?Problem Relation Age of Onset  ? Breast cancer Mother 78   ? Breast cancer Sister 70  ? Kidney cancer Sister 59  ? Leukemia Maternal Aunt   ? Pancreatic cancer Paternal Aunt   ? ? ? ? ? ?Theresa Shannon's sister was diagnosed with breast cancer at age 40 and a second sister was diagnosed with renal cell carcinoma at age 58. Her mother was diagnosed with breast cancer at age 45, she died at age 66. Her maternal aunt was diagnosed with leukemia and is deceased. Her maternal grandfather was diagnosed with an unknown type of brain tumor, he is deceased. Theresa Shannon paternal aunt was diagnosed with pancreatic cancer at an unknown age, she is deceased. Theresa Shannon is unaware of previous family history of genetic testing for hereditary cancer risks. There  is no reported Ashkenazi Jewish ancestry.  ? ?GENETIC COUNSELING ASSESSMENT: Theresa Shannon is a 76 y.o. female with a personal and family history of cancer which is somewhat suggestive of a hereditary and predisposition to cancer. We, therefore, discussed and recommended the following at today's visit.  ? ?DISCUSSION: We discussed that 5 - 10% of cancer is hereditary, with most cases of breast cancer associated with BRCA1/2.  There are other genes that can be associated with hereditary breast cancer syndromes.  We discussed that testing is beneficial for several reasons including knowing how to follow individuals after completing their treatment, identifying whether potential treatment options would be beneficial, and understanding if other family members could be at risk for cancer and allowing them to undergo genetic testing.  ? ?We reviewed the characteristics, features and inheritance patterns of hereditary cancer syndromes. We also discussed genetic testing, including the appropriate family members to test, the process of testing, insurance coverage and turn-around-time for results. We discussed the implications of a negative, positive, carrier and/or variant of uncertain significant result. We recommended Theresa Shannon  pursue genetic testing for a panel that includes genes associated with breast and kidney cancer.  ? ?Theresa Shannon elected to have [employer] (7 genes+ 5 kidney cancer genes). The [employer]

## 2021-11-19 ENCOUNTER — Telehealth: Payer: Self-pay | Admitting: Hematology and Oncology

## 2021-11-19 NOTE — Pre-Procedure Instructions (Signed)
Surgical Instructions ? ? ? Your procedure is scheduled on Thursday, May 18. ? Report to [facility] Main Entrance "A" at 7:00 A.M., then check in with the Admitting office. ? Call this number if you have problems the morning of surgery: ? [phone] ? ? If you have any questions prior to your surgery date call [phone]: Open Monday-Friday 8am-4pm ? ? ? Remember: ? Do not eat after midnight the night before your surgery ? ?You may drink clear liquids until 6:00AM the morning of your surgery.   ?Clear liquids allowed are: Water, Non-Citrus Juices (without pulp), Carbonated Beverages, Clear Tea, Black Coffee ONLY (NO MILK, CREAM OR POWDERED CREAMER of any kind), and Gatorade ? ?Patient Instructions ? ?The night before surgery:  ?No food after midnight. ONLY clear liquids after midnight ? ? ?The day of surgery (if you have diabetes): ?Drink ONE (1) 12 oz G2 given to you in your pre admission testing appointment by 6:00AM the morning of surgery. Drink in one sitting. Do not sip.  ?This drink was given to you during your hospital  ?pre-op appointment visit.  ?Nothing else to drink after completing the  ?12 oz bottle of G2. ? ?       If you have questions, please contact your surgeon?s office. ? ?  ? Take these medicines the morning of surgery with A SIP OF WATER:  ?atenolol (TENORMIN)  ?levothyroxine (SYNTHROID, LEVOTHROID) 175  ?acetaminophen (TYLENOL)  if needed ? ?Follow your surgeon's instructions on when to stop Aspirin.  If no instructions were given by your surgeon then you will need to call the office to get those instructions.   ? ?As of today, STOP taking any Aleve, Naproxen, Ibuprofen, Motrin, Advil, Goody's, BC's, all herbal medications, fish oil, and all vitamins. ? ?WHAT DO I DO ABOUT MY DIABETES MEDICATION? ? ? ?Do not take oral diabetes medicines (pills) the morning of surgery. DO NOT TAKE Metformin (Glucophage) the day of surgery.  ? ? ?HOW TO MANAGE YOUR DIABETES ?BEFORE AND AFTER SURGERY ? ?Why is  it important to control my blood sugar before and after surgery? ?Improving blood sugar levels before and after surgery helps healing and can limit problems. ?A way of improving blood sugar control is eating a healthy diet by: ? Eating less sugar and carbohydrates ? Increasing activity/exercise ? Talking with your doctor about reaching your blood sugar goals ?High blood sugars (greater than 180 mg/dL) can raise your risk of infections and slow your recovery, so you will need to focus on controlling your diabetes during the weeks before surgery. ?Make sure that the doctor who takes care of your diabetes knows about your planned surgery including the date and location. ? ?How do I manage my blood sugar before surgery? ?Check your blood sugar at least 4 times a day, starting 2 days before surgery, to make sure that the level is not too high or low. ? ?Check your blood sugar the morning of your surgery when you wake up and every 2 hours until you get to the Short Stay unit. ? ?If your blood sugar is less than 70 mg/dL, you will need to treat for low blood sugar: ?Do not take insulin. ?Treat a low blood sugar (less than 70 mg/dL) with ? cup of clear juice (cranberry or apple), 4 glucose tablets, OR glucose gel. ?Recheck blood sugar in 15 minutes after treatment (to make sure it is greater than 70 mg/dL). If your blood sugar is not greater than 70 mg/dL  on recheck, call [phone] for further instructions. ?Report your blood sugar to the short stay nurse when you get to Short Stay. ? ?If you are admitted to the hospital after surgery: ?Your blood sugar will be checked by the staff and you will probably be given insulin after surgery (instead of oral diabetes medicines) to make sure you have good blood sugar levels. ?The goal for blood sugar control after surgery is 80-180 mg/dL. ? ? ?         ?Do not wear jewelry or makeup ?Do not wear lotions, powders, perfumes/colognes, or deodorant. ?Do not shave 48 hours prior to  surgery.  Men may shave face and neck. ?Do not bring valuables to the hospital. ?Do not wear nail polish, gel polish, artificial nails, or any other type of covering on natural nails (fingers and toes) ?If you have artificial nails or gel coating that need to be removed by a nail salon, please have this removed prior to surgery. Artificial nails or gel coating may interfere with anesthesia's ability to adequately monitor your vital signs. ? ?[facility] is not responsible for any belongings or valuables. .  ? ?Do NOT Smoke (Tobacco/Vaping)  24 hours prior to your procedure ? ?If you use a CPAP at night, you may bring your mask for your overnight stay. ?  ?Contacts, glasses, hearing aids, dentures or partials may not be worn into surgery, please bring cases for these belongings ?  ?For patients admitted to the hospital, discharge time will be determined by your treatment team. ?  ?Patients discharged the day of surgery will not be allowed to drive home, and someone needs to stay with them for 24 hours. ? ? ?SURGICAL WAITING ROOM VISITATION ?Patients having surgery or a procedure in a hospital may have two support people. ?Children under the age of 52 must have an adult with them who is not the patient. ?They may stay in the waiting area during the procedure and may switch out with other visitors. If the patient needs to stay at the hospital during part of their recovery, the visitor guidelines for inpatient rooms apply. ? ?Please refer to the [facility] website for the visitor guidelines for Inpatients (after your surgery is over and you are in a regular room).  ? ? ? ? ? ?Special instructions:   ? ?Oral Hygiene is also important to reduce your risk of infection.  Remember - BRUSH YOUR TEETH THE MORNING OF SURGERY WITH YOUR REGULAR TOOTHPASTE ? ? ?[facility]- Preparing For Surgery ? ?Before surgery, you can play an important role. Because skin is not sterile, your skin needs to be as free of germs as possible. You  can reduce the number of germs on your skin by washing with CHG (chlorahexidine gluconate) Soap before surgery.  CHG is an antiseptic cleaner which kills germs and bonds with the skin to continue killing germs even after washing.   ? ? ?Please do not use if you have an allergy to CHG or antibacterial soaps. If your skin becomes reddened/irritated stop using the CHG.  ?Do not shave (including legs and underarms) for at least 48 hours prior to first CHG shower. It is OK to shave your face. ? ?Please follow these instructions carefully. ?  ? ? Shower the NIGHT BEFORE SURGERY and the MORNING OF SURGERY with CHG Soap.  ? If you chose to wash your hair, wash your hair first as usual with your normal shampoo. After you shampoo, rinse your hair and body  thoroughly to remove the shampoo.  Then [employer] and genitals (private parts) with your normal soap and rinse thoroughly to remove soap. ? ?After that Use CHG Soap as you would any other liquid soap. You can apply CHG directly to the skin and wash gently with a scrungie or a clean washcloth.  ? ?Apply the CHG Soap to your body ONLY FROM THE NECK DOWN.  Do not use on open wounds or open sores. Avoid contact with your eyes, ears, mouth and genitals (private parts). Wash Face and genitals (private parts)  with your normal soap.  ? ?Wash thoroughly, paying special attention to the area where your surgery will be performed. ? ?Thoroughly rinse your body with warm water from the neck down. ? ?DO NOT shower/wash with your normal soap after using and rinsing off the CHG Soap. ? ?Pat yourself dry with a CLEAN TOWEL. ? ?Wear CLEAN PAJAMAS to bed the night before surgery ? ?Place CLEAN SHEETS on your bed the night before your surgery ? ?DO NOT SLEEP WITH PETS. ? ? ?Day of Surgery: ? ?Take a shower with CHG soap. ?Wear Clean/Comfortable clothing the morning of surgery ?Do not apply any deodorants/lotions.   ?Remember to brush your teeth WITH YOUR REGULAR TOOTHPASTE. ? ? ? ?If you  received a COVID test during your pre-op visit, it is requested that you wear a mask when out in public, stay away from anyone that may not be feeling well, and notify your surgeon if you develop symptoms. I

## 2021-11-19 NOTE — Telephone Encounter (Signed)
Scheduled per 5/11 in basket, pt has been called and confirmed  ?

## 2021-11-20 ENCOUNTER — Other Ambulatory Visit: Payer: Self-pay

## 2021-11-20 ENCOUNTER — Encounter (HOSPITAL_COMMUNITY): Payer: Self-pay

## 2021-11-20 ENCOUNTER — Encounter (HOSPITAL_COMMUNITY)
Admission: RE | Admit: 2021-11-20 | Discharge: 2021-11-20 | Disposition: A | Discharge status: Home / Self Care | Payer: Federal, State, Local not specified - PPO | Source: Ambulatory Visit | Attending: General Surgery | Admitting: General Surgery

## 2021-11-20 VITALS — BP 161/58 | HR 63 | Temp 97.7°F | Resp 18 | Ht 63.0 in | Wt 168.7 lb

## 2021-11-20 DIAGNOSIS — Z803 Family history of malignant neoplasm of breast: Secondary | ICD-10-CM | POA: Diagnosis not present

## 2021-11-20 DIAGNOSIS — Z01812 Encounter for preprocedural laboratory examination: Secondary | ICD-10-CM | POA: Insufficient documentation

## 2021-11-20 DIAGNOSIS — C50911 Malignant neoplasm of unspecified site of right female breast: Secondary | ICD-10-CM | POA: Diagnosis not present

## 2021-11-20 DIAGNOSIS — Z170 Estrogen receptor positive status [ER+]: Secondary | ICD-10-CM | POA: Diagnosis not present

## 2021-11-20 DIAGNOSIS — Z01818 Encounter for other preprocedural examination: Secondary | ICD-10-CM

## 2021-11-20 DIAGNOSIS — E119 Type 2 diabetes mellitus without complications: Secondary | ICD-10-CM | POA: Diagnosis not present

## 2021-11-20 DIAGNOSIS — Z7984 Long term (current) use of oral hypoglycemic drugs: Secondary | ICD-10-CM | POA: Diagnosis not present

## 2021-11-20 DIAGNOSIS — D0511 Intraductal carcinoma in situ of right breast: Secondary | ICD-10-CM | POA: Diagnosis present

## 2021-11-20 HISTORY — DX: Peripheral vascular disease, unspecified: I73.9

## 2021-11-20 LAB — HEMOGLOBIN A1C
Hgb A1c MFr Bld: 7.4 % — ABNORMAL HIGH (ref 4.8–5.6)
Mean Plasma Glucose: 165.68 mg/dL

## 2021-11-20 LAB — GLUCOSE, CAPILLARY: Glucose-Capillary: 183 mg/dL — ABNORMAL HIGH (ref 70–99)

## 2021-11-20 NOTE — Pre-Procedure Instructions (Signed)
Surgical Instructions ? ? ? Your procedure is scheduled on Thursday, May 18th, 2023 ? ? Report to [facility] Main Entrance "A" at 7:00 A.M., then check in with the Admitting office. ? Call this number if you have problems the morning of surgery: ? [phone] ? ? If you have any questions prior to your surgery date call [phone]: Open Monday-Friday 8am-4pm ? ? ? Remember: ? Do not eat after midnight the night before your surgery ? ?You may drink clear liquids until 6:00AM the morning of your surgery.   ?Clear liquids allowed are: Water, Non-Citrus Juices (without pulp), Carbonated Beverages, Clear Tea, Black Coffee ONLY (NO MILK, CREAM OR POWDERED CREAMER of any kind), and Gatorade ? ?Patient Instructions ? ?The night before surgery:  ?No food after midnight. ONLY clear liquids after midnight ? ? ?The day of surgery (if you have diabetes): ?Drink ONE (1) 12 oz G2 given to you in your pre admission testing appointment by 6:00AM the morning of surgery. Drink in one sitting. Do not sip.  ?This drink was given to you during your hospital  ?pre-op appointment visit.  ?Nothing else to drink after completing the  ?12 oz bottle of G2. ? ?       If you have questions, please contact your surgeon?s office. ? ? Take these medicines the morning of surgery with A SIP OF WATER:  ? ?atenolol (TENORMIN)  ?levothyroxine (SYNTHROID, LEVOTHROID) 175  ?acetaminophen (TYLENOL)  if needed ? ?Follow your surgeon's instructions on when to stop Aspirin.  If no instructions were given by your surgeon then you will need to call the office to get those instructions.   ? ?As of today, STOP taking any Aleve, Naproxen, Ibuprofen, Motrin, Advil, Goody's, BC's, all herbal medications, fish oil, and all vitamins. ? ?WHAT DO I DO ABOUT MY DIABETES MEDICATION? ? ? ? DO NOT TAKE Metformin (Glucophage) the day of surgery.  ? ? ?HOW TO MANAGE YOUR DIABETES ?BEFORE AND AFTER SURGERY ? ?Why is it important to control my blood sugar before and after  surgery? ?Improving blood sugar levels before and after surgery helps healing and can limit problems. ?A way of improving blood sugar control is eating a healthy diet by: ? Eating less sugar and carbohydrates ? Increasing activity/exercise ? Talking with your doctor about reaching your blood sugar goals ?High blood sugars (greater than 180 mg/dL) can raise your risk of infections and slow your recovery, so you will need to focus on controlling your diabetes during the weeks before surgery. ?Make sure that the doctor who takes care of your diabetes knows about your planned surgery including the date and location. ? ?How do I manage my blood sugar before surgery? ?Check your blood sugar at least 4 times a day, starting 2 days before surgery, to make sure that the level is not too high or low. ? ?Check your blood sugar the morning of your surgery when you wake up and every 2 hours until you get to the Short Stay unit. ? ?If your blood sugar is less than 70 mg/dL, you will need to treat for low blood sugar: ?Do not take insulin. ?Treat a low blood sugar (less than 70 mg/dL) with ? cup of clear juice (cranberry or apple), 4 glucose tablets, OR glucose gel. ?Recheck blood sugar in 15 minutes after treatment (to make sure it is greater than 70 mg/dL). If your blood sugar is not greater than 70 mg/dL on recheck, call [phone] for further instructions. ?Report your  blood sugar to the short stay nurse when you get to Short Stay. ? ?If you are admitted to the hospital after surgery: ?Your blood sugar will be checked by the staff and you will probably be given insulin after surgery (instead of oral diabetes medicines) to make sure you have good blood sugar levels. ?The goal for blood sugar control after surgery is 80-180 mg/dL. ? ? The day of surgery: ?         ?Do not wear jewelry or makeup ?Do not wear lotions, powders, perfumes, or deodorant. ?Do not shave 48 hours prior to surgery.   ?Do not bring valuables to the  hospital. ?Do not wear nail polish, gel polish, artificial nails, or any other type of covering on natural nails (fingers and toes) ?If you have artificial nails or gel coating that need to be removed by a nail salon, please have this removed prior to surgery. Artificial nails or gel coating may interfere with anesthesia's ability to adequately monitor your vital signs. ? ?[facility] is not responsible for any belongings or valuables. .  ? ?Do NOT Smoke (Tobacco/Vaping)  24 hours prior to your procedure ? ?If you use a CPAP at night, you may bring your mask for your overnight stay. ?  ?Contacts, glasses, hearing aids, dentures or partials may not be worn into surgery, please bring cases for these belongings ?  ?For patients admitted to the hospital, discharge time will be determined by your treatment team. ?  ?Patients discharged the day of surgery will not be allowed to drive home, and someone needs to stay with them for 24 hours. ? ? ?SURGICAL WAITING ROOM VISITATION ?Patients having surgery or a procedure in a hospital may have two support people. ?Children under the age of 44 must have an adult with them who is not the patient. ?They may stay in the waiting area during the procedure and may switch out with other visitors. If the patient needs to stay at the hospital during part of their recovery, the visitor guidelines for inpatient rooms apply. ? ?Please refer to the [facility] website for the visitor guidelines for Inpatients (after your surgery is over and you are in a regular room).  ? ? ?Special instructions:   ? ?Oral Hygiene is also important to reduce your risk of infection.  Remember - BRUSH YOUR TEETH THE MORNING OF SURGERY WITH YOUR REGULAR TOOTHPASTE ? ? ?[facility]- Preparing For Surgery ? ?Before surgery, you can play an important role. Because skin is not sterile, your skin needs to be as free of germs as possible. You can reduce the number of germs on your skin by washing with CHG  (chlorahexidine gluconate) Soap before surgery.  CHG is an antiseptic cleaner which kills germs and bonds with the skin to continue killing germs even after washing.   ? ? ?Please do not use if you have an allergy to CHG or antibacterial soaps. If your skin becomes reddened/irritated stop using the CHG.  ?Do not shave (including legs and underarms) for at least 48 hours prior to first CHG shower. It is OK to shave your face. ? ?Please follow these instructions carefully. ?  ? ? Shower the NIGHT BEFORE SURGERY and the MORNING OF SURGERY with CHG Soap.  ? If you chose to wash your hair, wash your hair first as usual with your normal shampoo. After you shampoo, rinse your hair and body thoroughly to remove the shampoo.  Then [employer] and genitals (private parts)  with your normal soap and rinse thoroughly to remove soap. ? ?After that Use CHG Soap as you would any other liquid soap. You can apply CHG directly to the skin and wash gently with a scrungie or a clean washcloth.  ? ?Apply the CHG Soap to your body ONLY FROM THE NECK DOWN.  Do not use on open wounds or open sores. Avoid contact with your eyes, ears, mouth and genitals (private parts). Wash Face and genitals (private parts)  with your normal soap.  ? ?Wash thoroughly, paying special attention to the area where your surgery will be performed. ? ?Thoroughly rinse your body with warm water from the neck down. ? ?DO NOT shower/wash with your normal soap after using and rinsing off the CHG Soap. ? ?Pat yourself dry with a CLEAN TOWEL. ? ?Wear CLEAN PAJAMAS to bed the night before surgery ? ?Place CLEAN SHEETS on your bed the night before your surgery ? ?DO NOT SLEEP WITH PETS. ? ? ?Day of Surgery: ? ?Take a shower with CHG soap. ?Wear Clean/Comfortable clothing the morning of surgery ?Do not apply any deodorants/lotions.   ?Remember to brush your teeth WITH YOUR REGULAR TOOTHPASTE. ? ? ? ?If you received a COVID test during your pre-op visit, it is requested that  you wear a mask when out in public, stay away from anyone that may not be feeling well, and notify your surgeon if you develop symptoms. If you have been in contact with anyone that has tested positive in the last 10 day

## 2021-11-20 NOTE — Progress Notes (Addendum)
PCP - Adine Madura, MD ?Cardiologist -  ?Hematology and Oncology - Benay Pike, MD ? ?PPM/ICD - denies ?Device Orders - n/a ?Rep Notified - n/a ? ?Chest x-ray - n/a ?EKG - 11/20/2021 ?Stress Test - 11/29/2016 ?ECHO - 05/27/2007 ?Cardiac Cath - denies ? ?Sleep Study - denies ?CPAP - n/a ? ?Fasting Blood Sugar - 130 ?Checks Blood Sugar once a day ?CBG today - 183 ?A1C - done in PAT on 11/20/2021 ? ?Blood Thinner Instructions: n/a ? ?Aspirin Instructions: - patient was instructed to call MD and ask if she must hold Aspirin prior surgery. Patient verbalized understanding. ? ?Patient was instructed: As of today, STOP taking any Aleve, Naproxen, Ibuprofen, Motrin, Advil, Goody's, BC's, all herbal medications, fish oil, and all vitamins. ?  ?ERAS Protcol - yes ?PRE-SURGERY G2- until 06:00 o'clock ? ?COVID TEST- n/a ? ? ?Anesthesia review: yes - hx of Stroke, seed implantation ? ?Patient denies shortness of breath, fever, cough and chest pain at PAT appointment ? ? ?All instructions explained to the patient, with a verbal understanding of the material. Patient agrees to go over the instructions while at home for a better understanding. Patient also instructed to self quarantine after being tested for COVID-19. The opportunity to ask questions was provided. ?  ?

## 2021-11-22 ENCOUNTER — Other Ambulatory Visit: Payer: Self-pay

## 2021-11-22 ENCOUNTER — Ambulatory Visit (HOSPITAL_COMMUNITY): Payer: Federal, State, Local not specified - PPO | Admitting: Anesthesiology

## 2021-11-22 ENCOUNTER — Encounter (HOSPITAL_COMMUNITY): Payer: Self-pay | Admitting: General Surgery

## 2021-11-22 ENCOUNTER — Encounter (HOSPITAL_COMMUNITY)
Admission: RE | Disposition: A | Discharge status: Home / Self Care | Payer: Self-pay | Source: Home / Self Care | Attending: General Surgery

## 2021-11-22 ENCOUNTER — Ambulatory Visit (HOSPITAL_COMMUNITY)
Admission: RE | Admit: 2021-11-22 | Discharge: 2021-11-22 | Disposition: A | Discharge status: Home / Self Care | Payer: Federal, State, Local not specified - PPO | Attending: General Surgery | Admitting: General Surgery

## 2021-11-22 ENCOUNTER — Ambulatory Visit (HOSPITAL_COMMUNITY): Payer: Federal, State, Local not specified - PPO | Admitting: Physician Assistant

## 2021-11-22 DIAGNOSIS — Z01818 Encounter for other preprocedural examination: Secondary | ICD-10-CM

## 2021-11-22 DIAGNOSIS — Z803 Family history of malignant neoplasm of breast: Secondary | ICD-10-CM | POA: Insufficient documentation

## 2021-11-22 DIAGNOSIS — C50911 Malignant neoplasm of unspecified site of right female breast: Secondary | ICD-10-CM | POA: Insufficient documentation

## 2021-11-22 DIAGNOSIS — Z7984 Long term (current) use of oral hypoglycemic drugs: Secondary | ICD-10-CM | POA: Insufficient documentation

## 2021-11-22 DIAGNOSIS — Z170 Estrogen receptor positive status [ER+]: Secondary | ICD-10-CM | POA: Insufficient documentation

## 2021-11-22 DIAGNOSIS — E119 Type 2 diabetes mellitus without complications: Secondary | ICD-10-CM | POA: Insufficient documentation

## 2021-11-22 HISTORY — PX: BREAST LUMPECTOMY WITH RADIOACTIVE SEED LOCALIZATION: SHX6424

## 2021-11-22 LAB — GLUCOSE, CAPILLARY
Glucose-Capillary: 146 mg/dL — ABNORMAL HIGH (ref 70–99)
Glucose-Capillary: 136 mg/dL — ABNORMAL HIGH (ref 70–99)

## 2021-11-22 SURGERY — BREAST LUMPECTOMY WITH RADIOACTIVE SEED LOCALIZATION
Anesthesia: General | Laterality: Right

## 2021-11-22 MED ORDER — FENTANYL CITRATE (PF) 100 MCG/2ML IJ SOLN
25.0000 ug | INTRAMUSCULAR | Status: DC | PRN
  Administered 2021-11-22: 25 ug via INTRAVENOUS

## 2021-11-22 MED ORDER — ACETAMINOPHEN 500 MG PO TABS: 1000.0000 mg | ORAL_TABLET | ORAL | Status: AC

## 2021-11-22 MED ORDER — ONDANSETRON HCL 4 MG/2ML IJ SOLN
INTRAMUSCULAR | Status: AC
  Filled 2021-11-22: qty 2

## 2021-11-22 MED ORDER — FENTANYL CITRATE (PF) 100 MCG/2ML IJ SOLN
INTRAMUSCULAR | Status: AC
  Filled 2021-11-22: qty 2

## 2021-11-22 MED ORDER — EPHEDRINE SULFATE-NACL 50-0.9 MG/10ML-% IV SOSY
PREFILLED_SYRINGE | INTRAVENOUS | Status: DC | PRN
  Administered 2021-11-22: 10 mg via INTRAVENOUS
  Administered 2021-11-22: 5 mg via INTRAVENOUS

## 2021-11-22 MED ORDER — INSULIN ASPART 100 UNIT/ML IJ SOLN: 0.0000 [IU] | INTRAMUSCULAR | Status: DC | PRN

## 2021-11-22 MED ORDER — ORAL CARE MOUTH RINSE
15.0000 mL | Freq: Once | OROMUCOSAL | Status: DC
Start: 2021-11-22 — End: 2021-11-22

## 2021-11-22 MED ORDER — LIDOCAINE 2% (20 MG/ML) 5 ML SYRINGE
INTRAMUSCULAR | Status: DC | PRN
  Administered 2021-11-22: 40 mg via INTRAVENOUS

## 2021-11-22 MED ORDER — 0.9 % SODIUM CHLORIDE (POUR BTL) OPTIME
TOPICAL | Status: DC | PRN
Start: 2021-11-22 — End: 2021-11-22
  Administered 2021-11-22: 500 mL

## 2021-11-22 MED ORDER — GLYCOPYRROLATE 0.2 MG/ML IJ SOLN
INTRAMUSCULAR | Status: DC | PRN
  Administered 2021-11-22: .2 mg via INTRAVENOUS

## 2021-11-22 MED ORDER — FENTANYL CITRATE (PF) 250 MCG/5ML IJ SOLN
INTRAMUSCULAR | Status: DC | PRN
Start: 2021-11-22 — End: 2021-11-22
  Administered 2021-11-22 (×2): 50 ug via INTRAVENOUS

## 2021-11-22 MED ORDER — CHLORHEXIDINE GLUCONATE 0.12 % MT SOLN
15.0000 mL | Freq: Once | OROMUCOSAL | Status: DC
Start: 2021-11-22 — End: 2021-11-22

## 2021-11-22 MED ORDER — CHLORHEXIDINE GLUCONATE 0.12 % MT SOLN
OROMUCOSAL | Status: AC
  Administered 2021-11-22: 15 mL
  Filled 2021-11-22: qty 15

## 2021-11-22 MED ORDER — PROPOFOL 10 MG/ML IV BOLUS
INTRAVENOUS | Status: AC
  Filled 2021-11-22: qty 20

## 2021-11-22 MED ORDER — PHENYLEPHRINE 80 MCG/ML (10ML) SYRINGE FOR IV PUSH (FOR BLOOD PRESSURE SUPPORT)
PREFILLED_SYRINGE | INTRAVENOUS | Status: DC | PRN
Start: 2021-11-22 — End: 2021-11-22
  Administered 2021-11-22: 160 ug via INTRAVENOUS

## 2021-11-22 MED ORDER — KETOROLAC TROMETHAMINE 15 MG/ML IJ SOLN
INTRAMUSCULAR | Status: DC | PRN
  Administered 2021-11-22: 15 mg via INTRAVENOUS

## 2021-11-22 MED ORDER — DEXAMETHASONE SODIUM PHOSPHATE 10 MG/ML IJ SOLN
INTRAMUSCULAR | Status: AC
  Filled 2021-11-22: qty 1

## 2021-11-22 MED ORDER — ONDANSETRON HCL 4 MG/2ML IJ SOLN
INTRAMUSCULAR | Status: DC | PRN
  Administered 2021-11-22: 4 mg via INTRAVENOUS

## 2021-11-22 MED ORDER — DEXAMETHASONE SODIUM PHOSPHATE 10 MG/ML IJ SOLN
INTRAMUSCULAR | Status: DC | PRN
  Administered 2021-11-22: 10 mg via INTRAVENOUS

## 2021-11-22 MED ORDER — ROCURONIUM BROMIDE 10 MG/ML (PF) SYRINGE
PREFILLED_SYRINGE | INTRAVENOUS | Status: AC
  Filled 2021-11-22: qty 10

## 2021-11-22 MED ORDER — CEFAZOLIN SODIUM-DEXTROSE 2-4 GM/100ML-% IV SOLN
2.0000 g | INTRAVENOUS | Status: AC
  Administered 2021-11-22: 2 g via INTRAVENOUS

## 2021-11-22 MED ORDER — PROPOFOL 10 MG/ML IV BOLUS
INTRAVENOUS | Status: DC | PRN
Start: 2021-11-22 — End: 2021-11-22
  Administered 2021-11-22: 150 mg via INTRAVENOUS

## 2021-11-22 MED ORDER — BUPIVACAINE-EPINEPHRINE (PF) 0.25% -1:200000 IJ SOLN
INTRAMUSCULAR | Status: AC
  Filled 2021-11-22: qty 30

## 2021-11-22 MED ORDER — CHLORHEXIDINE GLUCONATE CLOTH 2 % EX PADS: 6.0000 | MEDICATED_PAD | Freq: Once | CUTANEOUS | Status: DC

## 2021-11-22 MED ORDER — AMISULPRIDE (ANTIEMETIC) 5 MG/2ML IV SOLN: 10.0000 mg | Freq: Once | INTRAVENOUS | Status: DC | PRN

## 2021-11-22 MED ORDER — LIDOCAINE 2% (20 MG/ML) 5 ML SYRINGE
INTRAMUSCULAR | Status: AC
  Filled 2021-11-22: qty 5

## 2021-11-22 MED ORDER — ENSURE PRE-SURGERY PO LIQD: 296.0000 mL | Freq: Once | ORAL | Status: DC

## 2021-11-22 MED ORDER — BUPIVACAINE-EPINEPHRINE 0.25% -1:200000 IJ SOLN
INTRAMUSCULAR | Status: DC | PRN
  Administered 2021-11-22: 10 mL

## 2021-11-22 MED ORDER — LACTATED RINGERS IV SOLN: INTRAVENOUS | Status: DC

## 2021-11-22 MED ORDER — CEFAZOLIN SODIUM-DEXTROSE 2-4 GM/100ML-% IV SOLN
INTRAVENOUS | Status: AC
  Filled 2021-11-22: qty 100

## 2021-11-22 MED ORDER — FENTANYL CITRATE (PF) 250 MCG/5ML IJ SOLN
INTRAMUSCULAR | Status: AC
  Filled 2021-11-22: qty 5

## 2021-11-22 MED ORDER — ACETAMINOPHEN 500 MG PO TABS
ORAL_TABLET | ORAL | Status: AC
Start: 2021-11-22 — End: 2021-11-22
  Administered 2021-11-22: 1000 mg
  Filled 2021-11-22: qty 2

## 2021-11-22 SURGICAL SUPPLY — 38 items
APPLIER CLIP 9.375 MED OPEN (MISCELLANEOUS) ×2
BAG COUNTER SPONGE SURGICOUNT (BAG) ×2 IMPLANT
CANISTER SUCT 3000ML PPV (MISCELLANEOUS) ×2 IMPLANT
CHLORAPREP W/TINT 26 (MISCELLANEOUS) ×2 IMPLANT
CLIP APPLIE 9.375 MED OPEN (MISCELLANEOUS) IMPLANT
CLIP VESOCCLUDE MED 6/CT (CLIP) ×2 IMPLANT
CNTNR URN SCR LID CUP LEK RST (MISCELLANEOUS) IMPLANT
CONT SPEC 4OZ STRL OR WHT (MISCELLANEOUS) ×4
COVER PROBE W GEL 5X96 (DRAPES) ×2 IMPLANT
COVER SURGICAL LIGHT HANDLE (MISCELLANEOUS) ×2 IMPLANT
DERMABOND ADVANCED (GAUZE/BANDAGES/DRESSINGS) ×1
DERMABOND ADVANCED .7 DNX12 (GAUZE/BANDAGES/DRESSINGS) ×1 IMPLANT
DEVICE DUBIN SPECIMEN MAMMOGRA (MISCELLANEOUS) ×2 IMPLANT
DRAPE CHEST BREAST 15X10 FENES (DRAPES) ×2 IMPLANT
ELECT COATED BLADE 2.86 ST (ELECTRODE) ×2 IMPLANT
ELECT REM PT RETURN 9FT ADLT (ELECTROSURGICAL) ×2
ELECTRODE REM PT RTRN 9FT ADLT (ELECTROSURGICAL) ×1 IMPLANT
GLOVE BIO SURGEON STRL SZ7 (GLOVE) ×4 IMPLANT
GLOVE BIOGEL PI IND STRL 7.5 (GLOVE) ×1 IMPLANT
GLOVE BIOGEL PI INDICATOR 7.5 (GLOVE) ×1
GOWN STRL REUS W/ TWL LRG LVL3 (GOWN DISPOSABLE) ×2 IMPLANT
GOWN STRL REUS W/TWL LRG LVL3 (GOWN DISPOSABLE) ×4
KIT BASIN OR (CUSTOM PROCEDURE TRAY) ×2 IMPLANT
KIT MARKER MARGIN INK (KITS) ×2 IMPLANT
NDL HYPO 25GX1X1/2 BEV (NEEDLE) ×1 IMPLANT
NEEDLE HYPO 25GX1X1/2 BEV (NEEDLE) ×2 IMPLANT
NS IRRIG 1000ML POUR BTL (IV SOLUTION) ×2 IMPLANT
PACK GENERAL/GYN (CUSTOM PROCEDURE TRAY) ×2 IMPLANT
STRIP CLOSURE SKIN 1/2X4 (GAUZE/BANDAGES/DRESSINGS) ×2 IMPLANT
SUT MNCRL AB 4-0 PS2 18 (SUTURE) ×2 IMPLANT
SUT MON AB 5-0 P3 18 (SUTURE) ×1 IMPLANT
SUT SILK 2 0 PERMA HAND 18 BK (SUTURE) ×1 IMPLANT
SUT VIC AB 2-0 SH 27 (SUTURE) ×4
SUT VIC AB 2-0 SH 27X BRD (SUTURE) IMPLANT
SUT VIC AB 3-0 SH 27 (SUTURE) ×1
SUT VIC AB 3-0 SH 27X BRD (SUTURE) ×1 IMPLANT
SYR CONTROL 10ML LL (SYRINGE) ×2 IMPLANT
TOWEL GREEN STERILE FF (TOWEL DISPOSABLE) ×2 IMPLANT

## 2021-11-22 NOTE — Discharge Instructions (Signed)
[facility] Office Phone Number [phone]  POST OP INSTRUCTIONS Take 400 mg of ibuprofen every 8 hours or 650 mg tylenol every 6 hours for next 72 hours then as needed. Use ice several times daily also.  A prescription for pain medication may be given to you upon discharge.  Take your pain medication as prescribed, if needed.  If narcotic pain medicine is not needed, then you may take acetaminophen (Tylenol), naprosyn (Alleve) or ibuprofen (Advil) as needed. Take your usually prescribed medications unless otherwise directed If you need a refill on your pain medication, please contact your pharmacy.  They will contact our office to request authorization.  Prescriptions will not be filled after 5pm or on week-ends. You should eat very light the first 24 hours after surgery, such as soup, crackers, pudding, etc.  Resume your normal diet the day after surgery. Most patients will experience some swelling and bruising in the breast.  Ice packs and a good support bra will help.  Wear the breast binder provided or a sports bra for 72 hours day and night.  After that wear a sports bra during the day until you return to the office. Swelling and bruising can take several days to resolve.  It is common to experience some constipation if taking pain medication after surgery.  Increasing fluid intake and taking a stool softener will usually help or prevent this problem from occurring.  A mild laxative (Milk of Magnesia or Miralax) should be taken according to package directions if there are no bowel movements after 48 hours. I used skin glue on the incision, you may shower in 24 hours.  The glue will flake off over the next 2-3 weeks.  Any sutures or staples will be removed at the office during your follow-up visit. ACTIVITIES:  You may resume regular daily activities (gradually increasing) beginning the next day.  Wearing a good support bra or sports bra minimizes pain and swelling.  You may have  sexual intercourse when it is comfortable. You may drive when you no longer are taking prescription pain medication, you can comfortably wear a seatbelt, and you can safely maneuver your car and apply brakes. RETURN TO WORK:  ______________________________________________________________________________________ You should see your doctor in the office for a follow-up appointment approximately two weeks after your surgery.  Your doctor's nurse will typically make your follow-up appointment when she calls you with your pathology report.  Expect your pathology report 3-4 business days after your surgery.  You may call to check if you do not hear from us after three days. OTHER INSTRUCTIONS: _______________________________________________________________________________________________ _____________________________________________________________________________________________________________________________________ _____________________________________________________________________________________________________________________________________ _____________________________________________________________________________________________________________________________________  WHEN TO CALL DR Kobi Aller: Fever over 101.0 Nausea and/or vomiting. Extreme swelling or bruising. Continued bleeding from incision. Increased pain, redness, or drainage from the incision.  The clinic staff is available to answer your questions during regular business hours.  Please don't hesitate to call and ask to speak to one of the nurses for clinical concerns.  If you have a medical emergency, go to the nearest emergency room or call 911.  A surgeon from [facility] is always on call at the hospital.  For further questions, please visit [email or web address]  

## 2021-11-22 NOTE — Anesthesia Preprocedure Evaluation (Signed)
Anesthesia Evaluation  Patient identified by MRN, date of birth, ID band Patient awake    Reviewed: Allergy & Precautions, NPO status , Patient's Chart, lab work & pertinent test results  Airway Mallampati: II  TM Distance: >3 FB Neck ROM: Full    Dental  (+) Dental Advisory Given   Pulmonary neg pulmonary ROS,    breath sounds clear to auscultation       Cardiovascular hypertension, Pt. on medications and Pt. on home beta blockers + Peripheral Vascular Disease   Rhythm:Regular Rate:Normal     Neuro/Psych  Headaches, CVA    GI/Hepatic Neg liver ROS, hiatal hernia, GERD  ,  Endo/Other  diabetes, Type 2, Oral Hypoglycemic AgentsHypothyroidism   Renal/GU CRFRenal disease     Musculoskeletal   Abdominal   Peds  Hematology  (+) Blood dyscrasia, anemia ,   Anesthesia Other Findings   Reproductive/Obstetrics                             Lab Results  Component Value Date   WBC 4.0 11/14/2021   HGB 11.4 (L) 11/14/2021   HCT 36.4 11/14/2021   MCV 92.6 11/14/2021   PLT 207 11/14/2021   Lab Results  Component Value Date   CREATININE 1.73 (H) 11/14/2021   BUN 31 (H) 11/14/2021   NA 140 11/14/2021   K 5.0 11/14/2021   CL 108 11/14/2021   CO2 24 11/14/2021    Anesthesia Physical Anesthesia Plan  ASA: 3  Anesthesia Plan: General   Post-op Pain Management: Tylenol PO (pre-op)* and Toradol IV (intra-op)*   Induction: Intravenous  PONV Risk Score and Plan: 3 and Dexamethasone, Ondansetron, Midazolam and Treatment may vary due to age or medical condition  Airway Management Planned: LMA  Additional Equipment: None  Intra-op Plan:   Post-operative Plan: Extubation in OR  Informed Consent: I have reviewed the patients History and Physical, chart, labs and discussed the procedure including the risks, benefits and alternatives for the proposed anesthesia with the patient or authorized  representative who has indicated his/her understanding and acceptance.     Dental advisory given  Plan Discussed with: CRNA  Anesthesia Plan Comments:         Anesthesia Quick Evaluation

## 2021-11-22 NOTE — H&P (Signed)
76 y.o. female who is seen today as an office consultation for evaluation of Breast Cancer She has family history in her mom at age 6, sister age 57. She has b density breasts. She has 1.7 cm of calcifications in the right breast centrally. There are other calcifications that are benign concordant. 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  Daughter is Insurance claims handler who is rad Editor, commissioning at [employer]. She also has FH of renal cancer in sister.  I have reviewed mm/us/pathology and concur. I have discussed with med onc, rad onc, pathology and radiology today as well.   Review of Systems: A complete review of systems was obtained from the patient. I have reviewed this information and discussed as appropriate with the patient. See HPI as well for other ROS.  Review of Systems  HENT: Positive for congestion.  All other systems reviewed and are negative.   Medical History: Past Medical History:  Diagnosis Date   Diabetes mellitus without complication (CMS-HCC)   History of cancer   History of stroke   Hypertension   Thyroid disease   Patient Active Problem List  Diagnosis   Ductal carcinoma in situ (DCIS) of right breast   Past Surgical History:  Procedure Laterality Date   APPENDECTOMY   CHOLECYSTECTOMY   HYSTERECTOMY   JOINT REPLACEMENT    Allergies  Allergen Reactions   Amlodipine Rash and Swelling   Oxycodone-Acetaminophen Other (See Comments)  REACTION: Syncope   Current Outpatient Medications on File Prior to Visit  Medication Sig Dispense Refill   aspirin 81 MG chewable tablet Take by mouth   atenoloL (TENORMIN) 50 MG tablet atenolol 50 mg tablet   levothyroxine (SYNTHROID) 175 MCG tablet levothyroxine 175 mcg tablet   metFORMIN (GLUCOPHAGE) 500 MG tablet metformin 500 mg tablet   olmesartan-hydroCHLOROthiazide (BENICAR HCT) 40-25 mg tablet Take 1 tablet by mouth every morning   Family History  Problem Relation Age of Onset   Diabetes Mother    Breast cancer Mother   Stroke Father   Coronary Artery Disease (Blocked arteries around heart) Father   Breast cancer Sister   Diabetes Sister   Skin cancer Brother   Diabetes Brother    Social History   Tobacco Use  Smoking Status Never  Smokeless Tobacco Never    Social History   Socioeconomic History   Marital status: Married  Tobacco Use   Smoking status: Never   Smokeless tobacco: Never  Substance and Sexual Activity   Alcohol use: Not Currently   Drug use: Not Currently   Objective:  There were no vitals filed for this visit.  There is no height or weight on file to calculate BMI.  Physical Exam Vitals reviewed.  Constitutional:  Appearance: Normal appearance.  Chest:  Breasts: Right: No inverted nipple, mass or nipple discharge.  Left: No inverted nipple, mass or nipple discharge.  Lymphadenopathy:  Upper Body:  Right upper body: No supraclavicular or axillary adenopathy.  Left upper body: No supraclavicular or axillary adenopathy.  Neurological:  Mental Status: She is alert.   Assessment and Plan:   Ductal carcinoma in situ (DCIS) of right breast  Right breast seed guided lumpectomy  We discussed the staging and pathophysiology of breast cancer. We discussed all of the different options for treatment for breast cancer including surgery, chemotherapy, radiation therapy, Herceptin, and antiestrogen therapy. We discussed this is a stage 0 cancer.  We can omit a sentinel node as this is DCIS.  We discussed the options for treatment of the breast cancer which included lumpectomy versus a mastectomy. We discussed the performance of the lumpectomy with radioactive seed placement. We discussed a 5-10% chance of a positive margin requiring reexcision in the operating room. We also discussed that she will likely need radiation therapy if she undergoes lumpectomy. We discussed mastectomy and the postoperative care for that as well. Mastectomy can be followed by  reconstruction. The decision for lumpectomy vs mastectomy has no impact on decision for chemotherapy. Most mastectomy patients will not need radiation therapy. We discussed that there is no difference in her survival whether she undergoes lumpectomy with radiation therapy or antiestrogen therapy versus a mastectomy. There is also no real difference between her recurrence in the breast. We discussed the risks of operation including bleeding, infection, possible reoperation. She understands her further therapy will be based on what her stages at the time of her operation.

## 2021-11-22 NOTE — Anesthesia Postprocedure Evaluation (Signed)
Anesthesia Post Note  Patient: Theresa Shannon  Procedure(s) Performed: RIGHT BREAST LUMPECTOMY WITH RADIOACTIVE SEED LOCALIZATION (Right)     Patient location during evaluation: PACU Anesthesia Type: General Level of consciousness: awake and alert Pain management: pain level controlled Vital Signs Assessment: post-procedure vital signs reviewed and stable Respiratory status: spontaneous breathing, nonlabored ventilation, respiratory function stable and patient connected to nasal cannula oxygen Cardiovascular status: blood pressure returned to baseline and stable Postop Assessment: no apparent nausea or vomiting Anesthetic complications: no   No notable events documented.  Last Vitals:  Vitals:   11/22/21 1034 11/22/21 1049  BP: (!) 146/72 (!) 145/73  Pulse: (!) 54 (!) 51  Resp: 19 17  Temp:  36.5 C  SpO2: 95% 94%    Last Pain:  Vitals:   11/22/21 1039  PainSc: 5                  Tiajuana Amass

## 2021-11-22 NOTE — Transfer of Care (Signed)
Immediate Anesthesia Transfer of Care Note  Patient: Theresa Shannon  Procedure(s) Performed: RIGHT BREAST LUMPECTOMY WITH RADIOACTIVE SEED LOCALIZATION (Right)  Patient Location: PACU  Anesthesia Type:General  Level of Consciousness: awake, alert  and sedated  Airway & Oxygen Therapy: Patient connected to face mask oxygen  Post-op Assessment: Post -op Vital signs reviewed and stable  Post vital signs: stable  Last Vitals:  Vitals Value Taken Time  BP 111/47 11/22/21 1019  Temp    Pulse 51 11/22/21 1020  Resp 11 11/22/21 1020  SpO2 96 % 11/22/21 1020  Vitals shown include unvalidated device data.  Last Pain:  Vitals:   11/22/21 0723  PainSc: 0-No pain         Complications: No notable events documented.

## 2021-11-22 NOTE — Op Note (Signed)
Preoperative diagnosis: Clinical stage 0 right breast cancer Postoperative diagnosis: Same as above Procedure: Right breast radioactive seed guided lumpectomy Surgeon: Dr. Serita Grammes Anesthesia: General Estimated blood loss: Minimal Complications: None Drains: None Specimens: right breast lumpectomy containing seed and clip, additional medial and posterior margins marked short superior, long lateral double deep Sponge needle count was correct at completion Decision to recovery stable condition  Indications: 76 y.o. female who  has 1.7 cm of calcifications in the right breast centrally. There are other calcifications that are benign concordant. 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 We discussed lumpectomy.   Procedure: She first had a seed placed by radiology.  I had these mammograms available for my review.  After informed consent was obtained she was taken to the OR.   She was given antibiotics.  SCDs were placed.  She was then placed under general anesthesia without complication.  She was prepped and draped in the standard sterile surgical fashion.  A surgical timeout was then performed.  I made a periareolar incision in order to hide the scar later.  I then dissected to the seed. I removed the seed and the surrounding tissue with an attempt to get a clear margin. Mammogram confirmed removal of the seed and the clip.  I removed two additional margins as well. Hemostasis was observed. I closed this with 2-0 vicryl for the breast tissue. I then closed skin with 3-0 vicryl and 5-0 monocryl. Glue and Steri-Strips were applied.  she tolerated well, was extubated and transferred to recovery stable

## 2021-11-22 NOTE — Anesthesia Procedure Notes (Signed)
Procedure Name: LMA Insertion Date/Time: 11/22/2021 9:25 AM Performed by: Lavell Luster, CRNA Pre-anesthesia Checklist: Patient identified, Emergency Drugs available, Suction available, Patient being monitored and Timeout performed Patient Re-evaluated:Patient Re-evaluated prior to induction Oxygen Delivery Method: Circle system utilized Preoxygenation: Pre-oxygenation with 100% oxygen Induction Type: IV induction Ventilation: Mask ventilation without difficulty LMA: LMA inserted LMA Size: 4.0 Number of attempts: 1 Placement Confirmation: breath sounds checked- equal and bilateral and positive ETCO2 Tube secured with: Tape Dental Injury: Teeth and Oropharynx as per pre-operative assessment

## 2021-11-23 ENCOUNTER — Encounter: Payer: Self-pay | Admitting: *Deleted

## 2021-11-23 ENCOUNTER — Telehealth: Payer: Self-pay | Admitting: *Deleted

## 2021-11-23 ENCOUNTER — Encounter (HOSPITAL_COMMUNITY): Payer: Self-pay | Admitting: General Surgery

## 2021-11-23 LAB — SURGICAL PATHOLOGY

## 2021-11-23 NOTE — Telephone Encounter (Signed)
Spoke with patient to follow up from [facility] 5/10 and assess navigation needs. Patient denies any questions or concerns. She states she is doing well after surgery.  Encouraged her to call should she have anything arise. Patient verbalized understanding.

## 2021-11-28 ENCOUNTER — Encounter: Payer: Self-pay | Admitting: *Deleted

## 2021-12-05 ENCOUNTER — Encounter: Payer: Self-pay | Admitting: *Deleted

## 2021-12-07 ENCOUNTER — Telehealth: Payer: Self-pay | Admitting: Genetic Counselor

## 2021-12-07 ENCOUNTER — Encounter: Payer: Self-pay | Admitting: Genetic Counselor

## 2021-12-07 DIAGNOSIS — Z1379 Encounter for other screening for genetic and chromosomal anomalies: Secondary | ICD-10-CM | POA: Insufficient documentation

## 2021-12-07 NOTE — Telephone Encounter (Signed)
I contacted [name] to discuss her genetic testing results. No pathogenic variants were identified in the 52 genes analyzed. Detailed clinic note to follow.  The test report has been scanned into EPIC and is located under the Molecular Pathology section of the Results Review tab.  A portion of the result report is included below for reference.   Theresa Passy, MS, [facility] Genetic Counselor [email or web address] (P) [phone]

## 2021-12-08 ENCOUNTER — Ambulatory Visit (HOSPITAL_COMMUNITY)
Admission: EM | Admit: 2021-12-08 | Discharge: 2021-12-08 | Disposition: A | Discharge status: Home / Self Care | Payer: Federal, State, Local not specified - PPO | Attending: Family Medicine | Admitting: Family Medicine

## 2021-12-08 ENCOUNTER — Encounter (HOSPITAL_COMMUNITY): Payer: Self-pay | Admitting: Emergency Medicine

## 2021-12-08 DIAGNOSIS — R600 Localized edema: Secondary | ICD-10-CM | POA: Insufficient documentation

## 2021-12-08 LAB — COMPREHENSIVE METABOLIC PANEL
ALT: 26 U/L (ref 0–44)
AST: 36 U/L (ref 15–41)
Albumin: 3.7 g/dL (ref 3.5–5.0)
Alkaline Phosphatase: 46 U/L (ref 38–126)
Anion gap: 10 (ref 5–15)
BUN: 28 mg/dL — ABNORMAL HIGH (ref 8–23)
CO2: 22 mmol/L (ref 22–32)
Calcium: 9.9 mg/dL (ref 8.9–10.3)
Chloride: 109 mmol/L (ref 98–111)
Creatinine, Ser: 1.44 mg/dL — ABNORMAL HIGH (ref 0.44–1.00)
GFR, Estimated: 37 mL/min — ABNORMAL LOW (ref >60)
Glucose, Bld: 107 mg/dL — ABNORMAL HIGH (ref 70–99)
Potassium: 4.6 mmol/L (ref 3.5–5.1)
Sodium: 141 mmol/L (ref 135–145)
Total Bilirubin: 0.5 mg/dL (ref 0.3–1.2)
Total Protein: 6.9 g/dL (ref 6.5–8.1)

## 2021-12-08 LAB — POCT URINALYSIS DIPSTICK, ED / UC
Bilirubin Urine: NEGATIVE
Glucose, UA: NEGATIVE mg/dL
Hgb urine dipstick: NEGATIVE
Ketones, ur: NEGATIVE mg/dL
Nitrite: NEGATIVE
Protein, ur: NEGATIVE mg/dL
Specific Gravity, Urine: 1.025 (ref 1.005–1.030)
Urobilinogen, UA: 0.2 mg/dL (ref 0.0–1.0)
pH: 5 (ref 5.0–8.0)

## 2021-12-08 LAB — CBC
HCT: 34 % — ABNORMAL LOW (ref 36.0–46.0)
Hemoglobin: 10.8 g/dL — ABNORMAL LOW (ref 12.0–15.0)
MCH: 29.8 pg (ref 26.0–34.0)
MCHC: 31.8 g/dL (ref 30.0–36.0)
MCV: 93.9 fL (ref 80.0–100.0)
Platelets: 196 10*3/uL (ref 150–400)
RBC: 3.62 MIL/uL — ABNORMAL LOW (ref 3.87–5.11)
RDW: 13.8 % (ref 11.5–15.5)
WBC: 5.7 10*3/uL (ref 4.0–10.5)
nRBC: 0 % (ref 0.0–0.2)

## 2021-12-08 LAB — CBG MONITORING, ED: Glucose-Capillary: 121 mg/dL — ABNORMAL HIGH (ref 70–99)

## 2021-12-08 MED ORDER — FUROSEMIDE 40 MG PO TABS: 40.0000 mg | ORAL_TABLET | Freq: Every day | ORAL | 0 refills | Status: DC

## 2021-12-08 NOTE — Discharge Instructions (Addendum)
Swelling happens when fluid collects in small spaces around tissues and organs inside the body. Another word for swelling is "edema." Some common parts of the body where people can have swelling are the lower legs or hands. This typically is worse in the areas of the body that are closest to the ground (because of gravity)  Symptoms of swelling can include puffiness of the skin, which can cause the skin to look stretched and shiny. This often occurs with swelling in the lower legs and can be worse after you sit or stand for a long time.  Treatment of edema includes several components: treatment of the underlying cause (if possible), reducing the amount of salt (sodium) in your diet, and, in many cases, use of a medication called a diuretic to eliminate excess fluid. Using compression stockings and elevating the legs may also be recommended.   You have had labs (blood work) drawn today. We will call you with any significant abnormalities or if there is need to begin or change treatment or pursue further follow up.  You may also review your test results online through MyChart. If you do not have a MyChart account, instructions to sign up should be on your discharge paperwork.  

## 2021-12-08 NOTE — ED Triage Notes (Signed)
Had a lumpectomy 2 weeks prior, two days after surgery began having bilateral lower leg swelling that has worsened into today. Denies CP, SOB, cough, palpitations, fever, N/V/D, abdominal pain, denies pain in calves/behind knees, numbness/tingling in lower extremities. Pitting edema visible bilaterally without redness, reports tenderness at sites and pain with walking. Has been keeping legs elevated without relief.

## 2021-12-10 ENCOUNTER — Encounter: Payer: Self-pay | Admitting: *Deleted

## 2021-12-10 NOTE — ED Provider Notes (Signed)
[place]   [id number] 12/08/21 Arrival Time: 1009  ASSESSMENT & PLAN:  1. Bilateral lower extremity edema    Unclear etiology. Recommend compression stockings. As needed: Discharge Medication List as of 12/08/2021 11:43 AM     START taking these medications   Details  furosemide (LASIX) 40 MG tablet Take 1 tablet (40 mg total) by mouth daily., Starting Sat 12/08/2021, Normal       No signs of DVT or overlying skin infection. Pending: CMP/CBC. Elevate legs.  Recommend:  Follow-up Information     Schedule an appointment as soon as possible for a visit  with Burnard Bunting, MD.   Specialty: Internal Medicine Contact information: [address] [place] [address] [phone]                 Reviewed expectations re: course of current medical issues. Questions answered. Outlined signs and symptoms indicating need for more acute intervention. Understanding verbalized. After Visit Summary given.   SUBJECTIVE: History from: Patient. Theresa Shannon is a 76 y.o. female. Reports: bilateral LE edema; on/off over past couple of weeks. Denies CP, SOB, cough, palpitations, fever, N/V/D, abdominal pain; denies pain in calves/behind knees; denies numbness/tingling in lower extremities. Legs are painful when edema present. Has been keeping legs elevated without relief. No new medications reported.  OBJECTIVE:  Vitals:   12/08/21 1044  BP: (!) 152/60  Pulse: (!) 56  Resp: 16  Temp: 97.9 F (36.6 C)  TempSrc: Oral  SpO2: 96%    BP and pulse noted; pulse around her baseline.  General appearance: alert; no distress Eyes: PERRLA; EOMI; conjunctiva normal HENT: [place]; AT; without nasal congestion Neck: supple  CV: reg Lungs: speaks full sentences without difficulty; unlabored Extremities:1+ pitting edema bilaterally Skin: warm and dry Neurologic: normal gait Psychological: alert and cooperative; normal mood and affect   Allergies  Allergen  Reactions   Amlodipine Swelling and Rash   Oxycodone-Acetaminophen Other (See Comments)    REACTION: Syncope   Other Rash    Band-aid adhesive    Past Medical History:  Diagnosis Date   Anemia    Atypical chest pain    stress test 1/12-normal   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   Chronic kidney disease    hypertensive chronic kideny disease benign/ stage 3   Diabetes mellitus, type 2 (HCC)    Esophagitis determined by endoscopy 03/2015   GERD (gastroesophageal reflux disease)    Gout 04/2015   L toe   History of frequent urinary tract infections    History of hiatal hernia    History of kidney stones    HTN (hypertension)    Hyperlipemia    Hypertension    Hypothyroidism    Peripheral neuropathy    Peripheral vascular disease (HCC)    Spondylolisthesis of lumbar region    Stroke ([place])    in the eye only; right eye    Thyroid goiter    multinodular    Wears glasses    Social History   Socioeconomic History   Marital status: Married    Spouse name: Elenore Rota   Number of children: 1   Years of education: 12th   Highest education level: Not on file  Occupational History   Occupation: Retired  Tobacco Use   Smoking status: Never   Smokeless tobacco: Never  Vaping Use   Vaping Use: Never used  Substance and Sexual Activity   Alcohol use: No   Drug use:  No   Sexual activity: Not on file  Other Topics Concern   Not on file  Social History Narrative   Patient lives at home with husband Elenore Rota.    Patient has 1 child.    Patient is retired.    Patient has a high school education.    Social Determinants of Health   Financial Resource Strain: Low Risk    Difficulty of Paying Living Expenses: Not very hard  Food Insecurity: No Food Insecurity   Worried About Charity fundraiser in the Last Year: Never true   Ran Out of Food in the Last Year: Never true  Transportation Needs: No Transportation Needs   Lack of Transportation (Medical): No   Lack of  Transportation (Non-Medical): No  Physical Activity: Not on file  Stress: Not on file  Social Connections: Not on file  Intimate Partner Violence: Not on file   Family History  Problem Relation Age of Onset   Dementia Mother    Kidney disease Mother    Cancer Mother    Diabetes Mother    Breast cancer Mother 55   Dementia Father    Heart disease Father    Hypertension Father    Stroke Father    Breast cancer Sister 56   Kidney cancer Sister 52   Leukemia Maternal Aunt    Pancreatic cancer Paternal Aunt    Past Surgical History:  Procedure Laterality Date   ABDOMINAL HYSTERECTOMY     ABSCESS DRAINAGE  07/08/1993   breast   ANTERIOR LAT LUMBAR FUSION N/A 02/02/2018   Procedure: Anterolateral decompression Lumbar Four-Five with xlif percutaneous fixation with robotic placement of screws Lumbar four-five;  Surgeon: Kristeen Miss, MD;  Location: [place];  Service: Neurosurgery;  Laterality: N/A;   APPENDECTOMY     APPLICATION OF ROBOTIC ASSISTANCE FOR SPINAL PROCEDURE N/A 02/02/2018   Procedure: APPLICATION OF ROBOTIC ASSISTANCE FOR SPINAL PROCEDURE;  Surgeon: Kristeen Miss, MD;  Location: [place];  Service: Neurosurgery;  Laterality: N/A;   BACK SURGERY     BREAST BIOPSY     BREAST CYST EXCISION  02/26/2012   Procedure: CYST EXCISION BREAST;  Surgeon: Harl Bowie, MD;  Location: [place];  Service: General;  Laterality: Left;  excision chronic cyst left breast   BREAST LUMPECTOMY WITH RADIOACTIVE SEED LOCALIZATION Right 11/22/2021   Procedure: RIGHT BREAST LUMPECTOMY WITH RADIOACTIVE SEED LOCALIZATION;  Surgeon: Rolm Bookbinder, MD;  Location: [place];  Service: General;  Laterality: Right;   BUNIONECTOMY     LEFT   CHOLECYSTECTOMY N/A 08/17/2015   Procedure: LAPAROSCOPIC CHOLECYSTECTOMY WITH INTRAOPERATIVE CHOLANGIOGRAM;  Surgeon: Armandina Gemma, MD;  Location: [facility];  Service: General;  Laterality: N/A;   DILATION AND CURETTAGE OF UTERUS     KNEE SURGERY      BILATERAL   LUMBAR PERCUTANEOUS PEDICLE SCREW 1 LEVEL N/A 02/02/2018   Procedure: LUMBAR PERCUTANEOUS PEDICLE SCREW LUMBAR FOUR-FIVE;  Surgeon: Kristeen Miss, MD;  Location: [place];  Service: Neurosurgery;  Laterality: N/A;   TEMPORAL ARTERY BIOPSY / LIGATION     TONSILLECTOMY AND ADENOIDECTOMY     TUBAL LIGATION     TUMOR REMOVAL     LEFT ARM-lipoma     Vanessa Kick, MD 12/10/21 1135

## 2021-12-11 ENCOUNTER — Other Ambulatory Visit: Payer: Self-pay | Admitting: *Deleted

## 2021-12-12 ENCOUNTER — Inpatient Hospital Stay: Payer: Federal, State, Local not specified - PPO | Admitting: Hematology and Oncology

## 2021-12-12 ENCOUNTER — Other Ambulatory Visit: Payer: Self-pay | Admitting: General Surgery

## 2021-12-12 ENCOUNTER — Encounter: Payer: Self-pay | Admitting: *Deleted

## 2021-12-12 ENCOUNTER — Inpatient Hospital Stay: Payer: Federal, State, Local not specified - PPO

## 2021-12-13 ENCOUNTER — Encounter: Payer: Self-pay | Admitting: *Deleted

## 2021-12-13 ENCOUNTER — Encounter: Payer: Self-pay | Admitting: Genetic Counselor

## 2021-12-13 ENCOUNTER — Encounter (HOSPITAL_COMMUNITY): Payer: Self-pay

## 2021-12-14 ENCOUNTER — Telehealth: Payer: Self-pay | Admitting: Hematology and Oncology

## 2021-12-14 ENCOUNTER — Ambulatory Visit: Payer: Self-pay | Admitting: Genetic Counselor

## 2021-12-14 DIAGNOSIS — Z1379 Encounter for other screening for genetic and chromosomal anomalies: Secondary | ICD-10-CM

## 2021-12-14 NOTE — Progress Notes (Signed)
HPI:   Ms. Rone was previously seen in the [facility] clinic due to a personal and family history of cancer and concerns regarding a hereditary predisposition to cancer. Please refer to our prior cancer genetics clinic note for more information regarding our discussion, assessment and recommendations, at the time. Ms. Spidle recent genetic test results were disclosed to her, as were recommendations warranted by these results. These results and recommendations are discussed in more detail below.  CANCER HISTORY:  Oncology History  Ductal carcinoma in situ (DCIS) of right breast  10/15/2021 Mammogram   Digital screening bilateral mammogram showed indeterminate calcifications in the right breast.   10/31/2021 Pathology Results   Pathology from the breast biopsy showed low-grade DCIS with calcifications rest of the needle core biopsy showed fibrocystic changes with usual ductal hyperplasia and vascular calcifications ER 100% positive strong staining, PR 95% positive strong staining   11/12/2021 Initial Diagnosis   Ductal carcinoma in situ (DCIS) of right breast   11/14/2021 Cancer Staging   Staging form: Breast, AJCC 8th Edition - Clinical stage from 11/14/2021: Stage 0 (cTis (DCIS), cN0, cM0, ER+, PR+, HER2: Not Assessed) - Signed by Benay Pike, MD on 11/14/2021 Stage prefix: Initial diagnosis Nuclear grade: G1 Laterality: Right Staged by: Pathologist and managing physician Stage used in treatment planning: Yes National guidelines used in treatment planning: Yes Type of national guideline used in treatment planning: NCCN    Genetic Testing   [facility] CustomNext Panel was Negative. Report date is 12/05/2021.  The CustomNext-Cancer+RNAinsight panel offered by Althia Forts includes sequencing and rearrangement analysis for the following 52 genes :  APC, ATM, AXIN2, BAP1, BARD1, BMPR1A, BRCA1, BRCA2, BRIP1, CDH1, CDK4, CDKN2A, CHEK2, CTNNA1, DICER1, EPCAM, FH, FLCN, GREM1,  HOXB13, KIT, MEN1, MET, MITF, MLH1, MSH2, MSH3, MSH6, MUTYH, NBN, NF1, NTHL1, PALB2, PDGFRA, PMS2, POLD1, POLE, PTEN, RAD50, RAD51C, RAD51D, SDHA, SDHB, SDHC, SDHD, SMAD4, SMARCA4, STK11, TP53, TSC1, TSC2, and VHL.  RNA data is routinely analyzed for use in variant interpretation for all genes.      FAMILY HISTORY:  We obtained a detailed, 4-generation family history.  Significant diagnoses are listed below:      Family History  Problem Relation Age of Onset   Breast cancer Mother 84   Breast cancer Sister 72   Kidney cancer Sister 73   Leukemia Maternal Aunt     Pancreatic cancer Paternal 74             Ms. Rueckert's sister was diagnosed with breast cancer at age 34 and a second sister was diagnosed with renal cell carcinoma at age 71. Her mother was diagnosed with breast cancer at age 13, she died at age 55. Her maternal aunt was diagnosed with leukemia and is deceased. Her maternal grandfather was diagnosed with an unknown type of brain tumor, he is deceased. Ms. Leppert paternal aunt was diagnosed with pancreatic cancer at an unknown age, she is deceased. Ms. Begin is unaware of previous family history of genetic testing for hereditary cancer risks. There is no reported Ashkenazi Jewish ancestry.   GENETIC TEST RESULTS:  The [facility] CustomNext Panel found no pathogenic mutations.   The CustomNext-Cancer+RNAinsight panel offered by Althia Forts includes sequencing and rearrangement analysis for the following 47 genes:  APC, ATM, AXIN2, BARD1, BMPR1A, BRCA1, BRCA2, BRIP1, CDH1, CDK4, CDKN2A, CHEK2, CTNNA1, DICER1, EPCAM, GREM1, HOXB13, KIT, MEN1, MLH1, MSH2, MSH3, MSH6, MUTYH, NBN, NF1, NTHL1, PALB2, PDGFRA, PMS2, POLD1, POLE, PTEN, RAD50, RAD51C, RAD51D, SDHA, SDHB, SDHC, SDHD,  SMAD4, SMARCA4, STK11, TP53, TSC1, TSC2, and VHL.  RNA data is routinely analyzed for use in variant interpretation for all genes.  The test report has been scanned into EPIC and is located under the  Molecular Pathology section of the Results Review tab.  A portion of the result report is included below for reference. Genetic testing reported out on 12/05/2021.        Even though a pathogenic variant was not identified, possible explanations for the cancer in the family may include: There may be no hereditary risk for cancer in the family. The cancers in Ms. Rosch and/or her family may be due to other genetic or environmental factors. There may be a gene mutation in one of these genes that current testing methods cannot detect, but that chance is small. There could be another gene that has not yet been discovered, or that we have not yet tested, that is responsible for the cancer diagnoses in the family.  It is also possible there is a hereditary cause for the cancer in the family that Ms. Miers did not inherit.  Therefore, it is important to remain in touch with cancer genetics in the future so that we can continue to offer Ms. Carrigg the most up to date genetic testing.   ADDITIONAL GENETIC TESTING:  We discussed with Ms. Behrens that her genetic testing was fairly extensive.  If there are genes identified to increase cancer risk that can be analyzed in the future, we would be happy to discuss and coordinate this testing at that time.    CANCER SCREENING RECOMMENDATIONS:  Ms. Fadness test result is considered negative (normal).  This means that we have not identified a hereditary cause for her personal and family history of cancer at this time.   An individual's cancer risk and medical management are not determined by genetic test results alone. Overall cancer risk assessment incorporates additional factors, including personal medical history, family history, and any available genetic information that may result in a personalized plan for cancer prevention and surveillance. Therefore, it is recommended she continue to follow the cancer management and screening guidelines  provided by her oncology and primary healthcare provider.  RECOMMENDATIONS FOR FAMILY MEMBERS:   Since she did not inherit a mutation in a cancer predisposition gene included on this panel, her daughter could not have inherited a mutation from her in one of these genes. Individuals in this family might be at some increased risk of developing cancer, over the general population risk, due to the family history of cancer. We recommend women in this family have a yearly mammogram beginning at age 36, or 67 years younger than the earliest onset of cancer, an annual clinical breast exam, and perform monthly breast self-exams.  Other members of the family may still carry a pathogenic variant in one of these genes that Ms. Lusher did not inherit. Based on the family history, we recommend her sister, who was diagnosed with breast cancer have genetic counseling and testing.   FOLLOW-UP:  Cancer genetics is a rapidly advancing field and it is possible that new genetic tests will be appropriate for her and/or her family members in the future. We encouraged her to remain in contact with cancer genetics on an annual basis so we can update her personal and family histories and let her know of advances in cancer genetics that may benefit this family.   Our contact number was provided. Ms. Ruscitti questions were answered to her satisfaction, and she knows  she is welcome to call us at anytime with additional questions or concerns.   Lucille Passy, MS, [facility] Genetic Counselor [email or web address] [phone]) [phone]

## 2021-12-14 NOTE — Telephone Encounter (Signed)
.  Called pt per 6/9 inbasket , Patient was unavailable, a message with appt time and date was left with number on file.

## 2021-12-18 NOTE — Patient Instructions (Signed)
DUE TO COVID-19 ONLY TWO VISITORS  (aged 78 and older)  ARE ALLOWED TO COME WITH YOU AND STAY IN THE WAITING ROOM ONLY DURING PRE OP AND PROCEDURE.   **NO VISITORS ARE ALLOWED IN THE SHORT STAY AREA OR RECOVERY ROOM!!**  IF YOU WILL BE ADMITTED INTO THE HOSPITAL YOU ARE ALLOWED ONLY FOUR SUPPORT PEOPLE DURING VISITATION HOURS ONLY (7 AM -8PM)   The support person(s) must pass our screening, gel in and out, and wear a mask at all times, including in the patient's room. Patients must also wear a mask when staff or their support person are in the room. Visitors GUEST BADGE MUST BE WORN VISIBLY  One adult visitor may remain with you overnight and MUST be in the room by 8 P.M.     Your procedure is scheduled on: 12/28/21   Report to [facility] [address] Main Entrance    Report to admitting at : 6:45 AM   Call this number if you have problems the morning of surgery [phone]   Do not eat food :After Midnight.   After Midnight you may have the following liquids until: 6:00 AM DAY OF SURGERY  Water Black Coffee (sugar ok, NO MILK/CREAM OR CREAMERS)  Tea (sugar ok, NO MILK/CREAM OR CREAMERS) regular and decaf                             Plain Jell-O (NO RED)                                           Fruit ices (not with fruit pulp, NO RED)                                     Popsicles (NO RED)                                                                  Juice: apple, WHITE grape, WHITE cranberry Sports drinks like Gatorade (NO RED) Clear broth(vegetable,chicken,beef)   Drink  G2 drink AT : 6:00 AM the day of surgery.      The day of surgery:  Drink ONE (1) Pre-Surgery Clear Ensure or G2 at AM the morning of surgery. Drink in one sitting. Do not sip.  This drink was given to you during your hospital  pre-op appointment visit. Nothing else to drink after completing the  Pre-Surgery Clear Ensure or G2.          If you have questions, please contact your surgeon's office.   Oral  Hygiene is also important to reduce your risk of infection.                                    Remember - BRUSH YOUR TEETH THE MORNING OF SURGERY WITH YOUR REGULAR TOOTHPASTE   Do NOT smoke after Midnight   Take these medicines the morning of surgery with A SIP OF WATER: atenolol,synthroid.  How to Manage Your  Diabetes Before and After Surgery  Why is it important to control my blood sugar before and after surgery? Improving blood sugar levels before and after surgery helps healing and can limit problems. A way of improving blood sugar control is eating a healthy diet by:  Eating less sugar and carbohydrates  Increasing activity/exercise  Talking with your doctor about reaching your blood sugar goals High blood sugars (greater than 180 mg/dL) can raise your risk of infections and slow your recovery, so you will need to focus on controlling your diabetes during the weeks before surgery. Make sure that the doctor who takes care of your diabetes knows about your planned surgery including the date and location.  How do I manage my blood sugar before surgery? Check your blood sugar at least 4 times a day, starting 2 days before surgery, to make sure that the level is not too high or low. Check your blood sugar the morning of your surgery when you wake up and every 2 hours until you get to the Short Stay unit. If your blood sugar is less than 70 mg/dL, you will need to treat for low blood sugar: Do not take insulin. Treat a low blood sugar (less than 70 mg/dL) with  cup of clear juice (cranberry or apple), 4 glucose tablets, OR glucose gel. Recheck blood sugar in 15 minutes after treatment (to make sure it is greater than 70 mg/dL). If your blood sugar is not greater than 70 mg/dL on recheck, call [phone] for further instructions. Report your blood sugar to the short stay nurse when you get to Short Stay.  If you are admitted to the hospital after surgery: Your blood sugar will be  checked by the staff and you will probably be given insulin after surgery (instead of oral diabetes medicines) to make sure you have good blood sugar levels. The goal for blood sugar control after surgery is 80-180 mg/dL.   WHAT DO I DO ABOUT MY DIABETES MEDICATION?  Do not take oral diabetes medicines (pills) the morning of surgery.  THE DAY BEFORE SURGERY, take metformin as usual.       THE MORNING OF SURGERY, DO NOT TAKE ANY ORAL DIABETIC MEDICATIONS DAY OF YOUR SURGERY  Bring CPAP mask and tubing day of surgery.                              You may not have any metal on your body including hair pins, jewelry, and body piercing             Do not wear make-up, lotions, powders, perfumes/cologne, or deodorant  Do not wear nail polish including gel and S&S, artificial/acrylic nails, or any other type of covering on natural nails including finger and toenails. If you have artificial nails, gel coating, etc. that needs to be removed by a nail salon please have this removed prior to surgery or surgery may need to be canceled/ delayed if the surgeon/ anesthesia feels like they are unable to be safely monitored.   Do not shave  48 hours prior to surgery.   Do not bring valuables to the hospital. [place].   Contacts, dentures or bridgework may not be worn into surgery.   Bring small overnight bag day of surgery.   DO NOT [place].  PHARMACY WILL DISPENSE MEDICATIONS LISTED ON YOUR MEDICATION LIST TO YOU DURING YOUR ADMISSION [place]!    Patients discharged on the day of surgery will not be allowed to drive home.  Someone NEEDS to stay with you for the first 24 hours after anesthesia.   Special Instructions: Bring a copy of your healthcare power of attorney and living will documents         the day of surgery if you haven't scanned them before.              Please read over the following fact  sheets you were given: IF YOU HAVE QUESTIONS ABOUT YOUR PRE-OP INSTRUCTIONS PLEASE CALL [phone]     [facility] - Preparing for Surgery Before surgery, you can play an important role.  Because skin is not sterile, your skin needs to be as free of germs as possible.  You can reduce the number of germs on your skin by washing with CHG (chlorahexidine gluconate) soap before surgery.  CHG is an antiseptic cleaner which kills germs and bonds with the skin to continue killing germs even after washing. Please DO NOT use if you have an allergy to CHG or antibacterial soaps.  If your skin becomes reddened/irritated stop using the CHG and inform your nurse when you arrive at Short Stay. Do not shave (including legs and underarms) for at least 48 hours prior to the first CHG shower.  You may shave your face/neck. Please follow these instructions carefully:  1.  Shower with CHG Soap the night before surgery and the  morning of Surgery.  2.  If you choose to wash your hair, wash your hair first as usual with your  normal  shampoo.  3.  After you shampoo, rinse your hair and body thoroughly to remove the  shampoo.                           4.  Use CHG as you would any other liquid soap.  You can apply chg directly  to the skin and wash                       Gently with a scrungie or clean washcloth.  5.  Apply the CHG Soap to your body ONLY FROM THE NECK DOWN.   Do not use on face/ open                           Wound or open sores. Avoid contact with eyes, ears mouth and genitals (private parts).                       Wash face,  Genitals (private parts) with your normal soap.             6.  Wash thoroughly, paying special attention to the area where your surgery  will be performed.  7.  Thoroughly rinse your body with warm water from the neck down.  8.  DO NOT shower/wash with your normal soap after using and rinsing off  the CHG Soap.                9.  Pat yourself dry with a clean towel.             10.  Wear clean pajamas.  11.  Place clean sheets on your bed the night of your first shower and do not  sleep with pets. Day of Surgery : Do not apply any lotions/deodorants the morning of surgery.  Please wear clean clothes to the hospital/surgery center.  FAILURE TO FOLLOW THESE INSTRUCTIONS MAY RESULT IN THE CANCELLATION OF YOUR SURGERY PATIENT SIGNATURE_________________________________  NURSE SIGNATURE__________________________________  ________________________________________________________________________

## 2021-12-19 ENCOUNTER — Ambulatory Visit (HOSPITAL_COMMUNITY)
Admission: RE | Admit: 2021-12-19 | Discharge: 2021-12-19 | Disposition: A | Discharge status: Home / Self Care | Payer: Federal, State, Local not specified - PPO | Source: Ambulatory Visit | Attending: General Surgery | Admitting: General Surgery

## 2021-12-19 ENCOUNTER — Other Ambulatory Visit (HOSPITAL_COMMUNITY): Payer: Self-pay | Admitting: Internal Medicine

## 2021-12-19 ENCOUNTER — Encounter (HOSPITAL_COMMUNITY)
Admission: RE | Admit: 2021-12-19 | Discharge: 2021-12-19 | Disposition: A | Discharge status: Home / Self Care | Payer: Federal, State, Local not specified - PPO | Source: Ambulatory Visit | Attending: General Surgery | Admitting: General Surgery

## 2021-12-19 ENCOUNTER — Encounter (HOSPITAL_COMMUNITY): Payer: Self-pay

## 2021-12-19 ENCOUNTER — Other Ambulatory Visit: Payer: Self-pay

## 2021-12-19 VITALS — BP 157/61 | HR 56 | Temp 97.6°F | Ht 63.0 in | Wt 170.0 lb

## 2021-12-19 DIAGNOSIS — R52 Pain, unspecified: Secondary | ICD-10-CM

## 2021-12-19 DIAGNOSIS — E119 Type 2 diabetes mellitus without complications: Secondary | ICD-10-CM

## 2021-12-19 DIAGNOSIS — I10 Essential (primary) hypertension: Secondary | ICD-10-CM | POA: Insufficient documentation

## 2021-12-19 LAB — CBC
HCT: 34.9 % — ABNORMAL LOW (ref 36.0–46.0)
Hemoglobin: 10.6 g/dL — ABNORMAL LOW (ref 12.0–15.0)
MCH: 28.9 pg (ref 26.0–34.0)
MCHC: 30.4 g/dL (ref 30.0–36.0)
MCV: 95.1 fL (ref 80.0–100.0)
Platelets: 199 10*3/uL (ref 150–400)
RBC: 3.67 MIL/uL — ABNORMAL LOW (ref 3.87–5.11)
RDW: 13.8 % (ref 11.5–15.5)
WBC: 5 10*3/uL (ref 4.0–10.5)
nRBC: 0 % (ref 0.0–0.2)

## 2021-12-19 LAB — BASIC METABOLIC PANEL
Anion gap: 10 (ref 5–15)
BUN: 57 mg/dL — ABNORMAL HIGH (ref 8–23)
CO2: 19 mmol/L — ABNORMAL LOW (ref 22–32)
Calcium: 9.9 mg/dL (ref 8.9–10.3)
Chloride: 111 mmol/L (ref 98–111)
Creatinine, Ser: 1.76 mg/dL — ABNORMAL HIGH (ref 0.44–1.00)
GFR, Estimated: 29 mL/min — ABNORMAL LOW (ref >60)
Glucose, Bld: 151 mg/dL — ABNORMAL HIGH (ref 70–99)
Potassium: 5.1 mmol/L (ref 3.5–5.1)
Sodium: 140 mmol/L (ref 135–145)

## 2021-12-19 LAB — GLUCOSE, CAPILLARY: Glucose-Capillary: 161 mg/dL — ABNORMAL HIGH (ref 70–99)

## 2021-12-19 NOTE — Progress Notes (Signed)
For Short Stay: [place] appointment date: Date of COVID positive in last 77 days:  Bowel Prep reminder:   For Anesthesia: PCP - Dr. Burnard Bunting  Cardiologist - N/A  Chest x-ray -  EKG - 11/20/21 Stress Test -  ECHO - 08/26/06 Cardiac Cath -  Pacemaker/ICD device last checked: Pacemaker orders received: Device Rep notified:  Spinal Cord Stimulator:  Sleep Study -  CPAP -   Fasting Blood Sugar - 130's Checks Blood Sugar __1___ times a day Date and result of last Hgb A1c- 7.4: 11/20/21  Blood Thinner Instructions: Aspirin Instructions: It's on hold Last Dose:  Activity level: Can go up a flight of stairs and activities of daily living without stopping and without chest pain and/or shortness of breath   Able to exercise without chest pain and/or shortness of breath   Unable to go up a flight of stairs without chest pain and/or shortness of breath     Anesthesia review: Hx: DIA,HTN,CKD(III),Stroke  Patient denies shortness of breath, fever, cough and chest pain at PAT appointment   Patient verbalized understanding of instructions that were given to them at the PAT appointment. Patient was also instructed that they will need to review over the PAT instructions again at home before surgery.

## 2021-12-19 NOTE — Progress Notes (Signed)
Bilateral LE venous duplex study completed. Please see CV Proc for preliminary results.  Mychaela Lennartz BS, RVT 12/19/2021 1:23 PM

## 2021-12-19 NOTE — Progress Notes (Signed)
Lab. Report: Creatinine: 1.76 Hemoglobin: 10.6

## 2021-12-24 ENCOUNTER — Encounter: Payer: Self-pay | Admitting: *Deleted

## 2021-12-27 ENCOUNTER — Encounter (HOSPITAL_COMMUNITY): Payer: Self-pay | Admitting: General Surgery

## 2021-12-27 NOTE — Anesthesia Preprocedure Evaluation (Signed)
Anesthesia Evaluation    Reviewed: Allergy & Precautions, Patient's Chart, lab work & pertinent test results  Airway        Dental   Pulmonary           Cardiovascular hypertension, + Peripheral Vascular Disease       Neuro/Psych  Headaches, CVA    GI/Hepatic GERD  ,  Endo/Other  diabetes, Oral Hypoglycemic Agents  Renal/GU CRFRenal disease     Musculoskeletal   Abdominal   Peds  Hematology  (+) Blood dyscrasia, anemia , Lab Results      Component                Value               Date                      WBC                      5.0                 12/19/2021                HGB                      10.6 (L)            12/19/2021                HCT                      34.9 (L)            12/19/2021                MCV                      95.1                12/19/2021                PLT                      199                 12/19/2021              Anesthesia Other Findings R Breast CA  ALL: Amlodipine oxycodone  Reproductive/Obstetrics                             Anesthesia Physical Anesthesia Plan  ASA: 3  Anesthesia Plan: General   Post-op Pain Management: Toradol IV (intra-op)* and Tylenol PO (pre-op)*   Induction: Intravenous  PONV Risk Score and Plan: 4 or greater and Treatment may vary due to age or medical condition, Ondansetron, Midazolam and Dexamethasone  Airway Management Planned:   Additional Equipment:   Intra-op Plan:   Post-operative Plan:   Informed Consent:   Plan Discussed with:   Anesthesia Plan Comments:         Anesthesia Quick Evaluation

## 2021-12-28 ENCOUNTER — Encounter (HOSPITAL_COMMUNITY): Payer: Self-pay | Admitting: General Surgery

## 2021-12-28 ENCOUNTER — Ambulatory Visit (HOSPITAL_COMMUNITY): Payer: Federal, State, Local not specified - PPO | Admitting: Physician Assistant

## 2021-12-28 ENCOUNTER — Other Ambulatory Visit: Payer: Self-pay

## 2021-12-28 ENCOUNTER — Ambulatory Visit (HOSPITAL_COMMUNITY)
Admission: RE | Admit: 2021-12-28 | Discharge: 2021-12-28 | Disposition: A | Discharge status: Home / Self Care | Payer: Federal, State, Local not specified - PPO | Source: Ambulatory Visit | Attending: General Surgery | Admitting: General Surgery

## 2021-12-28 ENCOUNTER — Ambulatory Visit (HOSPITAL_COMMUNITY): Payer: Federal, State, Local not specified - PPO | Admitting: Anesthesiology

## 2021-12-28 ENCOUNTER — Encounter (HOSPITAL_COMMUNITY)
Admission: RE | Disposition: A | Discharge status: Home / Self Care | Payer: Self-pay | Source: Ambulatory Visit | Attending: General Surgery

## 2021-12-28 DIAGNOSIS — I69398 Other sequelae of cerebral infarction: Secondary | ICD-10-CM | POA: Insufficient documentation

## 2021-12-28 DIAGNOSIS — D0511 Intraductal carcinoma in situ of right breast: Secondary | ICD-10-CM | POA: Diagnosis present

## 2021-12-28 DIAGNOSIS — E1122 Type 2 diabetes mellitus with diabetic chronic kidney disease: Secondary | ICD-10-CM | POA: Diagnosis not present

## 2021-12-28 DIAGNOSIS — E1151 Type 2 diabetes mellitus with diabetic peripheral angiopathy without gangrene: Secondary | ICD-10-CM | POA: Diagnosis not present

## 2021-12-28 DIAGNOSIS — Z7984 Long term (current) use of oral hypoglycemic drugs: Secondary | ICD-10-CM | POA: Diagnosis not present

## 2021-12-28 DIAGNOSIS — Z170 Estrogen receptor positive status [ER+]: Secondary | ICD-10-CM | POA: Diagnosis not present

## 2021-12-28 DIAGNOSIS — H5461 Unqualified visual loss, right eye, normal vision left eye: Secondary | ICD-10-CM | POA: Diagnosis not present

## 2021-12-28 DIAGNOSIS — I129 Hypertensive chronic kidney disease with stage 1 through stage 4 chronic kidney disease, or unspecified chronic kidney disease: Secondary | ICD-10-CM | POA: Insufficient documentation

## 2021-12-28 DIAGNOSIS — N6031 Fibrosclerosis of right breast: Secondary | ICD-10-CM | POA: Diagnosis not present

## 2021-12-28 DIAGNOSIS — N189 Chronic kidney disease, unspecified: Secondary | ICD-10-CM | POA: Diagnosis not present

## 2021-12-28 HISTORY — PX: RE-EXCISION OF BREAST LUMPECTOMY: SHX6048

## 2021-12-28 LAB — GLUCOSE, CAPILLARY
Glucose-Capillary: 164 mg/dL — ABNORMAL HIGH (ref 70–99)
Glucose-Capillary: 162 mg/dL — ABNORMAL HIGH (ref 70–99)

## 2021-12-28 SURGERY — EXCISION, LESION, BREAST
Anesthesia: General | Site: Breast | Laterality: Right

## 2021-12-28 MED ORDER — MIDAZOLAM HCL 2 MG/2ML IJ SOLN
INTRAMUSCULAR | Status: AC
  Filled 2021-12-28: qty 2

## 2021-12-28 MED ORDER — CEFAZOLIN SODIUM-DEXTROSE 2-4 GM/100ML-% IV SOLN
2.0000 g | INTRAVENOUS | Status: AC
  Administered 2021-12-28: 2 g via INTRAVENOUS

## 2021-12-28 MED ORDER — ACETAMINOPHEN 500 MG PO TABS
1000.0000 mg | ORAL_TABLET | ORAL | Status: AC
  Administered 2021-12-28: 1000 mg via ORAL
  Filled 2021-12-28: qty 2

## 2021-12-28 MED ORDER — DEXMEDETOMIDINE (PRECEDEX) IN NS 20 MCG/5ML (4 MCG/ML) IV SYRINGE
PREFILLED_SYRINGE | INTRAVENOUS | Status: DC | PRN
  Administered 2021-12-28: 8 ug via INTRAVENOUS

## 2021-12-28 MED ORDER — 0.9 % SODIUM CHLORIDE (POUR BTL) OPTIME
TOPICAL | Status: DC | PRN
  Administered 2021-12-28: 1000 mL

## 2021-12-28 MED ORDER — MIDAZOLAM HCL 5 MG/5ML IJ SOLN
INTRAMUSCULAR | Status: DC | PRN
  Administered 2021-12-28: 1 mg via INTRAVENOUS

## 2021-12-28 MED ORDER — ENSURE PRE-SURGERY PO LIQD
296.0000 mL | Freq: Once | ORAL | Status: DC
  Filled 2021-12-28: qty 296

## 2021-12-28 MED ORDER — BUPIVACAINE-EPINEPHRINE (PF) 0.25% -1:200000 IJ SOLN
INTRAMUSCULAR | Status: AC
  Filled 2021-12-28: qty 30

## 2021-12-28 MED ORDER — PROPOFOL 10 MG/ML IV BOLUS
INTRAVENOUS | Status: DC | PRN
  Administered 2021-12-28: 120 mg via INTRAVENOUS

## 2021-12-28 MED ORDER — FENTANYL CITRATE (PF) 100 MCG/2ML IJ SOLN
INTRAMUSCULAR | Status: DC | PRN
  Administered 2021-12-28 (×2): 25 ug via INTRAVENOUS
  Administered 2021-12-28: 50 ug via INTRAVENOUS

## 2021-12-28 MED ORDER — LACTATED RINGERS IV SOLN: INTRAVENOUS | Status: DC

## 2021-12-28 MED ORDER — FENTANYL CITRATE PF 50 MCG/ML IJ SOSY: 25.0000 ug | PREFILLED_SYRINGE | INTRAMUSCULAR | Status: DC | PRN

## 2021-12-28 MED ORDER — LIDOCAINE HCL (CARDIAC) PF 100 MG/5ML IV SOSY
PREFILLED_SYRINGE | INTRAVENOUS | Status: DC | PRN
  Administered 2021-12-28: 80 mg via INTRAVENOUS

## 2021-12-28 MED ORDER — ONDANSETRON HCL 4 MG/2ML IJ SOLN
INTRAMUSCULAR | Status: DC | PRN
  Administered 2021-12-28: 4 mg via INTRAVENOUS

## 2021-12-28 MED ORDER — ACETAMINOPHEN 10 MG/ML IV SOLN: 1000.0000 mg | Freq: Once | INTRAVENOUS | Status: DC | PRN

## 2021-12-28 MED ORDER — ORAL CARE MOUTH RINSE
15.0000 mL | Freq: Once | OROMUCOSAL | Status: AC
  Administered 2021-12-28: 15 mL via OROMUCOSAL

## 2021-12-28 MED ORDER — GLYCOPYRROLATE 0.2 MG/ML IJ SOLN
INTRAMUSCULAR | Status: DC | PRN
  Administered 2021-12-28: .2 mg via INTRAVENOUS

## 2021-12-28 MED ORDER — CEFAZOLIN SODIUM-DEXTROSE 2-4 GM/100ML-% IV SOLN
INTRAVENOUS | Status: AC
  Filled 2021-12-28: qty 100

## 2021-12-28 MED ORDER — EPHEDRINE 5 MG/ML INJ
INTRAVENOUS | Status: AC
  Filled 2021-12-28: qty 5

## 2021-12-28 MED ORDER — CHLORHEXIDINE GLUCONATE 0.12 % MT SOLN: 15.0000 mL | Freq: Once | OROMUCOSAL | Status: AC

## 2021-12-28 MED ORDER — BUPIVACAINE-EPINEPHRINE 0.25% -1:200000 IJ SOLN
INTRAMUSCULAR | Status: DC | PRN
  Administered 2021-12-28: 10 mL

## 2021-12-28 MED ORDER — FENTANYL CITRATE (PF) 100 MCG/2ML IJ SOLN
INTRAMUSCULAR | Status: AC
  Filled 2021-12-28: qty 2

## 2021-12-28 MED ORDER — EPHEDRINE SULFATE (PRESSORS) 50 MG/ML IJ SOLN
INTRAMUSCULAR | Status: DC | PRN
  Administered 2021-12-28 (×3): 7.5 mg via INTRAVENOUS
  Administered 2021-12-28: 2.5 mg via INTRAVENOUS

## 2021-12-28 MED ORDER — ONDANSETRON HCL 4 MG/2ML IJ SOLN: 4.0000 mg | Freq: Once | INTRAMUSCULAR | Status: DC | PRN

## 2021-12-28 MED ORDER — DEXAMETHASONE SODIUM PHOSPHATE 4 MG/ML IJ SOLN
INTRAMUSCULAR | Status: DC | PRN
  Administered 2021-12-28: 4 mg via INTRAVENOUS

## 2021-12-28 SURGICAL SUPPLY — 41 items
BAG COUNTER SPONGE SURGICOUNT (BAG) IMPLANT
BLADE HEX COATED 2.75 (ELECTRODE) ×1 IMPLANT
BLADE SURG 15 STRL LF DISP TIS (BLADE) ×3 IMPLANT
BLADE SURG 15 STRL SS (BLADE) ×2
CHLORAPREP W/TINT 26 (MISCELLANEOUS) ×1 IMPLANT
COVER SURGICAL LIGHT HANDLE (MISCELLANEOUS) ×2 IMPLANT
DERMABOND ADVANCED (GAUZE/BANDAGES/DRESSINGS) ×1
DERMABOND ADVANCED .7 DNX12 (GAUZE/BANDAGES/DRESSINGS) IMPLANT
DRAPE LAPAROSCOPIC ABDOMINAL (DRAPES) ×2 IMPLANT
ELECT REM PT RETURN 15FT ADLT (MISCELLANEOUS) ×2 IMPLANT
GAUZE 4X4 16PLY ~~LOC~~+RFID DBL (SPONGE) ×2 IMPLANT
GAUZE SPONGE 4X4 12PLY STRL (GAUZE/BANDAGES/DRESSINGS) IMPLANT
GLOVE BIO SURGEON STRL SZ7 (GLOVE) ×2 IMPLANT
GLOVE BIOGEL PI IND STRL 7.0 (GLOVE) ×1 IMPLANT
GLOVE BIOGEL PI IND STRL 7.5 (GLOVE) ×1 IMPLANT
GLOVE BIOGEL PI INDICATOR 7.0 (GLOVE) ×1
GLOVE BIOGEL PI INDICATOR 7.5 (GLOVE) ×1
GOWN STRL REUS W/ TWL LRG LVL3 (GOWN DISPOSABLE) ×2 IMPLANT
GOWN STRL REUS W/ TWL XL LVL3 (GOWN DISPOSABLE) ×1 IMPLANT
GOWN STRL REUS W/TWL LRG LVL3 (GOWN DISPOSABLE) ×2
GOWN STRL REUS W/TWL XL LVL3 (GOWN DISPOSABLE) ×2
KIT BASIN OR (CUSTOM PROCEDURE TRAY) ×2 IMPLANT
KIT TURNOVER KIT A (KITS) IMPLANT
MARKER SKIN DUAL TIP RULER LAB (MISCELLANEOUS) ×2 IMPLANT
NDL HYPO 25X1 1.5 SAFETY (NEEDLE) ×1 IMPLANT
NEEDLE HYPO 22GX1.5 SAFETY (NEEDLE) IMPLANT
NEEDLE HYPO 25X1 1.5 SAFETY (NEEDLE) ×2 IMPLANT
PACK BASIC VI WITH GOWN DISP (CUSTOM PROCEDURE TRAY) ×2 IMPLANT
PENCIL SMOKE EVACUATOR (MISCELLANEOUS) ×1 IMPLANT
SPIKE FLUID TRANSFER (MISCELLANEOUS) ×2 IMPLANT
STRIP CLOSURE SKIN 1/2X4 (GAUZE/BANDAGES/DRESSINGS) ×2 IMPLANT
SUT MNCRL AB 4-0 PS2 18 (SUTURE) ×1 IMPLANT
SUT MON AB 5-0 PS2 18 (SUTURE) ×1 IMPLANT
SUT VIC AB 2-0 SH 27 (SUTURE) ×4
SUT VIC AB 2-0 SH 27X BRD (SUTURE) ×1 IMPLANT
SUT VIC AB 3-0 SH 27 (SUTURE) ×1
SUT VIC AB 3-0 SH 27XBRD (SUTURE) ×1 IMPLANT
SYR BULB IRRIG 60ML STRL (SYRINGE) IMPLANT
SYR CONTROL 10ML LL (SYRINGE) ×2 IMPLANT
TOWEL OR 17X26 10 PK STRL BLUE (TOWEL DISPOSABLE) ×2 IMPLANT
TOWEL OR NON WOVEN STRL DISP B (DISPOSABLE) ×2 IMPLANT

## 2021-12-29 ENCOUNTER — Encounter (HOSPITAL_COMMUNITY): Payer: Self-pay | Admitting: General Surgery

## 2021-12-31 LAB — SURGICAL PATHOLOGY

## 2022-01-02 ENCOUNTER — Other Ambulatory Visit: Payer: Self-pay | Admitting: *Deleted

## 2022-01-02 DIAGNOSIS — D0511 Intraductal carcinoma in situ of right breast: Secondary | ICD-10-CM

## 2022-01-10 ENCOUNTER — Encounter: Payer: Self-pay | Admitting: *Deleted

## 2022-01-16 NOTE — Progress Notes (Addendum)
Location of Breast Cancer:right   Histology per Pathology Report: Ductal carcinoma in situ   Receptor Status: ER(100%), PR (95%)  Did patient present with symptoms (if so, please note symptoms) or was this found on screening mammography?: mammogram  Past/Anticipated interventions by surgeon, if any: Right breast radioactive seed guided lumpectomy 11/22/2021 and right breast re-excision medial margin 12/28/2021  Past/Anticipated interventions by medical oncology, if any: will see Dr. Chryl Heck 01/29/22  Lymphedema issues, if any:  no   Pain issues, if any:  Reports throbbing pain occasionally in her right breast.  SAFETY ISSUES: Prior radiation? no Pacemaker/ICD? no Possible current pregnancy?no Is the patient on methotrexate? no  Current Complaints / other details:  Reports having swelling in both lower legs and ankles since surgery.   BP (!) 145/53 (BP Location: Left Arm, Patient Position: Sitting)   Pulse (!) 56   Temp 98 F (36.7 C) (Oral)   Resp 18   Ht '5\' 3"'$  (1.6 m)   Wt 175 lb (79.4 kg)   SpO2 98%   BMI 31.00 kg/m      Jacqulyn Liner, RN 01/16/2022,1:56 PM

## 2022-01-18 ENCOUNTER — Ambulatory Visit: Payer: Federal, State, Local not specified - PPO | Admitting: Hematology and Oncology

## 2022-01-22 NOTE — Progress Notes (Signed)
Radiation Oncology         [phone] ________________________________  Name: Theresa Shannon MRN: [id number]  Date: 01/23/2022  DOB: [birth year]  Re-Evaluation Note  CC: Burnard Bunting, MD  Benay Pike, MD  No diagnosis found.  Diagnosis:   Stage 0 (cTis (DCIS), cN0, cM0) Right Breast, Low-grade DCIS, ER+ / PR+ / Her2 not assessed  Narrative:  The patient returns today to discuss radiation treatment options. She was seen in the multidisciplinary breast clinic on 11/14/21.   Since consultation, she underwent genetic testing on 11/14/21. Results showed no clinically significant variants detected.  She opted to proceed with a right breast lumpectomy without nodal biopsies on 11/22/21 under the care of Dr. Donne Hazel. Pathology from the procedure revealed: low-grade DCIS measuring 3 cm with calcifications, partially involving a fibrotic papillary lesion. Right medial margin positive for DCIS; DCIS focally less than 0.1 cm from the final medical margin. Prognostic indicators significant for: estrogen receptor 100% positive and progesterone receptor 95% positive, both with strong staining intensity; Her2 not assessed.   The patient proceeded to undergo re-excision of the medial margin on 12/28/21. Final pathology showed low grade DCIS measuring 1 cm, with DCIS less than 0.1 cm from the final medial margin.    The patient will follow-up with Dr. Chryl Heck in the near future to further discuss antiestrogen/AI options.   Of note: the patient presented to the ED on 12/08/21 with bilateral LE edema. She was prescribed lasix. LE vascular US on 12/19/21 showed no signs of DVT in either LE.   On review of systems, the patient reports ***. She denies *** and any other symptoms.    Allergies:  is allergic to amlodipine, oxycodone-acetaminophen, and other.  Meds: Current Outpatient Medications  Medication Sig Dispense Refill   acetaminophen (TYLENOL) 325 MG tablet Take 650 mg by mouth every 6  (six) hours as needed for mild pain.     aspirin 81 MG chewable tablet Chew 81 mg by mouth daily.     atenolol (TENORMIN) 50 MG tablet Take 1 tablet (50 mg total) by mouth daily. 90 tablet 4   cholecalciferol (VITAMIN D) 1000 units tablet Take 1,000 Units by mouth daily.     fenofibrate (TRICOR) 145 MG tablet Take 1 tablet (145 mg total) by mouth daily. Please make overdue yearly appt with Dr. Acie Fredrickson before anymore refills. 2nd attempt 15 tablet 0   furosemide (LASIX) 40 MG tablet Take 1 tablet (40 mg total) by mouth daily. (Patient not taking: Reported on 12/14/2021) 7 tablet 0   levothyroxine (SYNTHROID, LEVOTHROID) 175 MCG tablet Take 175 mcg by mouth daily before breakfast.     metFORMIN (GLUCOPHAGE) 500 MG tablet Take 1,000 mg by mouth 2 (two) times daily with a meal.     Multiple Vitamin (MULTIVITAMIN WITH MINERALS) TABS tablet Take 1 tablet by mouth daily.     olmesartan-hydrochlorothiazide (BENICAR HCT) 40-25 MG tablet Take 1 tablet by mouth daily.  3   No current facility-administered medications for this encounter.    Physical Findings: The patient is in no acute distress. Patient is alert and oriented.  vitals were not taken for this visit.  No significant changes. Lungs are clear to auscultation bilaterally. Heart has regular rate and rhythm. No palpable cervical, supraclavicular, or axillary adenopathy. Abdomen soft, non-tender, normal bowel sounds. *** Breast: no palpable mass, nipple discharge or bleeding. *** Breast: ***  Lab Findings: Lab Results  Component Value Date   WBC 5.0 12/19/2021   HGB 10.6 (  L) 12/19/2021   HCT 34.9 (L) 12/19/2021   MCV 95.1 12/19/2021   PLT 199 12/19/2021    Radiographic Findings: No results found.  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 ***  -----------------------------------  Blair Promise,  PhD, MD  This document serves as a record of services personally performed by Gery Pray, MD. It was created on his behalf by Roney Mans, a trained medical scribe. The creation of this record is based on the scribe's personal observations and the provider's statements to them. This document has been checked and approved by the attending provider.

## 2022-01-23 ENCOUNTER — Other Ambulatory Visit: Payer: Self-pay

## 2022-01-23 ENCOUNTER — Encounter: Payer: Self-pay | Admitting: Radiation Oncology

## 2022-01-23 ENCOUNTER — Ambulatory Visit
Admission: RE | Admit: 2022-01-23 | Discharge: 2022-01-23 | Disposition: A | Discharge status: Home / Self Care | Payer: Federal, State, Local not specified - PPO | Source: Ambulatory Visit | Attending: Radiation Oncology | Admitting: Radiation Oncology

## 2022-01-23 VITALS — BP 145/53 | HR 56 | Temp 98.0°F | Resp 18 | Ht 63.0 in | Wt 175.0 lb

## 2022-01-23 DIAGNOSIS — Z7984 Long term (current) use of oral hypoglycemic drugs: Secondary | ICD-10-CM | POA: Insufficient documentation

## 2022-01-23 DIAGNOSIS — Z79899 Other long term (current) drug therapy: Secondary | ICD-10-CM | POA: Diagnosis not present

## 2022-01-23 DIAGNOSIS — Z510 Encounter for antineoplastic radiation therapy: Secondary | ICD-10-CM | POA: Diagnosis not present

## 2022-01-23 DIAGNOSIS — D0511 Intraductal carcinoma in situ of right breast: Secondary | ICD-10-CM | POA: Insufficient documentation

## 2022-01-23 DIAGNOSIS — Z7982 Long term (current) use of aspirin: Secondary | ICD-10-CM | POA: Insufficient documentation

## 2022-01-23 DIAGNOSIS — Z170 Estrogen receptor positive status [ER+]: Secondary | ICD-10-CM | POA: Insufficient documentation

## 2022-01-23 DIAGNOSIS — Z79890 Hormone replacement therapy (postmenopausal): Secondary | ICD-10-CM | POA: Diagnosis not present

## 2022-01-29 ENCOUNTER — Encounter: Payer: Self-pay | Admitting: Hematology and Oncology

## 2022-01-29 ENCOUNTER — Encounter: Payer: Self-pay | Admitting: *Deleted

## 2022-01-29 ENCOUNTER — Inpatient Hospital Stay
Payer: Federal, State, Local not specified - PPO | Attending: Hematology and Oncology | Admitting: Hematology and Oncology

## 2022-01-29 ENCOUNTER — Other Ambulatory Visit: Payer: Self-pay

## 2022-01-29 DIAGNOSIS — Z8051 Family history of malignant neoplasm of kidney: Secondary | ICD-10-CM | POA: Diagnosis not present

## 2022-01-29 DIAGNOSIS — Z7982 Long term (current) use of aspirin: Secondary | ICD-10-CM | POA: Diagnosis not present

## 2022-01-29 DIAGNOSIS — I129 Hypertensive chronic kidney disease with stage 1 through stage 4 chronic kidney disease, or unspecified chronic kidney disease: Secondary | ICD-10-CM | POA: Diagnosis not present

## 2022-01-29 DIAGNOSIS — K219 Gastro-esophageal reflux disease without esophagitis: Secondary | ICD-10-CM | POA: Diagnosis not present

## 2022-01-29 DIAGNOSIS — E039 Hypothyroidism, unspecified: Secondary | ICD-10-CM | POA: Insufficient documentation

## 2022-01-29 DIAGNOSIS — Z800 Family history of malignant neoplasm of digestive organs: Secondary | ICD-10-CM | POA: Insufficient documentation

## 2022-01-29 DIAGNOSIS — Z8673 Personal history of transient ischemic attack (TIA), and cerebral infarction without residual deficits: Secondary | ICD-10-CM | POA: Insufficient documentation

## 2022-01-29 DIAGNOSIS — Z79890 Hormone replacement therapy (postmenopausal): Secondary | ICD-10-CM | POA: Insufficient documentation

## 2022-01-29 DIAGNOSIS — Z803 Family history of malignant neoplasm of breast: Secondary | ICD-10-CM | POA: Diagnosis not present

## 2022-01-29 DIAGNOSIS — E785 Hyperlipidemia, unspecified: Secondary | ICD-10-CM | POA: Diagnosis not present

## 2022-01-29 DIAGNOSIS — Z87442 Personal history of urinary calculi: Secondary | ICD-10-CM | POA: Insufficient documentation

## 2022-01-29 DIAGNOSIS — D0511 Intraductal carcinoma in situ of right breast: Secondary | ICD-10-CM | POA: Insufficient documentation

## 2022-01-29 DIAGNOSIS — Z7984 Long term (current) use of oral hypoglycemic drugs: Secondary | ICD-10-CM | POA: Diagnosis not present

## 2022-01-29 DIAGNOSIS — Z170 Estrogen receptor positive status [ER+]: Secondary | ICD-10-CM | POA: Insufficient documentation

## 2022-01-29 DIAGNOSIS — E1122 Type 2 diabetes mellitus with diabetic chronic kidney disease: Secondary | ICD-10-CM | POA: Insufficient documentation

## 2022-01-29 DIAGNOSIS — Z79899 Other long term (current) drug therapy: Secondary | ICD-10-CM | POA: Diagnosis not present

## 2022-01-29 DIAGNOSIS — N183 Chronic kidney disease, stage 3 unspecified: Secondary | ICD-10-CM | POA: Insufficient documentation

## 2022-01-29 NOTE — Progress Notes (Signed)
[facility] NOTE  Patient Care Team: Burnard Bunting, MD as PCP - General (Internal Medicine) Rolm Bookbinder, MD as Consulting Physician (General Surgery) Benay Pike, MD as Consulting Physician (Hematology and Oncology) Gery Pray, MD as Consulting Physician (Radiation Oncology) Mauro Kaufmann, RN as Oncology Nurse Navigator Rockwell Germany, RN as Oncology Nurse Navigator  CHIEF COMPLAINTS/PURPOSE OF CONSULTATION:  Newly diagnosed breast cancer, DCIS  HISTORY OF PRESENTING ILLNESS:  Theresa Shannon 76 y.o. female is here because of recent diagnosis of right breast DCIS  I reviewed her records extensively and collaborated the history with the patient.  SUMMARY OF ONCOLOGIC HISTORY: Oncology History  Ductal carcinoma in situ (DCIS) of right breast  10/15/2021 Mammogram   Digital screening bilateral mammogram showed indeterminate calcifications in the right breast.   10/31/2021 Pathology Results   Pathology from the breast biopsy showed low-grade DCIS with calcifications rest of the needle core biopsy showed fibrocystic changes with usual ductal hyperplasia and vascular calcifications ER 100% positive strong staining, PR 95% positive strong staining   11/12/2021 Initial Diagnosis   Ductal carcinoma in situ (DCIS) of right breast   11/14/2021 Cancer Staging   Staging form: Breast, AJCC 8th Edition - Clinical stage from 11/14/2021: Stage 0 (cTis (DCIS), cN0, cM0, ER+, PR+, HER2: Not Assessed) - Signed by Benay Pike, MD on 11/14/2021 Stage prefix: Initial diagnosis Nuclear grade: G1 Laterality: Right Staged by: Pathologist and managing physician Stage used in treatment planning: Yes National guidelines used in treatment planning: Yes Type of national guideline used in treatment planning: NCCN    Genetic Testing   [facility] CustomNext Panel was Negative. Report date is 12/05/2021.  The CustomNext-Cancer+RNAinsight panel offered by [facility]  includes sequencing and rearrangement analysis for the following 52 genes :  APC, ATM, AXIN2, BAP1, BARD1, BMPR1A, BRCA1, BRCA2, BRIP1, CDH1, CDK4, CDKN2A, CHEK2, CTNNA1, DICER1, EPCAM, FH, FLCN, GREM1, HOXB13, KIT, MEN1, MET, MITF, MLH1, MSH2, MSH3, MSH6, MUTYH, NBN, NF1, NTHL1, PALB2, PDGFRA, PMS2, POLD1, POLE, PTEN, RAD50, RAD51C, RAD51D, SDHA, SDHB, SDHC, SDHD, SMAD4, SMARCA4, STK11, TP53, TSC1, TSC2, and VHL.  RNA data is routinely analyzed for use in variant interpretation for all genes.    12/28/2021 Surgery   Right breast lumpectomy initially done on 518 showed DCIS with calcifications partially involving a fibrotic papillary lesion, focally less than 1 mm from final medial margin.  She underwent reexcision and this once again showed low-grade DCIS measuring 1 cm again 1 mm from the final medial margin fibrosis and inflammation consistent with previous lumpectomy. Prior prognostic showed ER 100% positive strong staining and PR 95% positive strong staining    Patient is here for follow-up since her right breast lumpectomy which showed DCIS.  She underwent reexcision and this once again showed low-grade DCIS with very close margins.  She has met with Dr. Sondra Come and is agreeable to proceed with adjuvant radiation. 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   Chronic kidney disease    hypertensive chronic kideny disease benign/ stage 3   Diabetes mellitus, type 2 (HCC)    Esophagitis determined by endoscopy 03/2015   GERD (gastroesophageal reflux disease)    Gout 04/2015   L toe  History of frequent urinary tract infections    History of hiatal hernia    History of kidney stones    HTN (hypertension)    Hyperlipemia    Hypertension    Hypothyroidism     Peripheral neuropathy    Peripheral vascular disease (HCC)    Spondylolisthesis of lumbar region    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; right eye    Thyroid goiter    multinodular    Wears glasses     SURGICAL HISTORY: Past Surgical History:  Procedure Laterality Date   ABDOMINAL HYSTERECTOMY     ABSCESS DRAINAGE  07/08/1993   breast   ANTERIOR LAT LUMBAR FUSION N/A 02/02/2018   Procedure: Anterolateral decompression Lumbar Four-Five with xlif percutaneous fixation with robotic placement of screws Lumbar four-five;  Surgeon: Kristeen Miss, MD;  Location: [facility];  Service: Neurosurgery;  Laterality: N/A;   APPENDECTOMY     APPLICATION OF ROBOTIC ASSISTANCE FOR SPINAL PROCEDURE N/A 02/02/2018   Procedure: APPLICATION OF ROBOTIC ASSISTANCE FOR SPINAL PROCEDURE;  Surgeon: Kristeen Miss, MD;  Location: [place];  Service: Neurosurgery;  Laterality: N/A;   BACK SURGERY     BREAST BIOPSY     BREAST CYST EXCISION  02/26/2012   Procedure: CYST EXCISION BREAST;  Surgeon: Harl Bowie, MD;  Location: [place];  Service: General;  Laterality: Left;  excision chronic cyst left breast   BREAST LUMPECTOMY WITH RADIOACTIVE SEED LOCALIZATION Right 11/22/2021   Procedure: RIGHT BREAST LUMPECTOMY WITH RADIOACTIVE SEED LOCALIZATION;  Surgeon: Rolm Bookbinder, MD;  Location: [place];  Service: General;  Laterality: Right;   BUNIONECTOMY     LEFT   CHOLECYSTECTOMY N/A 08/17/2015   Procedure: LAPAROSCOPIC CHOLECYSTECTOMY WITH INTRAOPERATIVE CHOLANGIOGRAM;  Surgeon: Armandina Gemma, MD;  Location: [facility];  Service: General;  Laterality: N/A;   DILATION AND CURETTAGE OF UTERUS     KNEE SURGERY     BILATERAL   LUMBAR PERCUTANEOUS PEDICLE SCREW 1 LEVEL N/A 02/02/2018   Procedure: LUMBAR PERCUTANEOUS PEDICLE SCREW LUMBAR FOUR-FIVE;  Surgeon: Kristeen Miss, MD;  Location: [facility];  Service: Neurosurgery;  Laterality: N/A;   RE-EXCISION OF BREAST LUMPECTOMY Right 12/28/2021   Procedure:  RE-EXCISION OF RIGHT BREAST MARGIN;  Surgeon: Rolm Bookbinder, MD;  Location: [facility];  Service: General;  Laterality: Right;   TEMPORAL ARTERY BIOPSY / LIGATION     TONSILLECTOMY AND ADENOIDECTOMY     TUBAL LIGATION     TUMOR REMOVAL     LEFT ARM-lipoma    SOCIAL HISTORY: Social History   Socioeconomic History   Marital status: Married    Spouse name: Elenore Rota   Number of children: 1   Years of education: 12th   Highest education level: Not on file  Occupational History   Occupation: Retired  Tobacco Use   Smoking status: Never   Smokeless tobacco: Never  Vaping Use   Vaping Use: Never used  Substance and Sexual Activity   Alcohol use: No   Drug use: No   Sexual activity: Not on file  Other Topics Concern   Not on file  Social History Narrative   Patient lives at home with husband Elenore Rota.    Patient has 1 child.    Patient is retired.    Patient has a high school education.    Social Determinants of Health   Financial Resource Strain: Low Risk  (11/14/2021)   Overall Financial Resource Strain (CARDIA)    Difficulty of Paying Living Expenses: Not  very hard  Food Insecurity: No Food Insecurity (11/14/2021)   Hunger Vital Sign    Worried About Running Out of Food in the Last Year: Never true    Ran Out of Food in the Last Year: Never true  Transportation Needs: No Transportation Needs (11/14/2021)   PRAPARE - Hydrologist (Medical): No    Lack of Transportation (Non-Medical): No  Physical Activity: Not on file  Stress: Not on file  Social Connections: Not on file  Intimate Partner Violence: Not on file    FAMILY HISTORY: Family History  Problem Relation Age of Onset   Dementia Mother    Kidney disease Mother    Cancer Mother    Diabetes Mother    Breast cancer Mother 29   Dementia Father    Heart disease Father    Hypertension Father    Stroke Father    Breast cancer Sister 22   Kidney cancer Sister 60   Leukemia Maternal  Aunt    Pancreatic cancer Paternal Aunt     ALLERGIES:  is allergic to amlodipine, oxycodone-acetaminophen, and other.  MEDICATIONS:  Current Outpatient Medications  Medication Sig Dispense Refill   acetaminophen (TYLENOL) 325 MG tablet Take 650 mg by mouth every 6 (six) hours as needed for mild pain.     aspirin 81 MG chewable tablet Chew 81 mg by mouth daily.     atenolol (TENORMIN) 50 MG tablet Take 1 tablet (50 mg total) by mouth daily. 90 tablet 4   cholecalciferol (VITAMIN D) 1000 units tablet Take 1,000 Units by mouth daily.     fenofibrate (TRICOR) 145 MG tablet Take 1 tablet (145 mg total) by mouth daily. Please make overdue yearly appt with Dr. Acie Fredrickson before anymore refills. 2nd attempt 15 tablet 0   furosemide (LASIX) 40 MG tablet Take 1 tablet (40 mg total) by mouth daily. (Patient not taking: Reported on 12/14/2021) 7 tablet 0   levothyroxine (SYNTHROID, LEVOTHROID) 175 MCG tablet Take 175 mcg by mouth daily before breakfast.     metFORMIN (GLUCOPHAGE) 500 MG tablet Take 1,000 mg by mouth 2 (two) times daily with a meal.     Multiple Vitamin (MULTIVITAMIN WITH MINERALS) TABS tablet Take 1 tablet by mouth daily.     olmesartan-hydrochlorothiazide (BENICAR HCT) 40-25 MG tablet Take 1 tablet by mouth daily.  3   No current facility-administered medications for this visit.    REVIEW OF SYSTEMS:   Constitutional: Denies fevers, chills or abnormal night sweats Eyes: Denies blurriness of vision, double vision or watery eyes Ears, nose, mouth, throat, and face: Denies mucositis or sore throat Respiratory: Denies cough, dyspnea or wheezes Cardiovascular: Denies palpitation, chest discomfort or lower extremity swelling Gastrointestinal:  Denies nausea, heartburn or change in bowel habits Skin: Denies abnormal skin rashes Lymphatics: Denies new lymphadenopathy or easy bruising Neurological:Denies numbness, tingling or new weaknesses Behavioral/Psych: Mood is stable, no new changes   Breast: Denies any palpable lumps or discharge All other systems were reviewed with the patient and are negative.  PHYSICAL EXAMINATION: ECOG PERFORMANCE STATUS: 0 - Asymptomatic  Vitals:   01/29/22 1601  BP: (!) 162/53  Pulse: 73  Resp: 16  Temp: 98.2 F (36.8 C)  SpO2: 98%   Filed Weights   01/29/22 1601  Weight: 176 lb 6.4 oz (80 kg)    Physical examination deferred today in lieu of counseling LABORATORY DATA:  I have reviewed the data as listed Lab Results  Component Value Date  WBC 5.0 12/19/2021   HGB 10.6 (L) 12/19/2021   HCT 34.9 (L) 12/19/2021   MCV 95.1 12/19/2021   PLT 199 12/19/2021   Lab Results  Component Value Date   NA 140 12/19/2021   K 5.1 12/19/2021   CL 111 12/19/2021   CO2 19 (L) 12/19/2021    RADIOGRAPHIC STUDIES: I have personally reviewed the radiological reports and agreed with the findings in the report.  ASSESSMENT AND PLAN:    Ductal carcinoma in situ (DCIS) of right breast This is a very pleasant 76 year old female patient with right breast DCIS, ER/PR positive status post right breast lumpectomy with reexcision, final pathology showing DCIS with close margin, prior prognostic showed ER/PR positive DCIS now here to discuss antiestrogen therapy. We have today once again discussed about role of antiestrogen therapy.  We have discussed about tamoxifen versus aromatase inhibitors, mechanism of action, adverse effects with each class, benefits of antiestrogen therapy which includes reduce risk of recurrence in both ipsilateral as well as contralateral breast as well as reduce risk of invasive breast cancer. Given her prior episode of blood clot in her eye and blindness in the right eye, she is more interested in aromatase inhibitors.  Information printed and given to her for further education. Anticipate completion of radiation in about 5 weeks.  She will return to clinic in about 8 weeks to review any additional questions and to initiate  antiestrogen therapy.  Total time spent: 30 minutes. All questions were answered. The patient knows to call the clinic with any problems, questions or concerns.    Benay Pike, MD 01/29/22

## 2022-01-29 NOTE — Assessment & Plan Note (Addendum)
This is a very pleasant 76 year old female patient with right breast DCIS, ER/PR positive status post right breast lumpectomy with reexcision, final pathology showing DCIS with close margin, prior prognostic showed ER/PR positive DCIS now here to discuss antiestrogen therapy. We have today once again discussed about role of antiestrogen therapy.  We have discussed about tamoxifen versus aromatase inhibitors, mechanism of action, adverse effects with each class, benefits of antiestrogen therapy which includes reduce risk of recurrence in both ipsilateral as well as contralateral breast as well as reduce risk of invasive breast cancer. Given her prior episode of blood clot in her eye and blindness in the right eye, she is more interested in aromatase inhibitors.  Information printed and given to her for further education. Anticipate completion of radiation in about 5 weeks.  She will return to clinic in about 8 weeks to review any additional questions and to initiate antiestrogen therapy.

## 2022-02-04 ENCOUNTER — Ambulatory Visit: Payer: Federal, State, Local not specified - PPO | Admitting: Radiation Oncology

## 2022-02-05 ENCOUNTER — Ambulatory Visit
Admission: RE | Admit: 2022-02-05 | Discharge: 2022-02-05 | Disposition: A | Discharge status: Home / Self Care | Payer: Federal, State, Local not specified - PPO | Source: Ambulatory Visit | Attending: Radiation Oncology | Admitting: Radiation Oncology

## 2022-02-05 ENCOUNTER — Other Ambulatory Visit: Payer: Self-pay

## 2022-02-05 ENCOUNTER — Ambulatory Visit: Payer: Federal, State, Local not specified - PPO

## 2022-02-05 DIAGNOSIS — D0511 Intraductal carcinoma in situ of right breast: Secondary | ICD-10-CM | POA: Diagnosis not present

## 2022-02-05 DIAGNOSIS — Z510 Encounter for antineoplastic radiation therapy: Secondary | ICD-10-CM | POA: Insufficient documentation

## 2022-02-05 LAB — RAD ONC ARIA SESSION SUMMARY
Course Elapsed Days: 0
Plan Fractions Treated to Date: 1
Plan Prescribed Dose Per Fraction: 2.67 Gy
Plan Total Fractions Prescribed: 15
Plan Total Prescribed Dose: 40.05 Gy
Reference Point Dosage Given to Date: 2.67 Gy
Reference Point Session Dosage Given: 2.67 Gy
Session Number: 1

## 2022-02-05 MED ORDER — RADIAPLEXRX EX GEL: Freq: Once | CUTANEOUS | Status: AC

## 2022-02-05 MED ORDER — ALRA NON-METALLIC DEODORANT (RAD-ONC)
1.0000 | Freq: Once | TOPICAL | Status: AC
  Administered 2022-02-05: 1 via TOPICAL

## 2022-02-05 NOTE — Progress Notes (Signed)
Pt here for patient teaching.    Pt given Radiation and You booklet, skin care instructions, Alra deodorant, and Radiaplex gel.    Reviewed areas of pertinence such as fatigue, hair loss in treatment field, skin changes, breast tenderness, and breast swelling .   Pt able to give teach back of to pat skin, use unscented/gentle soap, and drink plenty of water,apply Radiaplex bid, avoid applying anything to skin within 4 hours of treatment, avoid wearing an under wire bra, and to use an electric razor if they must shave.   Pt verbalizes understanding of information given and will contact nursing with any questions or concerns.         

## 2022-02-06 ENCOUNTER — Ambulatory Visit
Admission: RE | Admit: 2022-02-06 | Discharge: 2022-02-06 | Disposition: A | Discharge status: Home / Self Care | Payer: Federal, State, Local not specified - PPO | Source: Ambulatory Visit | Attending: Radiation Oncology | Admitting: Radiation Oncology

## 2022-02-06 ENCOUNTER — Other Ambulatory Visit: Payer: Self-pay

## 2022-02-06 DIAGNOSIS — D0511 Intraductal carcinoma in situ of right breast: Secondary | ICD-10-CM | POA: Diagnosis not present

## 2022-02-06 LAB — RAD ONC ARIA SESSION SUMMARY
Course Elapsed Days: 1
Plan Fractions Treated to Date: 2
Plan Prescribed Dose Per Fraction: 2.67 Gy
Plan Total Fractions Prescribed: 15
Plan Total Prescribed Dose: 40.05 Gy
Reference Point Dosage Given to Date: 5.34 Gy
Reference Point Session Dosage Given: 2.67 Gy
Session Number: 2

## 2022-02-07 ENCOUNTER — Other Ambulatory Visit: Payer: Self-pay

## 2022-02-07 ENCOUNTER — Ambulatory Visit
Admission: RE | Admit: 2022-02-07 | Discharge: 2022-02-07 | Disposition: A | Discharge status: Home / Self Care | Payer: Federal, State, Local not specified - PPO | Source: Ambulatory Visit | Attending: Radiation Oncology | Admitting: Radiation Oncology

## 2022-02-07 DIAGNOSIS — D0511 Intraductal carcinoma in situ of right breast: Secondary | ICD-10-CM | POA: Diagnosis not present

## 2022-02-07 LAB — RAD ONC ARIA SESSION SUMMARY
Course Elapsed Days: 2
Plan Fractions Treated to Date: 3
Plan Prescribed Dose Per Fraction: 2.67 Gy
Plan Total Fractions Prescribed: 15
Plan Total Prescribed Dose: 40.05 Gy
Reference Point Dosage Given to Date: 8.01 Gy
Reference Point Session Dosage Given: 2.67 Gy
Session Number: 3

## 2022-02-08 ENCOUNTER — Other Ambulatory Visit: Payer: Self-pay

## 2022-02-08 ENCOUNTER — Ambulatory Visit
Admission: RE | Admit: 2022-02-08 | Discharge: 2022-02-08 | Disposition: A | Discharge status: Home / Self Care | Payer: Federal, State, Local not specified - PPO | Source: Ambulatory Visit | Attending: Radiation Oncology | Admitting: Radiation Oncology

## 2022-02-08 DIAGNOSIS — D0511 Intraductal carcinoma in situ of right breast: Secondary | ICD-10-CM | POA: Diagnosis not present

## 2022-02-08 LAB — RAD ONC ARIA SESSION SUMMARY
Course Elapsed Days: 3
Plan Fractions Treated to Date: 4
Plan Prescribed Dose Per Fraction: 2.67 Gy
Plan Total Fractions Prescribed: 15
Plan Total Prescribed Dose: 40.05 Gy
Reference Point Dosage Given to Date: 10.68 Gy
Reference Point Session Dosage Given: 2.67 Gy
Session Number: 4

## 2022-02-11 ENCOUNTER — Ambulatory Visit
Admission: RE | Admit: 2022-02-11 | Discharge: 2022-02-11 | Disposition: A | Discharge status: Home / Self Care | Payer: Federal, State, Local not specified - PPO | Source: Ambulatory Visit | Attending: Radiation Oncology | Admitting: Radiation Oncology

## 2022-02-11 ENCOUNTER — Other Ambulatory Visit: Payer: Self-pay

## 2022-02-11 DIAGNOSIS — D0511 Intraductal carcinoma in situ of right breast: Secondary | ICD-10-CM | POA: Diagnosis not present

## 2022-02-11 LAB — RAD ONC ARIA SESSION SUMMARY
Course Elapsed Days: 6
Plan Fractions Treated to Date: 5
Plan Prescribed Dose Per Fraction: 2.67 Gy
Plan Total Fractions Prescribed: 15
Plan Total Prescribed Dose: 40.05 Gy
Reference Point Dosage Given to Date: 13.35 Gy
Reference Point Session Dosage Given: 2.67 Gy
Session Number: 5

## 2022-02-12 ENCOUNTER — Other Ambulatory Visit: Payer: Self-pay

## 2022-02-12 ENCOUNTER — Ambulatory Visit: Payer: Federal, State, Local not specified - PPO

## 2022-02-12 ENCOUNTER — Ambulatory Visit
Admission: RE | Admit: 2022-02-12 | Discharge: 2022-02-12 | Disposition: A | Discharge status: Home / Self Care | Payer: Federal, State, Local not specified - PPO | Source: Ambulatory Visit | Attending: Radiation Oncology | Admitting: Radiation Oncology

## 2022-02-12 DIAGNOSIS — D0511 Intraductal carcinoma in situ of right breast: Secondary | ICD-10-CM | POA: Diagnosis not present

## 2022-02-12 LAB — RAD ONC ARIA SESSION SUMMARY
Course Elapsed Days: 7
Plan Fractions Treated to Date: 6
Plan Prescribed Dose Per Fraction: 2.67 Gy
Plan Total Fractions Prescribed: 15
Plan Total Prescribed Dose: 40.05 Gy
Reference Point Dosage Given to Date: 16.02 Gy
Reference Point Session Dosage Given: 2.67 Gy
Session Number: 6

## 2022-02-13 ENCOUNTER — Ambulatory Visit
Admission: RE | Admit: 2022-02-13 | Discharge: 2022-02-13 | Disposition: A | Discharge status: Home / Self Care | Payer: Federal, State, Local not specified - PPO | Source: Ambulatory Visit | Attending: Radiation Oncology | Admitting: Radiation Oncology

## 2022-02-13 ENCOUNTER — Other Ambulatory Visit: Payer: Self-pay

## 2022-02-13 DIAGNOSIS — D0511 Intraductal carcinoma in situ of right breast: Secondary | ICD-10-CM | POA: Diagnosis not present

## 2022-02-13 LAB — RAD ONC ARIA SESSION SUMMARY
Course Elapsed Days: 8
Plan Fractions Treated to Date: 7
Plan Prescribed Dose Per Fraction: 2.67 Gy
Plan Total Fractions Prescribed: 15
Plan Total Prescribed Dose: 40.05 Gy
Reference Point Dosage Given to Date: 18.69 Gy
Reference Point Session Dosage Given: 2.67 Gy
Session Number: 7

## 2022-02-14 ENCOUNTER — Other Ambulatory Visit: Payer: Self-pay

## 2022-02-14 ENCOUNTER — Ambulatory Visit
Admission: RE | Admit: 2022-02-14 | Discharge: 2022-02-14 | Disposition: A | Discharge status: Home / Self Care | Payer: Federal, State, Local not specified - PPO | Source: Ambulatory Visit | Attending: Radiation Oncology | Admitting: Radiation Oncology

## 2022-02-14 DIAGNOSIS — D0511 Intraductal carcinoma in situ of right breast: Secondary | ICD-10-CM | POA: Diagnosis not present

## 2022-02-14 LAB — RAD ONC ARIA SESSION SUMMARY
Course Elapsed Days: 9
Plan Fractions Treated to Date: 8
Plan Prescribed Dose Per Fraction: 2.67 Gy
Plan Total Fractions Prescribed: 15
Plan Total Prescribed Dose: 40.05 Gy
Reference Point Dosage Given to Date: 21.36 Gy
Reference Point Session Dosage Given: 2.67 Gy
Session Number: 8

## 2022-02-15 ENCOUNTER — Ambulatory Visit
Admission: RE | Admit: 2022-02-15 | Discharge: 2022-02-15 | Disposition: A | Discharge status: Home / Self Care | Payer: Federal, State, Local not specified - PPO | Source: Ambulatory Visit | Attending: Radiation Oncology | Admitting: Radiation Oncology

## 2022-02-15 ENCOUNTER — Other Ambulatory Visit: Payer: Self-pay

## 2022-02-15 DIAGNOSIS — D0511 Intraductal carcinoma in situ of right breast: Secondary | ICD-10-CM | POA: Diagnosis not present

## 2022-02-15 LAB — RAD ONC ARIA SESSION SUMMARY
Course Elapsed Days: 10
Plan Fractions Treated to Date: 9
Plan Prescribed Dose Per Fraction: 2.67 Gy
Plan Total Fractions Prescribed: 15
Plan Total Prescribed Dose: 40.05 Gy
Reference Point Dosage Given to Date: 24.03 Gy
Reference Point Session Dosage Given: 2.67 Gy
Session Number: 9

## 2022-02-18 ENCOUNTER — Other Ambulatory Visit: Payer: Self-pay

## 2022-02-18 ENCOUNTER — Encounter: Payer: Self-pay | Admitting: *Deleted

## 2022-02-18 ENCOUNTER — Ambulatory Visit
Admission: RE | Admit: 2022-02-18 | Discharge: 2022-02-18 | Disposition: A | Discharge status: Home / Self Care | Payer: Federal, State, Local not specified - PPO | Source: Ambulatory Visit | Attending: Radiation Oncology | Admitting: Radiation Oncology

## 2022-02-18 DIAGNOSIS — D0511 Intraductal carcinoma in situ of right breast: Secondary | ICD-10-CM | POA: Diagnosis not present

## 2022-02-18 LAB — RAD ONC ARIA SESSION SUMMARY
Course Elapsed Days: 13
Plan Fractions Treated to Date: 10
Plan Prescribed Dose Per Fraction: 2.67 Gy
Plan Total Fractions Prescribed: 15
Plan Total Prescribed Dose: 40.05 Gy
Reference Point Dosage Given to Date: 26.7 Gy
Reference Point Session Dosage Given: 2.67 Gy
Session Number: 10

## 2022-02-19 ENCOUNTER — Ambulatory Visit: Payer: Federal, State, Local not specified - PPO | Admitting: Radiation Oncology

## 2022-02-19 ENCOUNTER — Ambulatory Visit
Admission: RE | Admit: 2022-02-19 | Discharge: 2022-02-19 | Disposition: A | Discharge status: Home / Self Care | Payer: Federal, State, Local not specified - PPO | Source: Ambulatory Visit | Attending: Radiation Oncology | Admitting: Radiation Oncology

## 2022-02-19 ENCOUNTER — Other Ambulatory Visit: Payer: Self-pay

## 2022-02-19 ENCOUNTER — Ambulatory Visit: Payer: Federal, State, Local not specified - PPO

## 2022-02-19 DIAGNOSIS — D0511 Intraductal carcinoma in situ of right breast: Secondary | ICD-10-CM | POA: Diagnosis not present

## 2022-02-19 LAB — RAD ONC ARIA SESSION SUMMARY
Course Elapsed Days: 14
Plan Fractions Treated to Date: 11
Plan Prescribed Dose Per Fraction: 2.67 Gy
Plan Total Fractions Prescribed: 15
Plan Total Prescribed Dose: 40.05 Gy
Reference Point Dosage Given to Date: 29.37 Gy
Reference Point Session Dosage Given: 2.67 Gy
Session Number: 11

## 2022-02-19 MED ORDER — RADIAPLEXRX EX GEL: Freq: Once | CUTANEOUS | Status: AC

## 2022-02-20 ENCOUNTER — Other Ambulatory Visit: Payer: Self-pay

## 2022-02-20 ENCOUNTER — Ambulatory Visit
Admission: RE | Admit: 2022-02-20 | Discharge: 2022-02-20 | Disposition: A | Discharge status: Home / Self Care | Payer: Federal, State, Local not specified - PPO | Source: Ambulatory Visit | Attending: Radiation Oncology | Admitting: Radiation Oncology

## 2022-02-20 DIAGNOSIS — D0511 Intraductal carcinoma in situ of right breast: Secondary | ICD-10-CM | POA: Diagnosis not present

## 2022-02-20 LAB — RAD ONC ARIA SESSION SUMMARY
Course Elapsed Days: 15
Plan Fractions Treated to Date: 12
Plan Prescribed Dose Per Fraction: 2.67 Gy
Plan Total Fractions Prescribed: 15
Plan Total Prescribed Dose: 40.05 Gy
Reference Point Dosage Given to Date: 32.04 Gy
Reference Point Session Dosage Given: 2.67 Gy
Session Number: 12

## 2022-02-21 ENCOUNTER — Ambulatory Visit: Payer: Federal, State, Local not specified - PPO

## 2022-02-22 ENCOUNTER — Other Ambulatory Visit: Payer: Self-pay

## 2022-02-22 ENCOUNTER — Ambulatory Visit
Admission: RE | Admit: 2022-02-22 | Discharge: 2022-02-22 | Disposition: A | Discharge status: Home / Self Care | Payer: Federal, State, Local not specified - PPO | Source: Ambulatory Visit | Attending: Radiation Oncology | Admitting: Radiation Oncology

## 2022-02-22 DIAGNOSIS — D0511 Intraductal carcinoma in situ of right breast: Secondary | ICD-10-CM | POA: Diagnosis not present

## 2022-02-22 LAB — RAD ONC ARIA SESSION SUMMARY
Course Elapsed Days: 17
Plan Fractions Treated to Date: 13
Plan Prescribed Dose Per Fraction: 2.67 Gy
Plan Total Fractions Prescribed: 15
Plan Total Prescribed Dose: 40.05 Gy
Reference Point Dosage Given to Date: 34.71 Gy
Reference Point Session Dosage Given: 2.67 Gy
Session Number: 13

## 2022-02-25 ENCOUNTER — Ambulatory Visit: Payer: Federal, State, Local not specified - PPO

## 2022-02-25 ENCOUNTER — Ambulatory Visit
Admission: RE | Admit: 2022-02-25 | Discharge: 2022-02-25 | Disposition: A | Discharge status: Home / Self Care | Payer: Federal, State, Local not specified - PPO | Source: Ambulatory Visit | Attending: Radiation Oncology | Admitting: Radiation Oncology

## 2022-02-25 ENCOUNTER — Other Ambulatory Visit: Payer: Self-pay

## 2022-02-25 DIAGNOSIS — D0511 Intraductal carcinoma in situ of right breast: Secondary | ICD-10-CM | POA: Diagnosis not present

## 2022-02-25 LAB — RAD ONC ARIA SESSION SUMMARY
Course Elapsed Days: 20
Plan Fractions Treated to Date: 14
Plan Prescribed Dose Per Fraction: 2.67 Gy
Plan Total Fractions Prescribed: 15
Plan Total Prescribed Dose: 40.05 Gy
Reference Point Dosage Given to Date: 37.38 Gy
Reference Point Session Dosage Given: 2.67 Gy
Session Number: 14

## 2022-02-26 ENCOUNTER — Ambulatory Visit: Payer: Federal, State, Local not specified - PPO

## 2022-02-26 ENCOUNTER — Other Ambulatory Visit: Payer: Self-pay

## 2022-02-26 ENCOUNTER — Ambulatory Visit
Admission: RE | Admit: 2022-02-26 | Discharge: 2022-02-26 | Disposition: A | Discharge status: Home / Self Care | Payer: Federal, State, Local not specified - PPO | Source: Ambulatory Visit | Attending: Radiation Oncology | Admitting: Radiation Oncology

## 2022-02-26 DIAGNOSIS — D0511 Intraductal carcinoma in situ of right breast: Secondary | ICD-10-CM | POA: Diagnosis not present

## 2022-02-26 LAB — RAD ONC ARIA SESSION SUMMARY
Course Elapsed Days: 21
Plan Fractions Treated to Date: 15
Plan Prescribed Dose Per Fraction: 2.67 Gy
Plan Total Fractions Prescribed: 15
Plan Total Prescribed Dose: 40.05 Gy
Reference Point Dosage Given to Date: 40.05 Gy
Reference Point Session Dosage Given: 2.67 Gy
Session Number: 15

## 2022-02-27 ENCOUNTER — Ambulatory Visit: Payer: Federal, State, Local not specified - PPO

## 2022-02-27 ENCOUNTER — Other Ambulatory Visit: Payer: Self-pay

## 2022-02-27 ENCOUNTER — Ambulatory Visit
Admission: RE | Admit: 2022-02-27 | Discharge: 2022-02-27 | Disposition: A | Discharge status: Home / Self Care | Payer: Federal, State, Local not specified - PPO | Source: Ambulatory Visit | Attending: Radiation Oncology | Admitting: Radiation Oncology

## 2022-02-27 DIAGNOSIS — D0511 Intraductal carcinoma in situ of right breast: Secondary | ICD-10-CM | POA: Diagnosis not present

## 2022-02-27 LAB — RAD ONC ARIA SESSION SUMMARY
Course Elapsed Days: 22
Plan Fractions Treated to Date: 1
Plan Prescribed Dose Per Fraction: 2 Gy
Plan Total Fractions Prescribed: 6
Plan Total Prescribed Dose: 12 Gy
Reference Point Dosage Given to Date: 42.05 Gy
Reference Point Session Dosage Given: 2 Gy
Session Number: 16

## 2022-02-28 ENCOUNTER — Other Ambulatory Visit: Payer: Self-pay

## 2022-02-28 ENCOUNTER — Ambulatory Visit
Admission: RE | Admit: 2022-02-28 | Discharge: 2022-02-28 | Disposition: A | Discharge status: Home / Self Care | Payer: Federal, State, Local not specified - PPO | Source: Ambulatory Visit | Attending: Radiation Oncology | Admitting: Radiation Oncology

## 2022-02-28 DIAGNOSIS — D0511 Intraductal carcinoma in situ of right breast: Secondary | ICD-10-CM | POA: Diagnosis not present

## 2022-02-28 LAB — RAD ONC ARIA SESSION SUMMARY
Course Elapsed Days: 23
Plan Fractions Treated to Date: 2
Plan Prescribed Dose Per Fraction: 2 Gy
Plan Total Fractions Prescribed: 6
Plan Total Prescribed Dose: 12 Gy
Reference Point Dosage Given to Date: 44.05 Gy
Reference Point Session Dosage Given: 2 Gy
Session Number: 17

## 2022-03-01 ENCOUNTER — Ambulatory Visit
Admission: RE | Admit: 2022-03-01 | Discharge: 2022-03-01 | Disposition: A | Discharge status: Home / Self Care | Payer: Federal, State, Local not specified - PPO | Source: Ambulatory Visit | Attending: Radiation Oncology | Admitting: Radiation Oncology

## 2022-03-01 ENCOUNTER — Other Ambulatory Visit: Payer: Self-pay

## 2022-03-01 DIAGNOSIS — D0511 Intraductal carcinoma in situ of right breast: Secondary | ICD-10-CM | POA: Diagnosis not present

## 2022-03-01 LAB — RAD ONC ARIA SESSION SUMMARY
Course Elapsed Days: 24
Plan Fractions Treated to Date: 3
Plan Prescribed Dose Per Fraction: 2 Gy
Plan Total Fractions Prescribed: 6
Plan Total Prescribed Dose: 12 Gy
Reference Point Dosage Given to Date: 46.05 Gy
Reference Point Session Dosage Given: 2 Gy
Session Number: 18

## 2022-03-04 ENCOUNTER — Ambulatory Visit
Admission: RE | Admit: 2022-03-04 | Discharge: 2022-03-04 | Disposition: A | Discharge status: Home / Self Care | Payer: Federal, State, Local not specified - PPO | Source: Ambulatory Visit | Attending: Radiation Oncology | Admitting: Radiation Oncology

## 2022-03-04 ENCOUNTER — Ambulatory Visit: Payer: Federal, State, Local not specified - PPO

## 2022-03-04 ENCOUNTER — Other Ambulatory Visit: Payer: Self-pay

## 2022-03-04 ENCOUNTER — Encounter: Payer: Self-pay | Admitting: *Deleted

## 2022-03-04 DIAGNOSIS — D0511 Intraductal carcinoma in situ of right breast: Secondary | ICD-10-CM

## 2022-03-04 LAB — RAD ONC ARIA SESSION SUMMARY
Course Elapsed Days: 27
Plan Fractions Treated to Date: 4
Plan Prescribed Dose Per Fraction: 2 Gy
Plan Total Fractions Prescribed: 6
Plan Total Prescribed Dose: 12 Gy
Reference Point Dosage Given to Date: 48.05 Gy
Reference Point Session Dosage Given: 2 Gy
Session Number: 19

## 2022-03-05 ENCOUNTER — Ambulatory Visit: Payer: Federal, State, Local not specified - PPO

## 2022-03-05 ENCOUNTER — Other Ambulatory Visit: Payer: Self-pay

## 2022-03-05 ENCOUNTER — Ambulatory Visit
Admission: RE | Admit: 2022-03-05 | Discharge: 2022-03-05 | Disposition: A | Discharge status: Home / Self Care | Payer: Federal, State, Local not specified - PPO | Source: Ambulatory Visit | Attending: Radiation Oncology | Admitting: Radiation Oncology

## 2022-03-05 DIAGNOSIS — D0511 Intraductal carcinoma in situ of right breast: Secondary | ICD-10-CM | POA: Diagnosis not present

## 2022-03-05 LAB — RAD ONC ARIA SESSION SUMMARY
Course Elapsed Days: 28
Plan Fractions Treated to Date: 5
Plan Prescribed Dose Per Fraction: 2 Gy
Plan Total Fractions Prescribed: 6
Plan Total Prescribed Dose: 12 Gy
Reference Point Dosage Given to Date: 50.05 Gy
Reference Point Session Dosage Given: 2 Gy
Session Number: 20

## 2022-03-06 ENCOUNTER — Other Ambulatory Visit: Payer: Self-pay

## 2022-03-06 ENCOUNTER — Encounter: Payer: Self-pay | Admitting: Radiation Oncology

## 2022-03-06 ENCOUNTER — Ambulatory Visit
Admission: RE | Admit: 2022-03-06 | Discharge: 2022-03-06 | Disposition: A | Discharge status: Home / Self Care | Payer: Federal, State, Local not specified - PPO | Source: Ambulatory Visit | Attending: Radiation Oncology | Admitting: Radiation Oncology

## 2022-03-06 DIAGNOSIS — D0511 Intraductal carcinoma in situ of right breast: Secondary | ICD-10-CM | POA: Diagnosis not present

## 2022-03-06 LAB — RAD ONC ARIA SESSION SUMMARY
Course Elapsed Days: 29
Plan Fractions Treated to Date: 6
Plan Prescribed Dose Per Fraction: 2 Gy
Plan Total Fractions Prescribed: 6
Plan Total Prescribed Dose: 12 Gy
Reference Point Dosage Given to Date: 52.05 Gy
Reference Point Session Dosage Given: 2 Gy
Session Number: 21

## 2022-03-25 ENCOUNTER — Encounter: Payer: Self-pay | Admitting: Hematology and Oncology

## 2022-03-25 ENCOUNTER — Other Ambulatory Visit: Payer: Self-pay

## 2022-03-25 ENCOUNTER — Inpatient Hospital Stay
Payer: Federal, State, Local not specified - PPO | Attending: Hematology and Oncology | Admitting: Hematology and Oncology

## 2022-03-25 DIAGNOSIS — Z79899 Other long term (current) drug therapy: Secondary | ICD-10-CM | POA: Diagnosis not present

## 2022-03-25 DIAGNOSIS — Z79811 Long term (current) use of aromatase inhibitors: Secondary | ICD-10-CM | POA: Insufficient documentation

## 2022-03-25 DIAGNOSIS — Z7982 Long term (current) use of aspirin: Secondary | ICD-10-CM | POA: Insufficient documentation

## 2022-03-25 DIAGNOSIS — D0511 Intraductal carcinoma in situ of right breast: Secondary | ICD-10-CM | POA: Diagnosis present

## 2022-03-25 MED ORDER — ANASTROZOLE 1 MG PO TABS: 1.0000 mg | ORAL_TABLET | Freq: Every day | ORAL | 3 refills | Status: DC

## 2022-03-25 NOTE — Assessment & Plan Note (Addendum)
This is a very pleasant 76 year old female patient with right breast DCIS, ER/PR positive status post right breast lumpectomy with reexcision, final pathology showing DCIS with close margin, prior prognostic showed ER/PR positive DCIS s/p adjuvant radiation.  Given her prior episode of blood clot in her eye and blindness in the right eye, she is more interested in aromatase inhibitors. We have once again reviewed the mechanism of action of aromatase inhibitors, adverse effects including but not limited to fatigue, postmenopausal symptoms, arthralgias, bone loss, possible increased risk of cardiovascular events.  She is ready to proceed with antiestrogen therapy as recommended.  Anastrozole dispensed to the pharmacy of her choice.  Diagnostic mammogram ordered for April 2023.  She will return to clinic in 3 months with survivorship clinic and 6 months with me.

## 2022-03-25 NOTE — Progress Notes (Signed)
[place] NOTE  Patient Care Team: Burnard Bunting, MD as PCP - General (Internal Medicine) Rolm Bookbinder, MD as Consulting Physician (General Surgery) Benay Pike, MD as Consulting Physician (Hematology and Oncology) Gery Pray, MD as Consulting Physician (Radiation Oncology) Mauro Kaufmann, RN as Oncology Nurse Navigator Rockwell Germany, RN as Oncology Nurse Navigator  CHIEF COMPLAINTS/PURPOSE OF CONSULTATION:  Newly diagnosed breast cancer, DCIS  HISTORY OF PRESENTING ILLNESS:  Theresa Shannon 76 y.o. female is here because of recent diagnosis of right breast DCIS  I reviewed her records extensively and collaborated the history with the patient.  SUMMARY OF ONCOLOGIC HISTORY: Oncology History  Ductal carcinoma in situ (DCIS) of right breast  10/15/2021 Mammogram   Digital screening bilateral mammogram showed indeterminate calcifications in the right breast.   10/31/2021 Pathology Results   Pathology from the breast biopsy showed low-grade DCIS with calcifications rest of the needle core biopsy showed fibrocystic changes with usual ductal hyperplasia and vascular calcifications ER 100% positive strong staining, PR 95% positive strong staining   11/12/2021 Initial Diagnosis   Ductal carcinoma in situ (DCIS) of right breast   11/14/2021 Cancer Staging   Staging form: Breast, AJCC 8th Edition - Clinical stage from 11/14/2021: Stage 0 (cTis (DCIS), cN0, cM0, ER+, PR+, HER2: Not Assessed) - Signed by Benay Pike, MD on 11/14/2021 Stage prefix: Initial diagnosis Nuclear grade: G1 Laterality: Right Staged by: Pathologist and managing physician Stage used in treatment planning: Yes National guidelines used in treatment planning: Yes Type of national guideline used in treatment planning: NCCN    Genetic Testing   [facility] CustomNext Panel was Negative. Report date is 12/05/2021.  The CustomNext-Cancer+RNAinsight panel offered by [facility]  includes sequencing and rearrangement analysis for the following 52 genes :  APC, ATM, AXIN2, BAP1, BARD1, BMPR1A, BRCA1, BRCA2, BRIP1, CDH1, CDK4, CDKN2A, CHEK2, CTNNA1, DICER1, EPCAM, FH, FLCN, GREM1, HOXB13, KIT, MEN1, MET, MITF, MLH1, MSH2, MSH3, MSH6, MUTYH, NBN, NF1, NTHL1, PALB2, PDGFRA, PMS2, POLD1, POLE, PTEN, RAD50, RAD51C, RAD51D, SDHA, SDHB, SDHC, SDHD, SMAD4, SMARCA4, STK11, TP53, TSC1, TSC2, and VHL.  RNA data is routinely analyzed for use in variant interpretation for all genes.    12/28/2021 Surgery   Right breast lumpectomy initially done on 518 showed DCIS with calcifications partially involving a fibrotic papillary lesion, focally less than 1 mm from final medial margin.  She underwent reexcision and this once again showed low-grade DCIS measuring 1 cm again 1 mm from the final medial margin fibrosis and inflammation consistent with previous lumpectomy. Prior prognostic showed ER 100% positive strong staining and PR 95% positive strong staining    Patient is here for follow-up with her husband.  She is now status post adjuvant radiation and tolerated this really well.  She has some rash in the armpit which she attributes to deodorant since its in both armpits.  She otherwise feels a little tired in her right arm, hoping to get some strength training again.  She wants to try anastrozole, she is worried about the risk of blood clots with tamoxifen.  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   Chronic kidney disease    hypertensive chronic kideny disease benign/ stage 3   Diabetes mellitus, type 2 ([place])  Esophagitis determined by endoscopy 03/2015   GERD (gastroesophageal reflux disease)    Gout 04/2015   L toe   History of frequent urinary tract infections    History of hiatal hernia    History  of kidney stones    HTN (hypertension)    Hyperlipemia    Hypertension    Hypothyroidism    Peripheral neuropathy    Peripheral vascular disease (HCC)    Spondylolisthesis of lumbar region    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; right eye    Thyroid goiter    multinodular    Wears glasses     SURGICAL HISTORY: Past Surgical History:  Procedure Laterality Date   ABDOMINAL HYSTERECTOMY     ABSCESS DRAINAGE  07/08/1993   breast   ANTERIOR LAT LUMBAR FUSION N/A 02/02/2018   Procedure: Anterolateral decompression Lumbar Four-Five with xlif percutaneous fixation with robotic placement of screws Lumbar four-five;  Surgeon: Kristeen Miss, MD;  Location: [facility];  Service: Neurosurgery;  Laterality: N/A;   APPENDECTOMY     APPLICATION OF ROBOTIC ASSISTANCE FOR SPINAL PROCEDURE N/A 02/02/2018   Procedure: APPLICATION OF ROBOTIC ASSISTANCE FOR SPINAL PROCEDURE;  Surgeon: Kristeen Miss, MD;  Location: [facility];  Service: Neurosurgery;  Laterality: N/A;   BACK SURGERY     BREAST BIOPSY     BREAST CYST EXCISION  02/26/2012   Procedure: CYST EXCISION BREAST;  Surgeon: Harl Bowie, MD;  Location: [facility];  Service: General;  Laterality: Left;  excision chronic cyst left breast   BREAST LUMPECTOMY WITH RADIOACTIVE SEED LOCALIZATION Right 11/22/2021   Procedure: RIGHT BREAST LUMPECTOMY WITH RADIOACTIVE SEED LOCALIZATION;  Surgeon: Rolm Bookbinder, MD;  Location: [facility];  Service: General;  Laterality: Right;   BUNIONECTOMY     LEFT   CHOLECYSTECTOMY N/A 08/17/2015   Procedure: LAPAROSCOPIC CHOLECYSTECTOMY WITH INTRAOPERATIVE CHOLANGIOGRAM;  Surgeon: Armandina Gemma, MD;  Location: [facility];  Service: General;  Laterality: N/A;   DILATION AND CURETTAGE OF UTERUS     KNEE SURGERY     BILATERAL   LUMBAR PERCUTANEOUS PEDICLE SCREW 1 LEVEL N/A 02/02/2018   Procedure: LUMBAR PERCUTANEOUS PEDICLE SCREW LUMBAR FOUR-FIVE;  Surgeon: Kristeen Miss, MD;  Location: [facility];  Service: Neurosurgery;   Laterality: N/A;   RE-EXCISION OF BREAST LUMPECTOMY Right 12/28/2021   Procedure: RE-EXCISION OF RIGHT BREAST MARGIN;  Surgeon: Rolm Bookbinder, MD;  Location: [facility];  Service: General;  Laterality: Right;   TEMPORAL ARTERY BIOPSY / LIGATION     TONSILLECTOMY AND ADENOIDECTOMY     TUBAL LIGATION     TUMOR REMOVAL     LEFT ARM-lipoma    SOCIAL HISTORY: Social History   Socioeconomic History   Marital status: Married    Spouse name: Elenore Rota   Number of children: 1   Years of education: 12th   Highest education level: Not on file  Occupational History   Occupation: Retired  Tobacco Use   Smoking status: Never   Smokeless tobacco: Never  Vaping Use   Vaping Use: Never used  Substance and Sexual Activity   Alcohol use: No   Drug use: No   Sexual activity: Not on file  Other Topics Concern   Not on file  Social History Narrative   Patient lives at home with husband Elenore Rota.    Patient has 1 child.    Patient is retired.    Patient has a high school education.    Social Determinants of Health   Financial  Resource Strain: Low Risk  (11/14/2021)   Overall Financial Resource Strain (CARDIA)    Difficulty of Paying Living Expenses: Not very hard  Food Insecurity: No Food Insecurity (11/14/2021)   Hunger Vital Sign    Worried About Running Out of Food in the Last Year: Never true    Ran Out of Food in the Last Year: Never true  Transportation Needs: No Transportation Needs (11/14/2021)   PRAPARE - Hydrologist (Medical): No    Lack of Transportation (Non-Medical): No  Physical Activity: Not on file  Stress: Not on file  Social Connections: Not on file  Intimate Partner Violence: Not on file    FAMILY HISTORY: Family History  Problem Relation Age of Onset   Dementia Mother    Kidney disease Mother    Cancer Mother    Diabetes Mother    Breast cancer Mother 61   Dementia Father    Heart disease Father    Hypertension Father    Stroke  Father    Breast cancer Sister 38   Kidney cancer Sister 61   Leukemia Maternal Aunt    Pancreatic cancer Paternal Aunt     ALLERGIES:  is allergic to amlodipine, oxycodone-acetaminophen, and other.  MEDICATIONS:  Current Outpatient Medications  Medication Sig Dispense Refill   anastrozole (ARIMIDEX) 1 MG tablet Take 1 tablet (1 mg total) by mouth daily. 90 tablet 3   acetaminophen (TYLENOL) 325 MG tablet Take 650 mg by mouth every 6 (six) hours as needed for mild pain.     aspirin 81 MG chewable tablet Chew 81 mg by mouth daily.     atenolol (TENORMIN) 50 MG tablet Take 1 tablet (50 mg total) by mouth daily. 90 tablet 4   cholecalciferol (VITAMIN D) 1000 units tablet Take 1,000 Units by mouth daily.     fenofibrate (TRICOR) 145 MG tablet Take 1 tablet (145 mg total) by mouth daily. Please make overdue yearly appt with Dr. Acie Fredrickson before anymore refills. 2nd attempt 15 tablet 0   furosemide (LASIX) 40 MG tablet Take 1 tablet (40 mg total) by mouth daily. (Patient not taking: Reported on 12/14/2021) 7 tablet 0   levothyroxine (SYNTHROID, LEVOTHROID) 175 MCG tablet Take 175 mcg by mouth daily before breakfast.     metFORMIN (GLUCOPHAGE) 500 MG tablet Take 1,000 mg by mouth 2 (two) times daily with a meal.     Multiple Vitamin (MULTIVITAMIN WITH MINERALS) TABS tablet Take 1 tablet by mouth daily.     olmesartan-hydrochlorothiazide (BENICAR HCT) 40-25 MG tablet Take 1 tablet by mouth daily.  3   No current facility-administered medications for this visit.    REVIEW OF SYSTEMS:   Constitutional: Denies fevers, chills or abnormal night sweats Eyes: Denies blurriness of vision, double vision or watery eyes Ears, nose, mouth, throat, and face: Denies mucositis or sore throat Respiratory: Denies cough, dyspnea or wheezes Cardiovascular: Denies palpitation, chest discomfort or lower extremity swelling Gastrointestinal:  Denies nausea, heartburn or change in bowel habits Skin: Denies abnormal  skin rashes Lymphatics: Denies new lymphadenopathy or easy bruising Neurological:Denies numbness, tingling or new weaknesses Behavioral/Psych: Mood is stable, no new changes  Breast: Denies any palpable lumps or discharge All other systems were reviewed with the patient and are negative.  PHYSICAL EXAMINATION: ECOG PERFORMANCE STATUS: 0 - Asymptomatic  Vitals:   03/25/22 0917  BP: (!) 148/56  Pulse: (!) 57  Resp: 16  Temp: 98.1 F (36.7 C)  SpO2: 96%  Filed Weights   03/25/22 0917  Weight: 175 lb 12.8 oz (79.7 kg)    LABORATORY DATA:  I have reviewed the data as listed Lab Results  Component Value Date   WBC 5.0 12/19/2021   HGB 10.6 (L) 12/19/2021   HCT 34.9 (L) 12/19/2021   MCV 95.1 12/19/2021   PLT 199 12/19/2021   Lab Results  Component Value Date   NA 140 12/19/2021   K 5.1 12/19/2021   CL 111 12/19/2021   CO2 19 (L) 12/19/2021   Physical Exam Constitutional:      Appearance: Normal appearance.  Chest:     Comments: Right breast with minimal erythema from radiation.  Neurological:     Mental Status: She is alert.      RADIOGRAPHIC STUDIES: I have personally reviewed the radiological reports and agreed with the findings in the report.  ASSESSMENT AND PLAN:   Ductal carcinoma in situ (DCIS) of right breast This is a very pleasant 76 year old female patient with right breast DCIS, ER/PR positive status post right breast lumpectomy with reexcision, final pathology showing DCIS with close margin, prior prognostic showed ER/PR positive DCIS s/p adjuvant radiation.  Given her prior episode of blood clot in her eye and blindness in the right eye, she is more interested in aromatase inhibitors. We have once again reviewed the mechanism of action of aromatase inhibitors, adverse effects including but not limited to fatigue, postmenopausal symptoms, arthralgias, bone loss, possible increased risk of cardiovascular events.  She is ready to proceed with  antiestrogen therapy as recommended.  Anastrozole dispensed to the pharmacy of her choice.  Diagnostic mammogram ordered for April 2023.  She will return to clinic in 3 months with survivorship clinic and 6 months with me.   Total time spent: 30 minutes. All questions were answered. The patient knows to call the clinic with any problems, questions or concerns.    Benay Pike, MD 03/25/22

## 2022-04-03 NOTE — Progress Notes (Signed)
Radiation Oncology         [phone] ________________________________  Name: Theresa Shannon MRN: [id number]  Date: 04/04/2022  DOB: 1945-12-15  Follow-Up Visit Note  CC: Burnard Bunting, MD  Burnard Bunting, MD  No diagnosis found.  Diagnosis: Stage 0 (cTis (DCIS), cN0, cM0) Right Breast, Low-grade DCIS, ER+ / PR+ / Her2 not assessed  Interval Since Last Radiation: About 1 month  Intent: Curative  Radiation Treatment Dates: 02/05/2022 through 03/06/2022 Site Technique Total Dose (Gy) Dose per Fx (Gy) Completed Fx Beam Energies  Breast, Right: Breast_R 3D 40.05/40.05 2.67 15/15 6XFFF  Breast, Right: Breast_R_Bst 3D 12/12 2 6/6 6X, 10X    Narrative:  The patient returns today for routine follow-up. The patient tolerated radiation therapy relatively well. During her final weekly treatment check on 03/05/22, the patient reported fatigue, skin redness, and right breast swelling. Physical exam performed on that same date revealed some erythema and hyperpigmentation changes to the right breast area. Hyperpigmentation changes were most notable in the inframammary fold area. No skin breakdown was appreciated.       Since completing XRT, the patient followed up with Dr. Chryl Heck on 03/25/22. During which time, the patient endorsed some weakness in her right arm.  Following discussion of the risks and benefits, the patient agreed to proceed with antiestrogen therapy consisting of anastrozole.                Otherwise, no significant interval history since the patient was last seen.   ***            Allergies:  is allergic to amlodipine, oxycodone-acetaminophen, and other.  Meds: Current Outpatient Medications  Medication Sig Dispense Refill   acetaminophen (TYLENOL) 325 MG tablet Take 650 mg by mouth every 6 (six) hours as needed for mild pain.     anastrozole (ARIMIDEX) 1 MG tablet Take 1 tablet (1 mg total) by mouth daily. 90 tablet 3   aspirin 81 MG chewable tablet Chew 81 mg by  mouth daily.     atenolol (TENORMIN) 50 MG tablet Take 1 tablet (50 mg total) by mouth daily. 90 tablet 4   cholecalciferol (VITAMIN D) 1000 units tablet Take 1,000 Units by mouth daily.     fenofibrate (TRICOR) 145 MG tablet Take 1 tablet (145 mg total) by mouth daily. Please make overdue yearly appt with Dr. Acie Fredrickson before anymore refills. 2nd attempt 15 tablet 0   furosemide (LASIX) 40 MG tablet Take 1 tablet (40 mg total) by mouth daily. (Patient not taking: Reported on 12/14/2021) 7 tablet 0   levothyroxine (SYNTHROID, LEVOTHROID) 175 MCG tablet Take 175 mcg by mouth daily before breakfast.     metFORMIN (GLUCOPHAGE) 500 MG tablet Take 1,000 mg by mouth 2 (two) times daily with a meal.     Multiple Vitamin (MULTIVITAMIN WITH MINERALS) TABS tablet Take 1 tablet by mouth daily.     olmesartan-hydrochlorothiazide (BENICAR HCT) 40-25 MG tablet Take 1 tablet by mouth daily.  3   No current facility-administered medications for this encounter.    Physical Findings: The patient is in no acute distress. Patient is alert and oriented.  vitals were not taken for this visit. .  No significant changes. Lungs are clear to auscultation bilaterally. Heart has regular rate and rhythm. No palpable cervical, supraclavicular, or axillary adenopathy. Abdomen soft, non-tender, normal bowel sounds.  Left Breast: no palpable mass, nipple discharge or bleeding. Right Breast: ***   Lab Findings: Lab Results  Component Value Date  WBC 5.0 12/19/2021   HGB 10.6 (L) 12/19/2021   HCT 34.9 (L) 12/19/2021   MCV 95.1 12/19/2021   PLT 199 12/19/2021    Radiographic Findings: No results found.  Impression:  Stage 0 (cTis (DCIS), cN0, cM0) Right Breast, Low-grade DCIS, ER+ / PR+ / Her2 not assessed  The patient is recovering from the effects of radiation.  ***  Plan:  ***   *** minutes of total time was spent for this patient encounter, including preparation, face-to-face counseling with the patient and  coordination of care, physical exam, and documentation of the encounter. ____________________________________  Blair Promise, PhD, MD  This document serves as a record of services personally performed by Gery Pray, MD. It was created on his behalf by Roney Mans, a trained medical scribe. The creation of this record is based on the scribe's personal observations and the provider's statements to them. This document has been checked and approved by the attending provider.

## 2022-04-03 NOTE — Progress Notes (Incomplete)
  Radiation Oncology         [phone] ________________________________  Patient Name: Theresa Shannon MRN: [id number] DOB: 05-22-1946 Referring Physician: Burnard Bunting (Profile Not Attached) Date of Service: 03/06/2022 [facility], [place]                                                        End Of Treatment Note  Diagnoses: D05.11-Intraductal carcinoma in situ of right breast  Cancer Staging: Stage 0 (cTis (DCIS), cN0, cM0) Right Breast, Low-grade DCIS, ER+ / PR+ / Her2 not assessed  Intent: Curative  Radiation Treatment Dates: 02/05/2022 through 03/06/2022 Site Technique Total Dose (Gy) Dose per Fx (Gy) Completed Fx Beam Energies  Breast, Right: Breast_R 3D 40.05/40.05 2.67 15/15 6XFFF  Breast, Right: Breast_R_Bst 3D 12/12 2 6/6 6X, 10X   Narrative: The patient tolerated radiation therapy relatively well. During her final weekly treatment check on 03/05/22, the patient reported fatigue, skin redness, and right breast swelling. Physical exam performed on that same date revealed some erythema and hyperpigmentation changes to the right breast area. Hyperpigmentation changes were most notable in the inframammary fold area. No skin breakdown was appreciated.   Plan: The patient will follow-up with radiation oncology in one month .  ________________________________________________ -----------------------------------  Blair Promise, PhD, MD  This document serves as a record of services personally performed by Gery Pray, MD. It was created on his behalf by Roney Mans, a trained medical scribe. The creation of this record is based on the scribe's personal observations and the provider's statements to them. This document has been checked and approved by the attending provider.

## 2022-04-04 ENCOUNTER — Ambulatory Visit
Admission: RE | Admit: 2022-04-04 | Discharge: 2022-04-04 | Disposition: A | Discharge status: Home / Self Care | Payer: Federal, State, Local not specified - PPO | Source: Ambulatory Visit | Attending: Radiation Oncology | Admitting: Radiation Oncology

## 2022-04-04 ENCOUNTER — Encounter: Payer: Self-pay | Admitting: Radiation Oncology

## 2022-04-04 DIAGNOSIS — Z923 Personal history of irradiation: Secondary | ICD-10-CM | POA: Diagnosis not present

## 2022-04-04 DIAGNOSIS — Z79811 Long term (current) use of aromatase inhibitors: Secondary | ICD-10-CM | POA: Diagnosis not present

## 2022-04-04 DIAGNOSIS — D0511 Intraductal carcinoma in situ of right breast: Secondary | ICD-10-CM | POA: Insufficient documentation

## 2022-04-04 DIAGNOSIS — Z79899 Other long term (current) drug therapy: Secondary | ICD-10-CM | POA: Insufficient documentation

## 2022-04-04 DIAGNOSIS — Z170 Estrogen receptor positive status [ER+]: Secondary | ICD-10-CM | POA: Insufficient documentation

## 2022-04-04 DIAGNOSIS — Z7982 Long term (current) use of aspirin: Secondary | ICD-10-CM | POA: Insufficient documentation

## 2022-04-04 NOTE — Progress Notes (Signed)
Theresa Shannon is here today for follow up post radiation to the breast.   Breast Side:Right   They completed their radiation on: 03/06/22   Does the patient complain of any of the following: Post radiation skin issues:  Continues to have redness to right breast.  Breast Tenderness: Yes, to incision site.  Breast Swelling: No  Lymphadema: No Range of Motion limitations: Soreness to right arm when lifting.  Fatigue post radiation: Continues to have low appetite.  Appetite good/fair/poor: good  Additional comments if applicable:   BP (!) 450/38   Pulse 63   Temp 97.9 F (36.6 C)   Resp 18   Ht '5\' 3"'$  (1.6 m)   Wt 175 lb 9.6 oz (79.7 kg)   SpO2 98%   BMI 31.11 kg/m

## 2022-06-24 ENCOUNTER — Inpatient Hospital Stay: Payer: Federal, State, Local not specified - PPO | Attending: Hematology and Oncology | Admitting: Adult Health

## 2022-06-24 VITALS — BP 137/50 | HR 52 | Temp 98.2°F | Resp 18 | Ht 63.0 in | Wt 174.0 lb

## 2022-06-24 DIAGNOSIS — Z803 Family history of malignant neoplasm of breast: Secondary | ICD-10-CM | POA: Insufficient documentation

## 2022-06-24 DIAGNOSIS — I129 Hypertensive chronic kidney disease with stage 1 through stage 4 chronic kidney disease, or unspecified chronic kidney disease: Secondary | ICD-10-CM | POA: Diagnosis not present

## 2022-06-24 DIAGNOSIS — Z79811 Long term (current) use of aromatase inhibitors: Secondary | ICD-10-CM | POA: Insufficient documentation

## 2022-06-24 DIAGNOSIS — D0511 Intraductal carcinoma in situ of right breast: Secondary | ICD-10-CM | POA: Diagnosis present

## 2022-06-24 DIAGNOSIS — Z87442 Personal history of urinary calculi: Secondary | ICD-10-CM | POA: Diagnosis not present

## 2022-06-24 DIAGNOSIS — E1122 Type 2 diabetes mellitus with diabetic chronic kidney disease: Secondary | ICD-10-CM | POA: Insufficient documentation

## 2022-06-24 DIAGNOSIS — Z79899 Other long term (current) drug therapy: Secondary | ICD-10-CM | POA: Insufficient documentation

## 2022-06-24 DIAGNOSIS — E785 Hyperlipidemia, unspecified: Secondary | ICD-10-CM | POA: Diagnosis not present

## 2022-06-24 DIAGNOSIS — Z8673 Personal history of transient ischemic attack (TIA), and cerebral infarction without residual deficits: Secondary | ICD-10-CM | POA: Insufficient documentation

## 2022-06-24 DIAGNOSIS — Z7982 Long term (current) use of aspirin: Secondary | ICD-10-CM | POA: Diagnosis not present

## 2022-06-24 DIAGNOSIS — N189 Chronic kidney disease, unspecified: Secondary | ICD-10-CM | POA: Insufficient documentation

## 2022-06-24 DIAGNOSIS — Z170 Estrogen receptor positive status [ER+]: Secondary | ICD-10-CM | POA: Insufficient documentation

## 2022-06-24 DIAGNOSIS — E039 Hypothyroidism, unspecified: Secondary | ICD-10-CM | POA: Insufficient documentation

## 2022-06-24 DIAGNOSIS — N1832 Chronic kidney disease, stage 3b: Secondary | ICD-10-CM | POA: Insufficient documentation

## 2022-06-24 DIAGNOSIS — Z7984 Long term (current) use of oral hypoglycemic drugs: Secondary | ICD-10-CM | POA: Diagnosis not present

## 2022-06-24 NOTE — Progress Notes (Signed)
SURVIVORSHIP VISIT:  BRIEF ONCOLOGIC HISTORY:  Oncology History  Ductal carcinoma in situ (DCIS) of right breast  10/15/2021 Mammogram   Digital screening bilateral mammogram showed indeterminate calcifications in the right breast.   10/31/2021 Pathology Results   Pathology from the breast biopsy showed low-grade DCIS with calcifications rest of the needle core biopsy showed fibrocystic changes with usual ductal hyperplasia and vascular calcifications ER 100% positive strong staining, PR 95% positive strong staining   11/12/2021 Initial Diagnosis   Ductal carcinoma in situ (DCIS) of right breast   11/14/2021 Cancer Staging   Staging form: Breast, AJCC 8th Edition - Clinical stage from 11/14/2021: Stage 0 (cTis (DCIS), cN0, cM0, ER+, PR+, HER2: Not Assessed) - Signed by Benay Pike, MD on 11/14/2021 Stage prefix: Initial diagnosis Nuclear grade: G1 Laterality: Right Staged by: Pathologist and managing physician Stage used in treatment planning: Yes National guidelines used in treatment planning: Yes Type of national guideline used in treatment planning: NCCN    Genetic Testing   [facility] CustomNext Panel was Negative. Report date is 12/05/2021.  The CustomNext-Cancer+RNAinsight panel offered by [facility] includes sequencing and rearrangement analysis for the following 52 genes :  APC, ATM, AXIN2, BAP1, BARD1, BMPR1A, BRCA1, BRCA2, BRIP1, CDH1, CDK4, CDKN2A, CHEK2, CTNNA1, DICER1, EPCAM, FH, FLCN, GREM1, HOXB13, KIT, MEN1, MET, MITF, MLH1, MSH2, MSH3, MSH6, MUTYH, NBN, NF1, NTHL1, PALB2, PDGFRA, PMS2, POLD1, POLE, PTEN, RAD50, RAD51C, RAD51D, SDHA, SDHB, SDHC, SDHD, SMAD4, SMARCA4, STK11, TP53, TSC1, TSC2, and VHL.  RNA data is routinely analyzed for use in variant interpretation for all genes.    12/28/2021 Surgery   Right breast lumpectomy initially done on 518 showed DCIS with calcifications partially involving a fibrotic papillary lesion, focally less than 1 mm from final medial margin.   She underwent reexcision and this once again showed low-grade DCIS measuring 1 cm again 1 mm from the final medial margin fibrosis and inflammation consistent with previous lumpectomy. Prior prognostic showed ER 100% positive strong staining and PR 95% positive strong staining   02/05/2022 - 03/06/2022 Radiation Therapy   Site Technique Total Dose (Gy) Dose per Fx (Gy) Completed Fx Beam Energies  Breast, Right: Breast_R 3D 40.05/40.05 2.67 15/15 6XFFF  Breast, Right: Breast_R_Bst 3D 12/12 2 6/6 6X, 10X     03/2022 -  Anti-estrogen oral therapy   Anastrozole     INTERVAL HISTORY:  Theresa Shannon to review her survivorship care plan detailing her treatment course for breast cancer, as well as monitoring long-term side effects of that treatment, education regarding health maintenance, screening, and overall wellness and health promotion.     Overall, Theresa Shannon reports feeling quite well.  She is taking Anastrozole daily and tolerates it moderately well.    REVIEW OF SYSTEMS:  Review of Systems  Constitutional:  Negative for appetite change, chills, fatigue, fever and unexpected weight change.  HENT:   Negative for hearing loss, lump/mass and trouble swallowing.   Eyes:  Negative for eye problems and icterus.  Respiratory:  Negative for chest tightness, cough and shortness of breath.   Cardiovascular:  Negative for chest pain, leg swelling and palpitations.  Gastrointestinal:  Negative for abdominal distention, abdominal pain, constipation, diarrhea, nausea and vomiting.  Endocrine: Negative for hot flashes.  Genitourinary:  Negative for difficulty urinating.   Musculoskeletal:  Negative for arthralgias.  Skin:  Negative for itching and rash.  Neurological:  Negative for dizziness, extremity weakness, headaches and numbness.  Hematological:  Negative for adenopathy. Does not bruise/bleed easily.  Psychiatric/Behavioral:  Negative for depression. The patient is not nervous/anxious.    Breast: Denies any new nodularity, masses, tenderness, nipple changes, or nipple discharge.     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   Chronic kidney disease    hypertensive chronic kideny disease benign/ stage 3   Diabetes mellitus, type 2 (HCC)    Esophagitis determined by endoscopy 03/2015   GERD (gastroesophageal reflux disease)    Gout 04/2015   L toe   History of frequent urinary tract infections    History of hiatal hernia    History of kidney stones    HTN (hypertension)    Hyperlipemia    Hypertension    Hypothyroidism    Peripheral neuropathy    Peripheral vascular disease (HCC)    Spondylolisthesis of lumbar region    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; right eye    Thyroid goiter    multinodular    Wears glasses    Past Surgical History:  Procedure Laterality Date   ABDOMINAL HYSTERECTOMY     ABSCESS DRAINAGE  07/08/1993   breast   ANTERIOR LAT LUMBAR FUSION N/A 02/02/2018   Procedure: Anterolateral decompression Lumbar Four-Five with xlif percutaneous fixation with robotic placement of screws Lumbar four-five;  Surgeon: Kristeen Miss, MD;  Location: [place];  Service: Neurosurgery;  Laterality: N/A;   APPENDECTOMY     APPLICATION OF ROBOTIC ASSISTANCE FOR SPINAL PROCEDURE N/A 02/02/2018   Procedure: APPLICATION OF ROBOTIC ASSISTANCE FOR SPINAL PROCEDURE;  Surgeon: Kristeen Miss, MD;  Location: [place];  Service: Neurosurgery;  Laterality: N/A;   BACK SURGERY     BREAST BIOPSY     BREAST CYST EXCISION  02/26/2012   Procedure: CYST EXCISION BREAST;  Surgeon: Harl Bowie, MD;  Location: [place];  Service: General;  Laterality: Left;  excision chronic cyst left breast   BREAST LUMPECTOMY WITH RADIOACTIVE SEED LOCALIZATION Right 11/22/2021   Procedure: RIGHT BREAST LUMPECTOMY WITH RADIOACTIVE SEED  LOCALIZATION;  Surgeon: Rolm Bookbinder, MD;  Location: [place];  Service: General;  Laterality: Right;   BUNIONECTOMY     LEFT   CHOLECYSTECTOMY N/A 08/17/2015   Procedure: LAPAROSCOPIC CHOLECYSTECTOMY WITH INTRAOPERATIVE CHOLANGIOGRAM;  Surgeon: Armandina Gemma, MD;  Location: [facility];  Service: General;  Laterality: N/A;   DILATION AND CURETTAGE OF UTERUS     KNEE SURGERY     BILATERAL   LUMBAR PERCUTANEOUS PEDICLE SCREW 1 LEVEL N/A 02/02/2018   Procedure: LUMBAR PERCUTANEOUS PEDICLE SCREW LUMBAR FOUR-FIVE;  Surgeon: Kristeen Miss, MD;  Location: [place];  Service: Neurosurgery;  Laterality: N/A;   RE-EXCISION OF BREAST LUMPECTOMY Right 12/28/2021   Procedure: RE-EXCISION OF RIGHT BREAST MARGIN;  Surgeon: Rolm Bookbinder, MD;  Location: [facility];  Service: General;  Laterality: Right;   TEMPORAL ARTERY BIOPSY / LIGATION     TONSILLECTOMY AND ADENOIDECTOMY     TUBAL LIGATION     TUMOR REMOVAL     LEFT ARM-lipoma     ALLERGIES:  Allergies  Allergen Reactions   Amlodipine Swelling and Rash   Oxycodone-Acetaminophen Other (See Comments)    REACTION: Syncope   Oxaprozin Other (See Comments)   Tyloxapol Other (See Comments)   Other Rash    Band-aid adhesive     CURRENT MEDICATIONS:  Outpatient Encounter Medications as of 06/24/2022  Medication Sig Note   JARDIANCE 25 MG TABS tablet Take 25 mg by mouth daily.    levothyroxine (SYNTHROID) 150 MCG tablet Take 150 mcg by mouth every morning.    metFORMIN (GLUCOPHAGE-XR) 500 MG 24 hr tablet Take 500 mg by mouth 2 (two) times daily.    acetaminophen (TYLENOL) 325 MG tablet Take 650 mg by mouth every 6 (six) hours as needed for mild pain.    anastrozole (ARIMIDEX) 1 MG tablet Take 1 tablet (1 mg total) by mouth daily.    aspirin 81 MG chewable tablet Chew 81 mg by mouth daily.    atenolol (TENORMIN) 50 MG tablet Take 1 tablet (50 mg total) by mouth daily.    cholecalciferol (VITAMIN D) 1000 units tablet Take 1,000 Units by mouth daily.     fenofibrate (TRICOR) 145 MG tablet Take 1 tablet (145 mg total) by mouth daily. Please make overdue yearly appt with Dr. Acie Fredrickson before anymore refills. 2nd attempt    Multiple Vitamin (MULTIVITAMIN WITH MINERALS) TABS tablet Take 1 tablet by mouth daily.    olmesartan-hydrochlorothiazide (BENICAR HCT) 40-25 MG tablet Take 1 tablet by mouth daily.    [DISCONTINUED] furosemide (LASIX) 40 MG tablet Take 1 tablet (40 mg total) by mouth daily. (Patient not taking: Reported on 12/14/2021) 12/14/2021: Finished on 12/14/2021   [DISCONTINUED] levothyroxine (SYNTHROID, LEVOTHROID) 175 MCG tablet Take 175 mcg by mouth daily before breakfast.    [DISCONTINUED] metFORMIN (GLUCOPHAGE) 500 MG tablet Take 1,000 mg by mouth 2 (two) times daily with a meal.    No facility-administered encounter medications on file as of 06/24/2022.     ONCOLOGIC FAMILY HISTORY:  Family History  Problem Relation Age of Onset   Dementia Mother    Kidney disease Mother    Cancer Mother    Diabetes Mother    Breast cancer Mother 65   Dementia Father    Heart disease Father    Hypertension Father    Stroke Father    Breast cancer Sister 63   Kidney cancer Sister 93   Leukemia Maternal Aunt    Pancreatic cancer Paternal Aunt       SOCIAL HISTORY:  Social History   Socioeconomic History   Marital status: Married    Spouse name: Elenore Rota   Number of children: 1   Years of education: 12th   Highest education level: Not on file  Occupational History   Occupation: Retired  Tobacco Use   Smoking status: Never   Smokeless tobacco: Never  Vaping Use   Vaping Use: Never used  Substance and Sexual Activity   Alcohol use: No   Drug use: No   Sexual activity: Not on file  Other Topics Concern   Not on file  Social History Narrative   Patient lives at home with husband Elenore Rota.    Patient has 1 child.    Patient is retired.    Patient has a high school education.    Social Determinants of Health   Financial Resource  Strain: Low Risk  (11/14/2021)   Overall Financial Resource Strain (CARDIA)    Difficulty of Paying Living Expenses: Not very hard  Food Insecurity: No Food Insecurity (11/14/2021)   Hunger Vital Sign    Worried About Running Out of Food in the Last Year: Never true    Ran Out of Food in the Last Year: Never true  Transportation Needs: No Transportation Needs (11/14/2021)   PRAPARE - Hydrologist (Medical): No  Lack of Transportation (Non-Medical): No  Physical Activity: Not on file  Stress: Not on file  Social Connections: Not on file  Intimate Partner Violence: Not on file     OBSERVATIONS/OBJECTIVE:  BP (!) 137/50 (BP Location: Left Arm, Patient Position: Sitting)   Pulse (!) 52   Temp 98.2 F (36.8 C) (Tympanic)   Resp 18   Ht _0  (1.6 m)   Wt 174 lb (78.9 kg)   SpO2 100%   BMI 30.82 kg/m  GENERAL: Patient is a well appearing female in no acute distress HEENT:  Sclerae anicteric.  Oropharynx clear and moist. No ulcerations or evidence of oropharyngeal candidiasis. Neck is supple.  NODES:  No cervical, supraclavicular, or axillary lymphadenopathy palpated.  BREAST EXAM:  Right breast s/p lumpectomy and radiation, no sign of local recurrence, left breast benign LUNGS:  Clear to auscultation bilaterally.  No wheezes or rhonchi. HEART:  Regular rate and rhythm. No murmur appreciated. ABDOMEN:  Soft, nontender.  Positive, normoactive bowel sounds. No organomegaly palpated. MSK:  No focal spinal tenderness to palpation. Full range of motion bilaterally in the upper extremities. EXTREMITIES:  No peripheral edema.   SKIN:  Clear with no obvious rashes or skin changes. No nail dyscrasia. NEURO:  Nonfocal. Well oriented.  Appropriate affect.  LABORATORY DATA:  None for this visit.  DIAGNOSTIC IMAGING:  None for this visit.      ASSESSMENT AND PLAN:  Theresa Shannon is a pleasant 76 y.o. female with Stage 0 right breast DCIS, ER+/PR+, diagnosed in  11/2021, treated with lumpectomy, adjuvant radiation therapy, and anti-estrogen therapy with Anastrozole beginning in 03/2022.  She presents to the Survivorship Clinic for our initial meeting and routine follow-up post-completion of treatment for breast cancer.    1. Stage 0 right breast cancer:  Theresa Shannon is continuing to recover from definitive treatment for breast cancer. She will follow-up with her medical oncologist, Dr. Chryl Heck in 09/2022 with history and physical exam per surveillance protocol.  She will continue her anti-estrogen therapy with Anastrozole. Thus far, she is tolerating the Anastrozole well, with minimal side effects. She was instructed to make Dr. Lindi Adie or myself aware if she begins to experience any worsening side effects of the medication and I could see her back in clinic to help manage those side effects, as needed. Her mammogram is due 10/2022; orders placed today. Today, a comprehensive survivorship care plan and treatment summary was reviewed with the patient today detailing her breast cancer diagnosis, treatment course, potential late/long-term effects of treatment, appropriate follow-up care with recommendations for the future, and patient education resources.  A copy of this summary, along with a letter will be sent to the patient's primary care provider via mail/fax/In Basket message after today's visit.    2. Bone health:  Given Theresa Shannon's age/history of breast cancer and her current treatment regimen including anti-estrogen therapy with Anastrozole, she is at risk for bone demineralization.  Her last DEXA scan was completed at Dr. Jacquiline Doe office and we are in the process of obtaining records.  We discussed the importance of following her bone density testing every 2 years while she is taking aromatase inhibitor therapy. She was given education on specific activities to promote bone health.  3. Cancer screening:  Due to Theresa Shannon's history and her age, she should  receive screening for skin cancers, colon cancer.  The information and recommendations are listed on the patient's comprehensive care plan/treatment summary and were reviewed in detail with the patient.  4. Health maintenance and wellness promotion: Theresa Shannon was encouraged to consume 5-7 servings of fruits and vegetables per day. We reviewed the "Nutrition Rainbow" handout.  She was also encouraged to engage in moderate to vigorous exercise for 30 minutes per day most days of the week. We discussed the [employer] fitness program, which is designed for cancer survivors to help them become more physically fit after cancer treatments.  She was instructed to limit her alcohol consumption and continue to abstain from tobacco use.     5. Support services/counseling: It is not uncommon for this period of the patient's cancer care trajectory to be one of many emotions and stressors. She was given information regarding our available services and encouraged to contact me with any questions or for help enrolling in any of our support group/programs.    Follow up instructions:    -Return to cancer center in 09/2022 for f/u with Dr. Chryl Heck  -Mammogram due in 10/2022 -She is welcome to return back to the Survivorship Clinic at any time; no additional follow-up needed at this time.  -Consider referral back to survivorship as a long-term survivor for continued surveillance  The patient was provided an opportunity to ask questions and all were answered. The patient agreed with the plan and demonstrated an understanding of the instructions.   Total encounter time:40 minutes*in face-to-face visit time, chart review, lab review, care coordination, order entry, and documentation of the encounter time.    Wilber Bihari, NP 06/24/22 7:19 PM Medical Oncology and Hematology [facility] [address], [place] [address] Tel. [phone]    Fax. [phone]  *Total Encounter Time as  defined by the Centers for Medicare and Medicaid Services includes, in addition to the face-to-face time of a patient visit (documented in the note above) non-face-to-face time: obtaining and reviewing outside history, ordering and reviewing medications, tests or procedures, care coordination (communications with other health care professionals or caregivers) and documentation in the medical record.

## 2022-06-25 ENCOUNTER — Telehealth: Payer: Self-pay

## 2022-06-25 NOTE — Telephone Encounter (Signed)
Placed call to Dr Jacquiline Doe office at [facility] to request vaccination hx as well as Bone density test. Requested for them to be faxed to provided fax number.

## 2022-09-23 ENCOUNTER — Inpatient Hospital Stay
Payer: Federal, State, Local not specified - PPO | Attending: Hematology and Oncology | Admitting: Hematology and Oncology

## 2022-09-23 ENCOUNTER — Other Ambulatory Visit: Payer: Self-pay

## 2022-09-23 ENCOUNTER — Encounter: Payer: Self-pay | Admitting: Hematology and Oncology

## 2022-09-23 VITALS — BP 167/58 | HR 51 | Temp 98.1°F | Resp 16 | Ht 63.0 in | Wt 174.0 lb

## 2022-09-23 DIAGNOSIS — D0511 Intraductal carcinoma in situ of right breast: Secondary | ICD-10-CM | POA: Diagnosis present

## 2022-09-23 DIAGNOSIS — Z8051 Family history of malignant neoplasm of kidney: Secondary | ICD-10-CM | POA: Diagnosis not present

## 2022-09-23 DIAGNOSIS — Z803 Family history of malignant neoplasm of breast: Secondary | ICD-10-CM | POA: Insufficient documentation

## 2022-09-23 DIAGNOSIS — Z806 Family history of leukemia: Secondary | ICD-10-CM | POA: Diagnosis not present

## 2022-09-23 DIAGNOSIS — Z800 Family history of malignant neoplasm of digestive organs: Secondary | ICD-10-CM | POA: Insufficient documentation

## 2022-09-23 DIAGNOSIS — Z79811 Long term (current) use of aromatase inhibitors: Secondary | ICD-10-CM | POA: Diagnosis not present

## 2022-09-23 DIAGNOSIS — G4700 Insomnia, unspecified: Secondary | ICD-10-CM | POA: Diagnosis not present

## 2022-09-23 DIAGNOSIS — Z90710 Acquired absence of both cervix and uterus: Secondary | ICD-10-CM | POA: Insufficient documentation

## 2022-09-23 MED ORDER — EXEMESTANE 25 MG PO TABS: 25.0000 mg | ORAL_TABLET | Freq: Every day | ORAL | 1 refills | Status: DC

## 2022-09-23 NOTE — Progress Notes (Signed)
[facility] NOTE  Patient Care Team: Burnard Bunting, MD as PCP - General (Internal Medicine) Rolm Bookbinder, MD as Consulting Physician (General Surgery) Benay Pike, MD as Consulting Physician (Hematology and Oncology) Gery Pray, MD as Consulting Physician (Radiation Oncology)  CHIEF COMPLAINTS/PURPOSE OF CONSULTATION:  DCIS  HISTORY OF PRESENTING ILLNESS:  Theresa Shannon 77 y.o. female is here because of recent diagnosis of right breast DCIS  I reviewed her records extensively and collaborated the history with the patient.  SUMMARY OF ONCOLOGIC HISTORY: Oncology History  Ductal carcinoma in situ (DCIS) of right breast  10/15/2021 Mammogram   Digital screening bilateral mammogram showed indeterminate calcifications in the right breast.   10/31/2021 Pathology Results   Pathology from the breast biopsy showed low-grade DCIS with calcifications rest of the needle core biopsy showed fibrocystic changes with usual ductal hyperplasia and vascular calcifications ER 100% positive strong staining, PR 95% positive strong staining   11/12/2021 Initial Diagnosis   Ductal carcinoma in situ (DCIS) of right breast   11/14/2021 Cancer Staging   Staging form: Breast, AJCC 8th Edition - Clinical stage from 11/14/2021: Stage 0 (cTis (DCIS), cN0, cM0, ER+, PR+, HER2: Not Assessed) - Signed by Benay Pike, MD on 11/14/2021 Stage prefix: Initial diagnosis Nuclear grade: G1 Laterality: Right Staged by: Pathologist and managing physician Stage used in treatment planning: Yes National guidelines used in treatment planning: Yes Type of national guideline used in treatment planning: NCCN    Genetic Testing   [facility] CustomNext Panel was Negative. Report date is 12/05/2021.  The CustomNext-Cancer+RNAinsight panel offered by [facility] includes sequencing and rearrangement analysis for the following 52 genes :  APC, ATM, AXIN2, BAP1, BARD1, BMPR1A, BRCA1, BRCA2,  BRIP1, CDH1, CDK4, CDKN2A, CHEK2, CTNNA1, DICER1, EPCAM, FH, FLCN, GREM1, HOXB13, KIT, MEN1, MET, MITF, MLH1, MSH2, MSH3, MSH6, MUTYH, NBN, NF1, NTHL1, PALB2, PDGFRA, PMS2, POLD1, POLE, PTEN, RAD50, RAD51C, RAD51D, SDHA, SDHB, SDHC, SDHD, SMAD4, SMARCA4, STK11, TP53, TSC1, TSC2, and VHL.  RNA data is routinely analyzed for use in variant interpretation for all genes.    12/28/2021 Surgery   Right breast lumpectomy initially done on 518 showed DCIS with calcifications partially involving a fibrotic papillary lesion, focally less than 1 mm from final medial margin.  She underwent reexcision and this once again showed low-grade DCIS measuring 1 cm again 1 mm from the final medial margin fibrosis and inflammation consistent with previous lumpectomy. Prior prognostic showed ER 100% positive strong staining and PR 95% positive strong staining   02/05/2022 - 03/06/2022 Radiation Therapy   Site Technique Total Dose (Gy) Dose per Fx (Gy) Completed Fx Beam Energies  Breast, Right: Breast_R 3D 40.05/40.05 2.67 15/15 6XFFF  Breast, Right: Breast_R_Bst 3D 12/12 2 6/6 6X, 10X     03/2022 -  Anti-estrogen oral therapy   Anastrozole    Patient is here for follow-up with her husband.  She is taking anastrozole as prescribed. She says she is feeling some arthralgias especially in the right foot. She has also noted insomnia and she hasn't slept a minute last night. She is also going through some dental work and has some pain related to it. She still weak in her right arm, she says. Leg swelling has remained the same since surgery she says.  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   Chronic kidney disease    hypertensive chronic kideny disease benign/ stage 3   Diabetes mellitus, type 2 (HCC)    Esophagitis determined by  endoscopy 03/2015   GERD (gastroesophageal reflux disease)    Gout 04/2015   L toe   History of frequent urinary tract infections    History of hiatal hernia    History of kidney stones    HTN (hypertension)    Hyperlipemia    Hypertension    Hypothyroidism    Peripheral neuropathy    Peripheral vascular disease (HCC)    Spondylolisthesis of lumbar region    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; right eye    Thyroid goiter    multinodular    Wears glasses     SURGICAL HISTORY: Past Surgical History:  Procedure Laterality Date   ABDOMINAL HYSTERECTOMY     ABSCESS DRAINAGE  07/08/1993   breast   ANTERIOR LAT LUMBAR FUSION N/A 02/02/2018   Procedure: Anterolateral decompression Lumbar Four-Five with xlif percutaneous fixation with robotic placement of screws Lumbar four-five;  Surgeon: Kristeen Miss, MD;  Location: [facility];  Service: Neurosurgery;  Laterality: N/A;   APPENDECTOMY     APPLICATION OF ROBOTIC ASSISTANCE FOR SPINAL PROCEDURE N/A 02/02/2018   Procedure: APPLICATION OF ROBOTIC ASSISTANCE FOR SPINAL PROCEDURE;  Surgeon: Kristeen Miss, MD;  Location: [place];  Service: Neurosurgery;  Laterality: N/A;   BACK SURGERY     BREAST BIOPSY     BREAST CYST EXCISION  02/26/2012   Procedure: CYST EXCISION BREAST;  Surgeon: Harl Bowie, MD;  Location: [place];  Service: General;  Laterality: Left;  excision chronic cyst left breast   BREAST LUMPECTOMY WITH RADIOACTIVE SEED LOCALIZATION Right 11/22/2021   Procedure: RIGHT BREAST LUMPECTOMY WITH RADIOACTIVE SEED LOCALIZATION;  Surgeon: Rolm Bookbinder, MD;  Location: [place];  Service: General;  Laterality: Right;   BUNIONECTOMY     LEFT   CHOLECYSTECTOMY N/A 08/17/2015   Procedure: LAPAROSCOPIC CHOLECYSTECTOMY WITH INTRAOPERATIVE CHOLANGIOGRAM;  Surgeon: Armandina Gemma, MD;  Location: [facility];  Service: General;  Laterality: N/A;   DILATION AND CURETTAGE OF UTERUS     KNEE SURGERY     BILATERAL   LUMBAR PERCUTANEOUS  PEDICLE SCREW 1 LEVEL N/A 02/02/2018   Procedure: LUMBAR PERCUTANEOUS PEDICLE SCREW LUMBAR FOUR-FIVE;  Surgeon: Kristeen Miss, MD;  Location: [facility];  Service: Neurosurgery;  Laterality: N/A;   RE-EXCISION OF BREAST LUMPECTOMY Right 12/28/2021   Procedure: RE-EXCISION OF RIGHT BREAST MARGIN;  Surgeon: Rolm Bookbinder, MD;  Location: [facility];  Service: General;  Laterality: Right;   TEMPORAL ARTERY BIOPSY / LIGATION     TONSILLECTOMY AND ADENOIDECTOMY     TUBAL LIGATION     TUMOR REMOVAL     LEFT ARM-lipoma    SOCIAL HISTORY: Social History   Socioeconomic History   Marital status: Married    Spouse name: Elenore Rota   Number of children: 1   Years of education: 12th   Highest education level: Not on file  Occupational History   Occupation: Retired  Tobacco Use   Smoking status: Never   Smokeless tobacco: Never  Vaping Use   Vaping Use: Never used  Substance and Sexual Activity   Alcohol use: No   Drug use: No   Sexual activity: Not on file  Other Topics Concern   Not on file  Social History Narrative   Patient lives at home  with husband Elenore Rota.    Patient has 1 child.    Patient is retired.    Patient has a high school education.    Social Determinants of Health   Financial Resource Strain: Low Risk  (11/14/2021)   Overall Financial Resource Strain (CARDIA)    Difficulty of Paying Living Expenses: Not very hard  Food Insecurity: No Food Insecurity (11/14/2021)   Hunger Vital Sign    Worried About Running Out of Food in the Last Year: Never true    Ran Out of Food in the Last Year: Never true  Transportation Needs: No Transportation Needs (11/14/2021)   PRAPARE - Hydrologist (Medical): No    Lack of Transportation (Non-Medical): No  Physical Activity: Not on file  Stress: Not on file  Social Connections: Not on file  Intimate Partner Violence: Not on file    FAMILY HISTORY: Family History  Problem Relation Age of Onset   Dementia Mother     Kidney disease Mother    Cancer Mother    Diabetes Mother    Breast cancer Mother 29   Dementia Father    Heart disease Father    Hypertension Father    Stroke Father    Breast cancer Sister 75   Kidney cancer Sister 30   Leukemia Maternal Aunt    Pancreatic cancer Paternal Aunt     ALLERGIES:  is allergic to amlodipine, oxycodone-acetaminophen, oxaprozin, tyloxapol, and other.  MEDICATIONS:  Current Outpatient Medications  Medication Sig Dispense Refill   exemestane (AROMASIN) 25 MG tablet Take 1 tablet (25 mg total) by mouth daily after breakfast. 90 tablet 1   acetaminophen (TYLENOL) 325 MG tablet Take 650 mg by mouth every 6 (six) hours as needed for mild pain.     aspirin 81 MG chewable tablet Chew 81 mg by mouth daily.     atenolol (TENORMIN) 50 MG tablet Take 1 tablet (50 mg total) by mouth daily. 90 tablet 4   cholecalciferol (VITAMIN D) 1000 units tablet Take 1,000 Units by mouth daily.     fenofibrate (TRICOR) 145 MG tablet Take 1 tablet (145 mg total) by mouth daily. Please make overdue yearly appt with Dr. Acie Fredrickson before anymore refills. 2nd attempt 15 tablet 0   JARDIANCE 25 MG TABS tablet Take 25 mg by mouth daily.     levothyroxine (SYNTHROID) 150 MCG tablet Take 150 mcg by mouth every morning.     metFORMIN (GLUCOPHAGE-XR) 500 MG 24 hr tablet Take 500 mg by mouth 2 (two) times daily.     Multiple Vitamin (MULTIVITAMIN WITH MINERALS) TABS tablet Take 1 tablet by mouth daily.     olmesartan-hydrochlorothiazide (BENICAR HCT) 40-25 MG tablet Take 1 tablet by mouth daily.  3   No current facility-administered medications for this visit.    REVIEW OF SYSTEMS:   Constitutional: Denies fevers, chills or abnormal night sweats Eyes: Denies blurriness of vision, double vision or watery eyes Ears, nose, mouth, throat, and face: Denies mucositis or sore throat Respiratory: Denies cough, dyspnea or wheezes Cardiovascular: Denies palpitation, chest discomfort or lower  extremity swelling Gastrointestinal:  Denies nausea, heartburn or change in bowel habits Skin: Denies abnormal skin rashes Lymphatics: Denies new lymphadenopathy or easy bruising Neurological:Denies numbness, tingling or new weaknesses Behavioral/Psych: Mood is stable, no new changes  Breast: Denies any palpable lumps or discharge All other systems were reviewed with the patient and are negative.  PHYSICAL EXAMINATION: ECOG PERFORMANCE STATUS: 0 - Asymptomatic  Vitals:  09/23/22 0916  BP: (!) 167/58  Pulse: (!) 51  Resp: 16  Temp: 98.1 F (36.7 C)  SpO2: 98%    Filed Weights   09/23/22 0916  Weight: 174 lb (78.9 kg)      LABORATORY DATA:  I have reviewed the data as listed Lab Results  Component Value Date   WBC 5.0 12/19/2021   HGB 10.6 (L) 12/19/2021   HCT 34.9 (L) 12/19/2021   MCV 95.1 12/19/2021   PLT 199 12/19/2021   Lab Results  Component Value Date   NA 140 12/19/2021   K 5.1 12/19/2021   CL 111 12/19/2021   CO2 19 (L) 12/19/2021   Physical Exam Constitutional:      Appearance: Normal appearance.  Cardiovascular:     Rate and Rhythm: Normal rate and regular rhythm.  Pulmonary:     Effort: Pulmonary effort is normal.     Breath sounds: Normal breath sounds.  Chest:     Comments: Right right breast status postradiation changes. Abdominal:     General: Abdomen is flat. Bowel sounds are normal.     Palpations: Abdomen is soft.  Skin:    General: Skin is warm and dry.  Neurological:     General: No focal deficit present.     Mental Status: She is alert.  Psychiatric:        Mood and Affect: Mood normal.      RADIOGRAPHIC STUDIES: I have personally reviewed the radiological reports and agreed with the findings in the report.  ASSESSMENT AND PLAN:   Ductal carcinoma in situ (DCIS) of right breast This is a very pleasant 77 year old female patient with right breast DCIS, ER/PR positive status post right breast lumpectomy with reexcision,  final pathology showing DCIS with close margin, prior prognostic showed ER/PR positive DCIS s/p adjuvant radiation.  Given her prior episode of blood clot in her eye and blindness in the right eye, she is now on anastrozole. She tells me however that since she started the anastrozole, she has not been able to sleep well, last night she did not even sleep for a minute.  She feels tired because of this.  She has noted some arthralgias and her ankle joints.  She used to have gout in the past.  She otherwise denies any changes in the breast.  She is due for her mammogram in April.  Baseline bone density not scheduled. Physical examination today with no major concerns.  However given her severe insomnia we will switch to aromatase inhibitors to exemestane.  We once again discussed about exemestane today.  Mammogram and bone density ordered.  She will return to clinic in person in 6 months, 3 months telephone visit to see how she is doing with exemestane. I encouraged physical therapy for the right arm to help with mobility.  I do not believe her lower extremity swelling is related to surgery.   Total time spent: 30 minutes. All questions were answered. The patient knows to call the clinic with any problems, questions or concerns.    Benay Pike, MD 09/23/22

## 2022-09-23 NOTE — Assessment & Plan Note (Addendum)
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  Given her prior episode of blood clot in her eye and blindness in the right eye, she is now on anastrozole. She tells me however that since she started the anastrozole, she has not been able to sleep well, last night she did not even sleep for a minute.  She feels tired because of this.  She has noted some arthralgias and her ankle joints.  She used to have gout in the past.  She otherwise denies any changes in the breast.  She is due for her mammogram in April.  Baseline bone density not scheduled. Physical examination today with no major concerns.  However given her severe insomnia we will switch to aromatase inhibitors to exemestane.  We once again discussed about exemestane today.  Mammogram and bone density ordered.  She will return to clinic in person in 6 months, 3 months telephone visit to see how she is doing with exemestane. I encouraged physical therapy for the right arm to help with mobility.  I do not believe her lower extremity swelling is related to surgery.

## 2022-10-25 ENCOUNTER — Other Ambulatory Visit: Payer: Self-pay

## 2022-10-25 ENCOUNTER — Emergency Department (HOSPITAL_COMMUNITY)
Admission: EM | Admit: 2022-10-25 | Discharge: 2022-10-25 | Disposition: A | Discharge status: Home / Self Care | Payer: Federal, State, Local not specified - PPO | Attending: Emergency Medicine | Admitting: Emergency Medicine

## 2022-10-25 ENCOUNTER — Encounter (HOSPITAL_COMMUNITY): Payer: Self-pay

## 2022-10-25 DIAGNOSIS — Z79899 Other long term (current) drug therapy: Secondary | ICD-10-CM | POA: Diagnosis not present

## 2022-10-25 DIAGNOSIS — E039 Hypothyroidism, unspecified: Secondary | ICD-10-CM | POA: Insufficient documentation

## 2022-10-25 DIAGNOSIS — I129 Hypertensive chronic kidney disease with stage 1 through stage 4 chronic kidney disease, or unspecified chronic kidney disease: Secondary | ICD-10-CM | POA: Diagnosis not present

## 2022-10-25 DIAGNOSIS — N189 Chronic kidney disease, unspecified: Secondary | ICD-10-CM | POA: Insufficient documentation

## 2022-10-25 DIAGNOSIS — Z7984 Long term (current) use of oral hypoglycemic drugs: Secondary | ICD-10-CM | POA: Insufficient documentation

## 2022-10-25 DIAGNOSIS — E1122 Type 2 diabetes mellitus with diabetic chronic kidney disease: Secondary | ICD-10-CM | POA: Diagnosis not present

## 2022-10-25 DIAGNOSIS — R21 Rash and other nonspecific skin eruption: Secondary | ICD-10-CM | POA: Insufficient documentation

## 2022-10-25 LAB — COMPREHENSIVE METABOLIC PANEL
ALT: 43 U/L (ref 0–44)
AST: 19 U/L (ref 15–41)
Albumin: 3.6 g/dL (ref 3.5–5.0)
Alkaline Phosphatase: 46 U/L (ref 38–126)
Anion gap: 11 (ref 5–15)
BUN: 37 mg/dL — ABNORMAL HIGH (ref 8–23)
CO2: 21 mmol/L — ABNORMAL LOW (ref 22–32)
Calcium: 8.7 mg/dL — ABNORMAL LOW (ref 8.9–10.3)
Chloride: 102 mmol/L (ref 98–111)
Creatinine, Ser: 2.02 mg/dL — ABNORMAL HIGH (ref 0.44–1.00)
GFR, Estimated: 25 mL/min — ABNORMAL LOW (ref >60)
Glucose, Bld: 192 mg/dL — ABNORMAL HIGH (ref 70–99)
Potassium: 4.1 mmol/L (ref 3.5–5.1)
Sodium: 134 mmol/L — ABNORMAL LOW (ref 135–145)
Total Bilirubin: 0.7 mg/dL (ref 0.3–1.2)
Total Protein: 7.4 g/dL (ref 6.5–8.1)

## 2022-10-25 LAB — SEDIMENTATION RATE: Sed Rate: 40 mm/hr — ABNORMAL HIGH (ref 0–22)

## 2022-10-25 LAB — CBC WITH DIFFERENTIAL/PLATELET
Abs Immature Granulocytes: 0.03 10*3/uL (ref 0.00–0.07)
Basophils Absolute: 0.1 10*3/uL (ref 0.0–0.1)
Basophils Relative: 1 %
Eosinophils Absolute: 1.2 10*3/uL — ABNORMAL HIGH (ref 0.0–0.5)
Eosinophils Relative: 24 %
HCT: 36.5 % (ref 36.0–46.0)
Hemoglobin: 11.2 g/dL — ABNORMAL LOW (ref 12.0–15.0)
Immature Granulocytes: 1 %
Lymphocytes Relative: 11 %
Lymphs Abs: 0.6 10*3/uL — ABNORMAL LOW (ref 0.7–4.0)
MCH: 29.2 pg (ref 26.0–34.0)
MCHC: 30.7 g/dL (ref 30.0–36.0)
MCV: 95.1 fL (ref 80.0–100.0)
Monocytes Absolute: 0.5 10*3/uL (ref 0.1–1.0)
Monocytes Relative: 11 %
Neutro Abs: 2.6 10*3/uL (ref 1.7–7.7)
Neutrophils Relative %: 52 %
Platelets: 215 10*3/uL (ref 150–400)
RBC: 3.84 MIL/uL — ABNORMAL LOW (ref 3.87–5.11)
RDW: 15.8 % — ABNORMAL HIGH (ref 11.5–15.5)
WBC: 4.9 10*3/uL (ref 4.0–10.5)
nRBC: 0 % (ref 0.0–0.2)

## 2022-10-25 LAB — PROTIME-INR
INR: 1.2 (ref 0.8–1.2)
Prothrombin Time: 14.7 seconds (ref 11.4–15.2)

## 2022-10-25 LAB — APTT: aPTT: 28 seconds (ref 24–36)

## 2022-10-25 LAB — C-REACTIVE PROTEIN: CRP: 6.9 mg/dL — ABNORMAL HIGH (ref <1.0)

## 2022-10-25 MED ORDER — PREDNISONE 20 MG PO TABS: 40.0000 mg | ORAL_TABLET | Freq: Every day | ORAL | 0 refills | Status: DC

## 2022-10-25 MED ORDER — DIPHENHYDRAMINE HCL 25 MG PO CAPS
25.0000 mg | ORAL_CAPSULE | Freq: Once | ORAL | Status: AC
  Administered 2022-10-25: 25 mg via ORAL
  Filled 2022-10-25: qty 1

## 2022-10-25 MED ORDER — METHYLPREDNISOLONE SODIUM SUCC 125 MG IJ SOLR
125.0000 mg | Freq: Once | INTRAMUSCULAR | Status: AC
  Administered 2022-10-25: 125 mg via INTRAVENOUS
  Filled 2022-10-25: qty 2

## 2022-10-25 NOTE — ED Notes (Signed)
Pt discharged home. Discuss discharge information. No s/s of distress observed during discharge.

## 2022-10-25 NOTE — Discharge Instructions (Signed)
All your labs look reassuring today.  However you need to keep a very close eye on this rash.  If it anytime your skin starts blistering or peeling or you start getting swelling and peeling of your lips or rash inside her mouth you need to return to the emergency room.  Stop the allopurinol for the gout and never take it again.  Also stop the cancer medication at this time and follow-up with the cancer center in the future about what you should take once the rash is resolved.

## 2022-10-25 NOTE — ED Provider Notes (Signed)
[place] EMERGENCY DEPARTMENT AT [facility] Provider Note   CSN: [id number] Arrival date & time: 10/25/22  1147     History  Chief Complaint  Patient presents with   Rash    Theresa Shannon is a 77 y.o. female.  Patient is a 77 year old female with a history of hypertension, hyperlipidemia, hypothyroid, gout, chronic kidney disease, diabetes, PVD who is presenting today from her doctor's office for a rash.  Patient reports the rash started on Tuesday and has gradually worsened.  She thinks it started on her legs and then spread to involve her entire body.  Prior to the rash developing approximately Saturday or Sunday she developed a cough and nasal congestion.  This has persisted but she has not had any shortness of breath.  She has had somewhat of a scratchy throat but denies severe throat pain.  She states the rash is very itchy and her skin feels hot.  In the last 3 weeks she recently started allopurinol because she had had a flare of gout as well as exemestane for her cancer she went to her doctor's office today and they were concerned for possible Stevens-Johnson's and sent her here for further evaluation.  She had a negative strep, COVID test done in the office.  Also chest x-ray was normal.  The history is provided by the patient, the spouse and medical records.  Rash      Home Medications Prior to Admission medications   Medication Sig Start Date End Date Taking? Authorizing Provider  predniSONE (DELTASONE) 20 MG tablet Take 2 tablets (40 mg total) by mouth daily. 10/25/22  Yes Gwyneth Sprout, MD  acetaminophen (TYLENOL) 325 MG tablet Take 650 mg by mouth every 6 (six) hours as needed for mild pain.    [provider]  aspirin 81 MG chewable tablet Chew 81 mg by mouth daily.    [provider]  atenolol (TENORMIN) 50 MG tablet Take 1 tablet (50 mg total) by mouth daily. 05/13/19   Levert Feinstein, MD  cholecalciferol (VITAMIN D) 1000 units tablet  Take 1,000 Units by mouth daily.    [provider]  exemestane (AROMASIN) 25 MG tablet Take 1 tablet (25 mg total) by mouth daily after breakfast. 09/23/22   Rachel Moulds, MD  fenofibrate (TRICOR) 145 MG tablet Take 1 tablet (145 mg total) by mouth daily. Please make overdue yearly appt with Dr. Elease Hashimoto before anymore refills. 2nd attempt 04/03/18   Nahser, Deloris Ping, MD  JARDIANCE 25 MG TABS tablet Take 25 mg by mouth daily. 06/07/22   [provider]  levothyroxine (SYNTHROID) 150 MCG tablet Take 150 mcg by mouth every morning. 06/06/22   [provider]  metFORMIN (GLUCOPHAGE-XR) 500 MG 24 hr tablet Take 500 mg by mouth 2 (two) times daily. 06/05/22   [provider]  Multiple Vitamin (MULTIVITAMIN WITH MINERALS) TABS tablet Take 1 tablet by mouth daily.    [provider]  olmesartan-hydrochlorothiazide (BENICAR HCT) 40-25 MG tablet Take 1 tablet by mouth daily. 04/29/18   [provider]      Allergies    Amlodipine, Oxycodone-acetaminophen, Oxaprozin, Tyloxapol, and Other    Review of Systems   Review of Systems  Skin:  Positive for rash.    Physical Exam Updated Vital Signs BP (!) 123/49   Pulse 73   Temp 98.1 F (36.7 C) (Oral)   Resp 18   Ht  (1.6 m)   Wt 77.1 kg   SpO2  100%   BMI 30.11 kg/m  Physical Exam Vitals and nursing note reviewed.  Constitutional:      General: She is not in acute distress.    Appearance: She is well-developed.  HENT:     Head: Normocephalic and atraumatic.     Mouth/Throat:     Comments: No lesions noted on the lips.  No lesions seen on the soft palate, pharynx, oral mucosa or tongue Eyes:     Conjunctiva/sclera:     Right eye: Right conjunctiva is injected.     Pupils: Pupils are equal, round, and reactive to light.     Comments: Right-sided conjunctivitis  Cardiovascular:     Rate and Rhythm: Normal rate and regular rhythm.     Heart sounds: Normal heart sounds. No murmur  heard.    No friction rub.  Pulmonary:     Effort: Pulmonary effort is normal.     Breath sounds: Normal breath sounds. No wheezing or rales.     Comments: Intermittent coughing on exam but breath sounds are clear Abdominal:     General: Bowel sounds are normal. There is no distension.     Palpations: Abdomen is soft.     Tenderness: There is no abdominal tenderness. There is no guarding or rebound.  Musculoskeletal:        General: No tenderness. Normal range of motion.     Comments: No edema  Skin:    General: Skin is warm and dry.     Findings: Rash present.     Comments: Diffuse confluent rash involving the entire body.  Confluence over the back and face.  Patchy over the arms and legs.  Small areas of purpura around the ankles.  No bulla, weeping or drainage from the rash.  Skin is all intact  Neurological:     Mental Status: She is alert and oriented to person, place, and time. Mental status is at baseline.     Cranial Nerves: No cranial nerve deficit.  Psychiatric:        Behavior: Behavior normal.     ED Results / Procedures / Treatments   Labs (all labs ordered are listed, but only abnormal results are displayed) Labs Reviewed  CBC WITH DIFFERENTIAL/PLATELET - Abnormal; Notable for the following components:      Result Value   RBC 3.84 (*)    Hemoglobin 11.2 (*)    RDW 15.8 (*)    Lymphs Abs 0.6 (*)    Eosinophils Absolute 1.2 (*)    All other components within normal limits  COMPREHENSIVE METABOLIC PANEL - Abnormal; Notable for the following components:   Sodium 134 (*)    CO2 21 (*)    Glucose, Bld 192 (*)    BUN 37 (*)    Creatinine, Ser 2.02 (*)    Calcium 8.7 (*)    GFR, Estimated 25 (*)    All other components within normal limits  SEDIMENTATION RATE - Abnormal; Notable for the following components:   Sed Rate 40 (*)    All other components within normal limits  PROTIME-INR  APTT  C-REACTIVE PROTEIN    EKG None  Radiology No results  found.  Procedures Procedures    Medications Ordered in ED Medications  diphenhydrAMINE (BENADRYL) capsule 25 mg (has no administration in time range)  methylPREDNISolone sodium succinate (SOLU-MEDROL) 125 mg/2 mL injection 125 mg (125 mg Intravenous Given 10/25/22 1326)    ED Course/ Medical Decision Making/ A&P  Medical Decision Making Amount and/or Complexity of Data Reviewed Labs: ordered. Decision-making details documented in ED Course.  Risk Prescription drug management.   Pt with multiple medical problems and comorbidities and presenting today with a complaint that caries a high risk for morbidity and mortality.  Here today with a rash.  Patient had recently started allopurinol and exemestane within the last 3 weeks prior to the rash starting.  Possibility for early Stevens-Johnsons/TENS, patient is not having the fever and lymphadenopathy associated with DRESS.  However patient also does not have any skin breakdown or sloughing of the skin.  No obvious lesions inside the mouth but she does have right-sided conjunctivitis.  She is hemodynamically stable on exam but does have a mild cough.  Patient has not taken the medications today.  She is complaining of significant itching of her skin.  She was given Solu-Medrol and Benadryl.  Chest x-ray was normal at her PCP office today and COVID and strep were negative.  Labs are pending.  4:05 PM I independently interpreted patient's labs.  CBC with stable hemoglobin, normal white count, relative eosinophils are normal but absolute eosinophils are elevated at 1.2, CMP with creatinine of 2.02 today which is speaking with the patient she thinks that is close to her baseline.  A year ago patient's creatinine was 1.7 but no more recent lab values present.  LFTs are within normal limits and calcium is mildly low at 8.7, coagulation panel is normal, age-adjusted sed rate is normal.  Her age-adjusted sedimentation rate  would be 44 and it is 40 today.  On reevaluation after receiving steroids patient's rash looks better.  Her face is no longer red.  At this time cannot 100% rule out early Stevens-Johnson's but at this time do not feel that patient needs admission.  Had a long discussion with the patient and her husband about close surveillance of the rash and if there is any changes or development of blistering or skin peeling return to the emergency room.  Also patient will be discontinued on the allopurinol indefinitely and then she will also hold the cancer medication.  Spoke with Dr. Al Pimple and they recommended discontinuing the medication for now and they will follow-up with the patient once the rash has resolved about how to go forward.  Patient and her husband are comfortable with this plan.  CRP is pending.  She can follow-up with her PCP next week to follow-up on this test.         Final Clinical Impression(s) / ED Diagnoses Final diagnoses:  Rash    Rx / DC Orders ED Discharge Orders          Ordered    predniSONE (DELTASONE) 20 MG tablet  Daily        10/25/22 1600              Gwyneth Sprout, MD 10/25/22 1605

## 2022-10-25 NOTE — ED Triage Notes (Signed)
Pt to er, pt states that she was seen by and NP at her pmd office and was told to come to the er for R/O stevens johnsons syndrome, pt c/p rash on person.  States that the rash is hot and itches.  Pt reports that in the last three weeks she was started on allopurinol exemestane

## 2022-10-28 ENCOUNTER — Telehealth: Payer: Self-pay | Admitting: Hematology and Oncology

## 2022-10-28 NOTE — Telephone Encounter (Signed)
Spoke with patient confirming upcoming appointment  

## 2022-11-18 ENCOUNTER — Ambulatory Visit
Admission: RE | Admit: 2022-11-18 | Discharge: 2022-11-18 | Disposition: A | Discharge status: Home / Self Care | Payer: Federal, State, Local not specified - PPO | Source: Ambulatory Visit | Attending: Hematology and Oncology | Admitting: Hematology and Oncology

## 2022-11-18 DIAGNOSIS — D0511 Intraductal carcinoma in situ of right breast: Secondary | ICD-10-CM

## 2022-11-27 ENCOUNTER — Inpatient Hospital Stay
Payer: Federal, State, Local not specified - PPO | Attending: Hematology and Oncology | Admitting: Hematology and Oncology

## 2022-11-27 ENCOUNTER — Other Ambulatory Visit: Payer: Self-pay

## 2022-11-27 VITALS — BP 138/55 | HR 78 | Temp 98.1°F | Resp 18 | Ht 63.0 in | Wt 173.4 lb

## 2022-11-27 DIAGNOSIS — Z79890 Hormone replacement therapy (postmenopausal): Secondary | ICD-10-CM | POA: Diagnosis not present

## 2022-11-27 DIAGNOSIS — Z7952 Long term (current) use of systemic steroids: Secondary | ICD-10-CM | POA: Diagnosis not present

## 2022-11-27 DIAGNOSIS — Z79811 Long term (current) use of aromatase inhibitors: Secondary | ICD-10-CM | POA: Insufficient documentation

## 2022-11-27 DIAGNOSIS — Z923 Personal history of irradiation: Secondary | ICD-10-CM | POA: Diagnosis not present

## 2022-11-27 DIAGNOSIS — Z87440 Personal history of urinary (tract) infections: Secondary | ICD-10-CM | POA: Insufficient documentation

## 2022-11-27 DIAGNOSIS — E039 Hypothyroidism, unspecified: Secondary | ICD-10-CM | POA: Insufficient documentation

## 2022-11-27 DIAGNOSIS — Z170 Estrogen receptor positive status [ER+]: Secondary | ICD-10-CM | POA: Insufficient documentation

## 2022-11-27 DIAGNOSIS — E1151 Type 2 diabetes mellitus with diabetic peripheral angiopathy without gangrene: Secondary | ICD-10-CM | POA: Insufficient documentation

## 2022-11-27 DIAGNOSIS — Z7984 Long term (current) use of oral hypoglycemic drugs: Secondary | ICD-10-CM | POA: Insufficient documentation

## 2022-11-27 DIAGNOSIS — E119 Type 2 diabetes mellitus without complications: Secondary | ICD-10-CM | POA: Diagnosis not present

## 2022-11-27 DIAGNOSIS — G629 Polyneuropathy, unspecified: Secondary | ICD-10-CM | POA: Diagnosis not present

## 2022-11-27 DIAGNOSIS — Z87442 Personal history of urinary calculi: Secondary | ICD-10-CM | POA: Insufficient documentation

## 2022-11-27 DIAGNOSIS — N183 Chronic kidney disease, stage 3 unspecified: Secondary | ICD-10-CM | POA: Diagnosis not present

## 2022-11-27 DIAGNOSIS — I739 Peripheral vascular disease, unspecified: Secondary | ICD-10-CM | POA: Diagnosis not present

## 2022-11-27 DIAGNOSIS — I129 Hypertensive chronic kidney disease with stage 1 through stage 4 chronic kidney disease, or unspecified chronic kidney disease: Secondary | ICD-10-CM | POA: Insufficient documentation

## 2022-11-27 DIAGNOSIS — Z803 Family history of malignant neoplasm of breast: Secondary | ICD-10-CM | POA: Insufficient documentation

## 2022-11-27 DIAGNOSIS — R21 Rash and other nonspecific skin eruption: Secondary | ICD-10-CM | POA: Insufficient documentation

## 2022-11-27 DIAGNOSIS — Z79899 Other long term (current) drug therapy: Secondary | ICD-10-CM | POA: Insufficient documentation

## 2022-11-27 DIAGNOSIS — M109 Gout, unspecified: Secondary | ICD-10-CM | POA: Insufficient documentation

## 2022-11-27 DIAGNOSIS — Z8673 Personal history of transient ischemic attack (TIA), and cerebral infarction without residual deficits: Secondary | ICD-10-CM | POA: Insufficient documentation

## 2022-11-27 DIAGNOSIS — E785 Hyperlipidemia, unspecified: Secondary | ICD-10-CM | POA: Insufficient documentation

## 2022-11-27 DIAGNOSIS — D0511 Intraductal carcinoma in situ of right breast: Secondary | ICD-10-CM | POA: Insufficient documentation

## 2022-11-27 DIAGNOSIS — Z7982 Long term (current) use of aspirin: Secondary | ICD-10-CM | POA: Diagnosis not present

## 2022-11-27 DIAGNOSIS — E1122 Type 2 diabetes mellitus with diabetic chronic kidney disease: Secondary | ICD-10-CM | POA: Diagnosis not present

## 2022-11-27 NOTE — Assessment & Plan Note (Signed)
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  She could not tolerate anastrozole, had severe insomnia and arthralgias and switch to exemestane.  She took it for about a week and also had an episode of gout at the same time when she was started on allopurinol.  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  Allopurinol and exemestane both were held.  It is a very uncommon side effect from exemestane and more commonly seen with allopurinol hence I suspect allopurinol as a likely culprit.  I have asked her to resume exemestane but call me with any changes.  I will try to do a telephone visit in about a month to make sure she is tolerating it well.  She is agreeable to this plan.

## 2022-11-27 NOTE — Progress Notes (Signed)
[facility] CONSULT NOTE  Patient Care Team: Geoffry Paradise, MD as PCP - General (Internal Medicine) Emelia Loron, MD as Consulting Physician (General Surgery) Rachel Moulds, MD as Consulting Physician (Hematology and Oncology) Antony Blackbird, MD as Consulting Physician (Radiation Oncology)  CHIEF COMPLAINTS/PURPOSE OF CONSULTATION:  DCIS  HISTORY OF PRESENTING ILLNESS:  Theresa Shannon 77 y.o. female is here because of recent diagnosis of right breast DCIS  I reviewed her records extensively and collaborated the history with the patient.  SUMMARY OF ONCOLOGIC HISTORY: Oncology History  Ductal carcinoma in situ (DCIS) of right breast  10/15/2021 Mammogram   Digital screening bilateral mammogram showed indeterminate calcifications in the right breast.   10/31/2021 Pathology Results   Pathology from the breast biopsy showed low-grade DCIS with calcifications rest of the needle core biopsy showed fibrocystic changes with usual ductal hyperplasia and vascular calcifications ER 100% positive strong staining, PR 95% positive strong staining   11/12/2021 Initial Diagnosis   Ductal carcinoma in situ (DCIS) of right breast   11/14/2021 Cancer Staging   Staging form: Breast, AJCC 8th Edition - Clinical stage from 11/14/2021: Stage 0 (cTis (DCIS), cN0, cM0, ER+, PR+, HER2: Not Assessed) - Signed by Rachel Moulds, MD on 11/14/2021 Stage prefix: Initial diagnosis Nuclear grade: G1 Laterality: Right Staged by: Pathologist and managing physician Stage used in treatment planning: Yes National guidelines used in treatment planning: Yes Type of national guideline used in treatment planning: NCCN    Genetic Testing   [facility] CustomNext Panel was Negative. Report date is 12/05/2021.  The CustomNext-Cancer+RNAinsight panel offered by [employer] [facility] includes sequencing and rearrangement analysis for the following 52 genes :  APC, ATM, AXIN2, BAP1, BARD1, BMPR1A, BRCA1, BRCA2,  BRIP1, CDH1, CDK4, CDKN2A, CHEK2, CTNNA1, DICER1, EPCAM, FH, FLCN, GREM1, HOXB13, KIT, MEN1, MET, MITF, MLH1, MSH2, MSH3, MSH6, MUTYH, NBN, NF1, NTHL1, PALB2, PDGFRA, PMS2, POLD1, POLE, PTEN, RAD50, RAD51C, RAD51D, SDHA, SDHB, SDHC, SDHD, SMAD4, SMARCA4, STK11, TP53, TSC1, TSC2, and VHL.  RNA data is routinely analyzed for use in variant interpretation for all genes.    12/28/2021 Surgery   Right breast lumpectomy initially done on 518 showed DCIS with calcifications partially involving a fibrotic papillary lesion, focally less than 1 mm from final medial margin.  She underwent reexcision and this once again showed low-grade DCIS measuring 1 cm again 1 mm from the final medial margin fibrosis and inflammation consistent with previous lumpectomy. 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  However she was also on exemestane at that time which was held.  She is here before resuming exemestane.  Her skin rash has almost healed except for dryness and peeling of skin on the lower extremities.  She feels well again.  Her most bothersome complaint today is the gout and related symptoms.  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   Chronic kidney disease    hypertensive chronic kideny disease benign/ stage 3   Diabetes mellitus, type 2 (HCC)    Esophagitis  determined by endoscopy 03/2015   GERD (gastroesophageal reflux disease)    Gout 04/2015   L toe   History of frequent urinary tract infections    History of hiatal hernia    History of kidney stones    HTN (hypertension)    Hyperlipemia    Hypertension    Hypothyroidism    Peripheral neuropathy    Peripheral vascular disease (HCC)    Spondylolisthesis of lumbar region    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; right eye    Thyroid goiter    multinodular    Wears glasses     SURGICAL HISTORY: Past Surgical History:  Procedure Laterality Date   ABDOMINAL HYSTERECTOMY     ABSCESS DRAINAGE  07/08/1993   breast   ANTERIOR LAT LUMBAR FUSION N/A 02/02/2018   Procedure: Anterolateral decompression Lumbar Four-Five with xlif percutaneous fixation with robotic placement of screws Lumbar four-five;  Surgeon: Barnett Abu, MD;  Location: [facility] OR;  Service: Neurosurgery;  Laterality: N/A;   APPENDECTOMY     APPLICATION OF ROBOTIC ASSISTANCE FOR SPINAL PROCEDURE N/A 02/02/2018   Procedure: APPLICATION OF ROBOTIC ASSISTANCE FOR SPINAL PROCEDURE;  Surgeon: Barnett Abu, MD;  Location: [facility] OR;  Service: Neurosurgery;  Laterality: N/A;   BACK SURGERY     BREAST BIOPSY     BREAST CYST EXCISION  02/26/2012   Procedure: CYST EXCISION BREAST;  Surgeon: Shelly Rubenstein, MD;  Location: [facility];  Service: General;  Laterality: Left;  excision chronic cyst left breast   BREAST LUMPECTOMY WITH RADIOACTIVE SEED LOCALIZATION Right 11/22/2021   Procedure: RIGHT BREAST LUMPECTOMY WITH RADIOACTIVE SEED LOCALIZATION;  Surgeon: Emelia Loron, MD;  Location: [facility] OR;  Service: General;  Laterality: Right;   BUNIONECTOMY     LEFT   CHOLECYSTECTOMY N/A 08/17/2015   Procedure: LAPAROSCOPIC CHOLECYSTECTOMY WITH INTRAOPERATIVE CHOLANGIOGRAM;  Surgeon: Darnell Level, MD;  Location: [facility] ORS;  Service: General;  Laterality: N/A;   DILATION AND CURETTAGE OF UTERUS     KNEE SURGERY     BILATERAL    LUMBAR PERCUTANEOUS PEDICLE SCREW 1 LEVEL N/A 02/02/2018   Procedure: LUMBAR PERCUTANEOUS PEDICLE SCREW LUMBAR FOUR-FIVE;  Surgeon: Barnett Abu, MD;  Location: [facility] OR;  Service: Neurosurgery;  Laterality: N/A;   RE-EXCISION OF BREAST LUMPECTOMY Right 12/28/2021   Procedure: RE-EXCISION OF RIGHT BREAST MARGIN;  Surgeon: Emelia Loron, MD;  Location: [facility];  Service: General;  Laterality: Right;   TEMPORAL ARTERY BIOPSY / LIGATION     TONSILLECTOMY AND ADENOIDECTOMY     TUBAL LIGATION     TUMOR REMOVAL     LEFT ARM-lipoma    SOCIAL HISTORY: Social History   Socioeconomic History   Marital status: Married    Spouse name: Dorinda Hill   Number of children: 1   Years of education: 12th   Highest education level: Not on file  Occupational History   Occupation: Retired  Tobacco Use   Smoking status: Never   Smokeless tobacco: Never  Vaping Use   Vaping Use: Never used  Substance and Sexual Activity   Alcohol use: No   Drug use: No   Sexual activity: Not on file  Other Topics Concern   Not on file  Social History Narrative   Patient lives at home  with husband Dorinda Hill.    Patient has 1 child.    Patient is retired.    Patient has a high school education.    Social Determinants of Health   Financial Resource Strain: Low Risk  (11/14/2021)   Overall Financial Resource Strain (CARDIA)    Difficulty of Paying Living Expenses: Not very hard  Food Insecurity: No Food Insecurity (11/14/2021)   Hunger Vital Sign    Worried About Running Out of Food in the Last Year: Never true    Ran Out of Food in the Last Year: Never true  Transportation Needs: No Transportation Needs (11/14/2021)   PRAPARE - Administrator, Civil Service (Medical): No    Lack of Transportation (Non-Medical): No  Physical Activity: Not on file  Stress: Not on file  Social Connections: Not on file  Intimate Partner Violence: Not on file    FAMILY HISTORY: Family History  Problem Relation Age of  Onset   Dementia Mother    Kidney disease Mother    Cancer Mother    Diabetes Mother    Breast cancer Mother 55   Dementia Father    Heart disease Father    Hypertension Father    Stroke Father    Breast cancer Sister 27   Kidney cancer Sister 52   Leukemia Maternal Aunt    Pancreatic cancer Paternal Aunt     ALLERGIES:  is allergic to amlodipine, oxycodone-acetaminophen, oxaprozin, tyloxapol, and other.  MEDICATIONS:  Current Outpatient Medications  Medication Sig Dispense Refill   acetaminophen (TYLENOL) 325 MG tablet Take 650 mg by mouth every 6 (six) hours as needed for mild pain.     aspirin 81 MG chewable tablet Chew 81 mg by mouth daily.     atenolol (TENORMIN) 50 MG tablet Take 1 tablet (50 mg total) by mouth daily. 90 tablet 4   cholecalciferol (VITAMIN D) 1000 units tablet Take 1,000 Units by mouth daily.     exemestane (AROMASIN) 25 MG tablet Take 1 tablet (25 mg total) by mouth daily after breakfast. 90 tablet 1   fenofibrate (TRICOR) 145 MG tablet Take 1 tablet (145 mg total) by mouth daily. Please make overdue yearly appt with Dr. Elease Hashimoto before anymore refills. 2nd attempt 15 tablet 0   JARDIANCE 25 MG TABS tablet Take 25 mg by mouth daily.     levothyroxine (SYNTHROID) 150 MCG tablet Take 150 mcg by mouth every morning.     metFORMIN (GLUCOPHAGE-XR) 500 MG 24 hr tablet Take 500 mg by mouth 2 (two) times daily.     Multiple Vitamin (MULTIVITAMIN WITH MINERALS) TABS tablet Take 1 tablet by mouth daily.     olmesartan-hydrochlorothiazide (BENICAR HCT) 40-25 MG tablet Take 1 tablet by mouth daily.  3   predniSONE (DELTASONE) 20 MG tablet Take 2 tablets (40 mg total) by mouth daily. 10 tablet 0   No current facility-administered medications for this visit.    REVIEW OF SYSTEMS:   Constitutional: Denies fevers, chills or abnormal night sweats Eyes: Denies blurriness of vision, double vision or watery eyes Ears, nose, mouth, throat, and face: Denies mucositis or sore  throat Respiratory: Denies cough, dyspnea or wheezes Cardiovascular: Denies palpitation, chest discomfort or lower extremity swelling Gastrointestinal:  Denies nausea, heartburn or change in bowel habits Skin: Denies abnormal skin rashes Lymphatics: Denies new lymphadenopathy or easy bruising Neurological:Denies numbness, tingling or new weaknesses Behavioral/Psych: Mood is stable, no new changes  Breast: Denies any palpable lumps or discharge All other systems  were reviewed with the patient and are negative.  PHYSICAL EXAMINATION: ECOG PERFORMANCE STATUS: 0 - Asymptomatic  Vitals:   11/27/22 1514  BP: (!) 138/55  Pulse: 78  Resp: 18  Temp: 98.1 F (36.7 C)  SpO2: 99%    Filed Weights   11/27/22 1514  Weight: 173 lb 7 oz (78.7 kg)    General appearance: Alert, oriented and in no acute distress Skin rash has almost healed.  LABORATORY DATA:  I have reviewed the data as listed Lab Results  Component Value Date   WBC 4.9 10/25/2022   HGB 11.2 (L) 10/25/2022   HCT 36.5 10/25/2022   MCV 95.1 10/25/2022   PLT 215 10/25/2022   Lab Results  Component Value Date   NA 134 (L) 10/25/2022   K 4.1 10/25/2022   CL 102 10/25/2022   CO2 21 (L) 10/25/2022      RADIOGRAPHIC STUDIES: I have personally reviewed the radiological reports and agreed with the findings in the report.  ASSESSMENT AND PLAN:   Ductal carcinoma in situ (DCIS) of right breast This is a very pleasant 77 year old female patient with right breast DCIS, ER/PR positive status post right breast lumpectomy with reexcision, final pathology showing DCIS with close margin, prior prognostic showed ER/PR positive DCIS s/p adjuvant radiation.  She could not tolerate anastrozole, had severe insomnia and arthralgias and switch to exemestane.  She took it for about a week and also had an episode of gout at the same time when she was started on allopurinol.  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  Allopurinol and exemestane both were held.  It is a very uncommon side effect from exemestane and more commonly seen with allopurinol hence I suspect allopurinol as a likely culprit.  I have asked her to resume exemestane but call me with any changes.  I will try to do a telephone visit in about a month to make sure she is tolerating it well.  She is agreeable to this plan.   Total time spent: 30 minutes. All questions were answered. The patient knows to call the clinic with any problems, questions or concerns.    Rachel Moulds, MD 11/27/22

## 2022-11-29 ENCOUNTER — Telehealth: Payer: Self-pay | Admitting: Hematology and Oncology

## 2022-11-29 NOTE — Telephone Encounter (Signed)
Spoke with patient confirming upcoming appointment  

## 2022-12-17 ENCOUNTER — Inpatient Hospital Stay
Payer: Federal, State, Local not specified - PPO | Attending: Hematology and Oncology | Admitting: Hematology and Oncology

## 2022-12-17 DIAGNOSIS — D0511 Intraductal carcinoma in situ of right breast: Secondary | ICD-10-CM

## 2022-12-17 NOTE — Progress Notes (Signed)
[facility] CONSULT NOTE  Patient Care Team: Geoffry Paradise, MD as PCP - General (Internal Medicine) Emelia Loron, MD as Consulting Physician (General Surgery) Rachel Moulds, MD as Consulting Physician (Hematology and Oncology) Antony Blackbird, MD as Consulting Physician (Radiation Oncology)  CHIEF COMPLAINTS/PURPOSE OF CONSULTATION:  DCIS  HISTORY OF PRESENTING ILLNESS:  Theresa Shannon 77 y.o. female is here because of recent diagnosis of right breast DCIS  I reviewed her records extensively and collaborated the history with the patient.  SUMMARY OF ONCOLOGIC HISTORY: Oncology History  Ductal carcinoma in situ (DCIS) of right breast  10/15/2021 Mammogram   Digital screening bilateral mammogram showed indeterminate calcifications in the right breast.   10/31/2021 Pathology Results   Pathology from the breast biopsy showed low-grade DCIS with calcifications rest of the needle core biopsy showed fibrocystic changes with usual ductal hyperplasia and vascular calcifications ER 100% positive strong staining, PR 95% positive strong staining   11/12/2021 Initial Diagnosis   Ductal carcinoma in situ (DCIS) of right breast   11/14/2021 Cancer Staging   Staging form: Breast, AJCC 8th Edition - Clinical stage from 11/14/2021: Stage 0 (cTis (DCIS), cN0, cM0, ER+, PR+, HER2: Not Assessed) - Signed by Rachel Moulds, MD on 11/14/2021 Stage prefix: Initial diagnosis Nuclear grade: G1 Laterality: Right Staged by: Pathologist and managing physician Stage used in treatment planning: Yes National guidelines used in treatment planning: Yes Type of national guideline used in treatment planning: NCCN    Genetic Testing   [facility] CustomNext Panel was Negative. Report date is 12/05/2021.  The CustomNext-Cancer+RNAinsight panel offered by [employer] includes sequencing and rearrangement analysis for the following 52 genes :  APC, ATM, AXIN2, BAP1, BARD1, BMPR1A, BRCA1, BRCA2,  BRIP1, CDH1, CDK4, CDKN2A, CHEK2, CTNNA1, DICER1, EPCAM, FH, FLCN, GREM1, HOXB13, KIT, MEN1, MET, MITF, MLH1, MSH2, MSH3, MSH6, MUTYH, NBN, NF1, NTHL1, PALB2, PDGFRA, PMS2, POLD1, POLE, PTEN, RAD50, RAD51C, RAD51D, SDHA, SDHB, SDHC, SDHD, SMAD4, SMARCA4, STK11, TP53, TSC1, TSC2, and VHL.  RNA data is routinely analyzed for use in variant interpretation for all genes.    12/28/2021 Surgery   Right breast lumpectomy initially done on 518 showed DCIS with calcifications partially involving a fibrotic papillary lesion, focally less than 1 mm from final medial margin.  She underwent reexcision and this once again showed low-grade DCIS measuring 1 cm again 1 mm from the final medial margin fibrosis and inflammation consistent with previous lumpectomy. 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    I have asked her to restart exemestane since her skin side effects were very unlikely related to the medication.  This is a follow-up phone call while she is on exemestane.  She states she is doing really well, still has some residual skin rash but overall no major issues.  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   Chronic  kidney disease    hypertensive chronic kideny disease benign/ stage 3   Diabetes mellitus, type 2 (HCC)    Esophagitis determined by endoscopy 03/2015   GERD (gastroesophageal reflux disease)     Gout 04/2015   L toe   History of frequent urinary tract infections    History of hiatal hernia    History of kidney stones    HTN (hypertension)    Hyperlipemia    Hypertension    Hypothyroidism    Peripheral neuropathy    Peripheral vascular disease (HCC)    Spondylolisthesis of lumbar region    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; right eye    Thyroid goiter    multinodular    Wears glasses     SURGICAL HISTORY: Past Surgical History:  Procedure Laterality Date   ABDOMINAL HYSTERECTOMY     ABSCESS DRAINAGE  07/08/1993   breast   ANTERIOR LAT LUMBAR FUSION N/A 02/02/2018   Procedure: Anterolateral decompression Lumbar Four-Five with xlif percutaneous fixation with robotic placement of screws Lumbar four-five;  Surgeon: Barnett Abu, MD;  Location: [facility] OR;  Service: Neurosurgery;  Laterality: N/A;   APPENDECTOMY     APPLICATION OF ROBOTIC ASSISTANCE FOR SPINAL PROCEDURE N/A 02/02/2018   Procedure: APPLICATION OF ROBOTIC ASSISTANCE FOR SPINAL PROCEDURE;  Surgeon: Barnett Abu, MD;  Location: [facility] OR;  Service: Neurosurgery;  Laterality: N/A;   BACK SURGERY     BREAST BIOPSY     BREAST CYST EXCISION  02/26/2012   Procedure: CYST EXCISION BREAST;  Surgeon: Shelly Rubenstein, MD;  Location: [facility];  Service: General;  Laterality: Left;  excision chronic cyst left breast   BREAST LUMPECTOMY WITH RADIOACTIVE SEED LOCALIZATION Right 11/22/2021   Procedure: RIGHT BREAST LUMPECTOMY WITH RADIOACTIVE SEED LOCALIZATION;  Surgeon: Emelia Loron, MD;  Location: [facility] OR;  Service: General;  Laterality: Right;   BUNIONECTOMY     LEFT   CHOLECYSTECTOMY N/A 08/17/2015   Procedure: LAPAROSCOPIC CHOLECYSTECTOMY WITH INTRAOPERATIVE CHOLANGIOGRAM;  Surgeon: Darnell Level, MD;  Location: [facility] ORS;  Service: General;  Laterality: N/A;   DILATION AND CURETTAGE OF UTERUS     KNEE SURGERY     BILATERAL   LUMBAR PERCUTANEOUS PEDICLE SCREW 1 LEVEL N/A 02/02/2018   Procedure: LUMBAR  PERCUTANEOUS PEDICLE SCREW LUMBAR FOUR-FIVE;  Surgeon: Barnett Abu, MD;  Location: [facility] OR;  Service: Neurosurgery;  Laterality: N/A;   RE-EXCISION OF BREAST LUMPECTOMY Right 12/28/2021   Procedure: RE-EXCISION OF RIGHT BREAST MARGIN;  Surgeon: Emelia Loron, MD;  Location: [facility];  Service: General;  Laterality: Right;   TEMPORAL ARTERY BIOPSY / LIGATION     TONSILLECTOMY AND ADENOIDECTOMY     TUBAL LIGATION     TUMOR REMOVAL     LEFT ARM-lipoma    SOCIAL HISTORY: Social History   Socioeconomic History   Marital status: Married    Spouse name: Dorinda Hill   Number of children: 1   Years of education: 12th   Highest education level: Not on file  Occupational History   Occupation: Retired  Tobacco Use   Smoking status: Never   Smokeless tobacco: Never  Vaping Use   Vaping Use: Never used  Substance and Sexual Activity   Alcohol use: No   Drug use: No   Sexual activity: Not on file  Other Topics Concern   Not on file  Social History Narrative   Patient lives at home with husband Dorinda Hill.    Patient has 1 child.  Patient is retired.    Patient has a high school education.    Social Determinants of Health   Financial Resource Strain: Low Risk  (11/14/2021)   Overall Financial Resource Strain (CARDIA)    Difficulty of Paying Living Expenses: Not very hard  Food Insecurity: No Food Insecurity (11/14/2021)   Hunger Vital Sign    Worried About Running Out of Food in the Last Year: Never true    Ran Out of Food in the Last Year: Never true  Transportation Needs: No Transportation Needs (11/14/2021)   PRAPARE - Administrator, Civil Service (Medical): No    Lack of Transportation (Non-Medical): No  Physical Activity: Not on file  Stress: Not on file  Social Connections: Not on file  Intimate Partner Violence: Not on file    FAMILY HISTORY: Family History  Problem Relation Age of Onset   Dementia Mother    Kidney disease Mother    Cancer Mother    Diabetes  Mother    Breast cancer Mother 39   Dementia Father    Heart disease Father    Hypertension Father    Stroke Father    Breast cancer Sister 64   Kidney cancer Sister 26   Leukemia Maternal Aunt    Pancreatic cancer Paternal Aunt     ALLERGIES:  is allergic to amlodipine, oxycodone-acetaminophen, oxaprozin, tyloxapol, and other.  MEDICATIONS:  Current Outpatient Medications  Medication Sig Dispense Refill   acetaminophen (TYLENOL) 325 MG tablet Take 650 mg by mouth every 6 (six) hours as needed for mild pain.     aspirin 81 MG chewable tablet Chew 81 mg by mouth daily.     atenolol (TENORMIN) 50 MG tablet Take 1 tablet (50 mg total) by mouth daily. 90 tablet 4   cholecalciferol (VITAMIN D) 1000 units tablet Take 1,000 Units by mouth daily.     exemestane (AROMASIN) 25 MG tablet Take 1 tablet (25 mg total) by mouth daily after breakfast. 90 tablet 1   fenofibrate (TRICOR) 145 MG tablet Take 1 tablet (145 mg total) by mouth daily. Please make overdue yearly appt with Dr. Elease Hashimoto before anymore refills. 2nd attempt 15 tablet 0   JARDIANCE 25 MG TABS tablet Take 25 mg by mouth daily.     levothyroxine (SYNTHROID) 150 MCG tablet Take 150 mcg by mouth every morning.     metFORMIN (GLUCOPHAGE-XR) 500 MG 24 hr tablet Take 500 mg by mouth 2 (two) times daily.     Multiple Vitamin (MULTIVITAMIN WITH MINERALS) TABS tablet Take 1 tablet by mouth daily.     olmesartan-hydrochlorothiazide (BENICAR HCT) 40-25 MG tablet Take 1 tablet by mouth daily.  3   predniSONE (DELTASONE) 20 MG tablet Take 2 tablets (40 mg total) by mouth daily. 10 tablet 0   No current facility-administered medications for this visit.    REVIEW OF SYSTEMS:   Constitutional: Denies fevers, chills or abnormal night sweats Eyes: Denies blurriness of vision, double vision or watery eyes Ears, nose, mouth, throat, and face: Denies mucositis or sore throat Respiratory: Denies cough, dyspnea or wheezes Cardiovascular: Denies  palpitation, chest discomfort or lower extremity swelling Gastrointestinal:  Denies nausea, heartburn or change in bowel habits Skin: Denies abnormal skin rashes Lymphatics: Denies new lymphadenopathy or easy bruising Neurological:Denies numbness, tingling or new weaknesses Behavioral/Psych: Mood is stable, no new changes  Breast: Denies any palpable lumps or discharge All other systems were reviewed with the patient and are negative.  PHYSICAL EXAMINATION: ECOG PERFORMANCE  STATUS: 0 - Asymptomatic  There were no vitals filed for this visit.   There were no vitals filed for this visit. Physical exam not done, telephone visit LABORATORY DATA:  I have reviewed the data as listed Lab Results  Component Value Date   WBC 4.9 10/25/2022   HGB 11.2 (L) 10/25/2022   HCT 36.5 10/25/2022   MCV 95.1 10/25/2022   PLT 215 10/25/2022   Lab Results  Component Value Date   NA 134 (L) 10/25/2022   K 4.1 10/25/2022   CL 102 10/25/2022   CO2 21 (L) 10/25/2022      RADIOGRAPHIC STUDIES: I have personally reviewed the radiological reports and agreed with the findings in the report.  ASSESSMENT AND PLAN:   Ductal carcinoma in situ (DCIS) of right breast This is a very pleasant 77 year old female patient with right breast DCIS, ER/PR positive status post right breast lumpectomy with reexcision, final pathology showing DCIS with close margin, prior prognostic showed ER/PR positive DCIS s/p adjuvant radiation.  She could not tolerate anastrozole, had severe insomnia and arthralgias and switch to exemestane.  She took it for about a week and also had an episode of gout at the same time when she was started on allopurinol.  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  Allopurinol and exemestane both were held.    Since SJS is more common with allopurinol, we have asked her to resume  exemestane and this is a telephone follow-up to see how she is doing.  She still has some persistent rash which has been slowly getting better but so far she has not had any issues since starting back on exemestane.  Hence have encouraged her to continue and return to clinic to follow-up with me in about 6 months or sooner as needed.  She expressed understanding.   I connected with  Theresa Shannon on 12/17/22 by a telephone application and verified that I am speaking with the correct person using two identifiers.   I discussed the limitations of evaluation and management by telemedicine. The patient expressed understanding and agreed to proceed.   All questions were answered. The patient knows to call the clinic with any problems, questions or concerns.    Rachel Moulds, MD 12/17/22

## 2022-12-17 NOTE — Assessment & Plan Note (Signed)
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  She could not tolerate anastrozole, had severe insomnia and arthralgias and switch to exemestane.  She took it for about a week and also had an episode of gout at the same time when she was started on allopurinol.  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  Allopurinol and exemestane both were held.    Since SJS is more common with allopurinol, we have asked her to resume exemestane and this is a telephone follow-up to see how she is doing.  She still has some persistent rash which has been slowly getting better but so far she has not had any issues since starting back on exemestane.  Hence have encouraged her to continue and return to clinic to follow-up with me in about 6 months or sooner as needed.  She expressed understanding.

## 2022-12-18 ENCOUNTER — Telehealth: Payer: Self-pay | Admitting: Hematology and Oncology

## 2022-12-18 NOTE — Telephone Encounter (Signed)
Left patient a vm regarding upcoming appointment  

## 2022-12-24 ENCOUNTER — Inpatient Hospital Stay: Payer: Federal, State, Local not specified - PPO | Admitting: Hematology and Oncology

## 2023-01-07 ENCOUNTER — Other Ambulatory Visit: Payer: Self-pay | Admitting: Hematology and Oncology

## 2023-03-05 ENCOUNTER — Inpatient Hospital Stay
Payer: Federal, State, Local not specified - PPO | Attending: Hematology and Oncology | Admitting: Hematology and Oncology

## 2023-03-05 VITALS — BP 140/43 | HR 57 | Temp 98.1°F | Resp 18 | Ht 63.0 in | Wt 176.8 lb

## 2023-03-05 DIAGNOSIS — E049 Nontoxic goiter, unspecified: Secondary | ICD-10-CM | POA: Insufficient documentation

## 2023-03-05 DIAGNOSIS — E1122 Type 2 diabetes mellitus with diabetic chronic kidney disease: Secondary | ICD-10-CM | POA: Insufficient documentation

## 2023-03-05 DIAGNOSIS — Z7982 Long term (current) use of aspirin: Secondary | ICD-10-CM | POA: Insufficient documentation

## 2023-03-05 DIAGNOSIS — F0390 Unspecified dementia without behavioral disturbance: Secondary | ICD-10-CM | POA: Diagnosis not present

## 2023-03-05 DIAGNOSIS — Z7952 Long term (current) use of systemic steroids: Secondary | ICD-10-CM | POA: Diagnosis not present

## 2023-03-05 DIAGNOSIS — Z7984 Long term (current) use of oral hypoglycemic drugs: Secondary | ICD-10-CM | POA: Diagnosis not present

## 2023-03-05 DIAGNOSIS — E785 Hyperlipidemia, unspecified: Secondary | ICD-10-CM | POA: Diagnosis not present

## 2023-03-05 DIAGNOSIS — E039 Hypothyroidism, unspecified: Secondary | ICD-10-CM | POA: Diagnosis not present

## 2023-03-05 DIAGNOSIS — N183 Chronic kidney disease, stage 3 unspecified: Secondary | ICD-10-CM | POA: Diagnosis not present

## 2023-03-05 DIAGNOSIS — Z79890 Hormone replacement therapy (postmenopausal): Secondary | ICD-10-CM | POA: Diagnosis not present

## 2023-03-05 DIAGNOSIS — E119 Type 2 diabetes mellitus without complications: Secondary | ICD-10-CM | POA: Insufficient documentation

## 2023-03-05 DIAGNOSIS — I129 Hypertensive chronic kidney disease with stage 1 through stage 4 chronic kidney disease, or unspecified chronic kidney disease: Secondary | ICD-10-CM | POA: Diagnosis not present

## 2023-03-05 DIAGNOSIS — Z170 Estrogen receptor positive status [ER+]: Secondary | ICD-10-CM | POA: Diagnosis not present

## 2023-03-05 DIAGNOSIS — D0511 Intraductal carcinoma in situ of right breast: Secondary | ICD-10-CM | POA: Insufficient documentation

## 2023-03-05 DIAGNOSIS — Z8673 Personal history of transient ischemic attack (TIA), and cerebral infarction without residual deficits: Secondary | ICD-10-CM | POA: Insufficient documentation

## 2023-03-05 DIAGNOSIS — Z79811 Long term (current) use of aromatase inhibitors: Secondary | ICD-10-CM | POA: Insufficient documentation

## 2023-03-05 DIAGNOSIS — Z79899 Other long term (current) drug therapy: Secondary | ICD-10-CM | POA: Diagnosis not present

## 2023-03-05 DIAGNOSIS — Z87442 Personal history of urinary calculi: Secondary | ICD-10-CM | POA: Insufficient documentation

## 2023-03-05 DIAGNOSIS — M109 Gout, unspecified: Secondary | ICD-10-CM | POA: Insufficient documentation

## 2023-03-05 DIAGNOSIS — E1151 Type 2 diabetes mellitus with diabetic peripheral angiopathy without gangrene: Secondary | ICD-10-CM | POA: Diagnosis not present

## 2023-03-05 DIAGNOSIS — K219 Gastro-esophageal reflux disease without esophagitis: Secondary | ICD-10-CM | POA: Insufficient documentation

## 2023-03-05 NOTE — Assessment & Plan Note (Addendum)
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  She could not tolerate anastrozole, had severe insomnia and arthralgias and switch to exemestane.  She is tolerating exemestane very well.  She had Stevens-Johnson syndrome with allopurinol.  She has been recovering well.  She had mammogram in May which did not show any evidence of malignancy.  No obvious changes on physical exam.  She will continue with exemestane and return to clinic in approximately 6 months.  With regards to hair loss, I wonder if she has some telogen effluvium especially with the recent hospitalization and ongoing social stressors.  She also has a Armed forces operational officer and she is following up with them in October.  In the interim she can consider taking a hair vitamin, consider working on the stressors.  With regards to the dementia that she is very worried about, I encouraged her to learn a new skill, musical instrument, language, do brain puzzles etc.

## 2023-03-05 NOTE — Progress Notes (Signed)
[facility] CONSULT NOTE  Patient Care Team: Geoffry Paradise, MD as PCP - General (Internal Medicine) Emelia Loron, MD as Consulting Physician (General Surgery) Rachel Moulds, MD as Consulting Physician (Hematology and Oncology) Antony Blackbird, MD as Consulting Physician (Radiation Oncology)  CHIEF COMPLAINTS/PURPOSE OF CONSULTATION:  DCIS  HISTORY OF PRESENTING ILLNESS:  Theresa Shannon 77 y.o. female is here because of recent diagnosis of right breast DCIS  I reviewed her records extensively and collaborated the history with the patient.  SUMMARY OF ONCOLOGIC HISTORY: Oncology History  Ductal carcinoma in situ (DCIS) of right breast  10/15/2021 Mammogram   Digital screening bilateral mammogram showed indeterminate calcifications in the right breast.   10/31/2021 Pathology Results   Pathology from the breast biopsy showed low-grade DCIS with calcifications rest of the needle core biopsy showed fibrocystic changes with usual ductal hyperplasia and vascular calcifications ER 100% positive strong staining, PR 95% positive strong staining   11/12/2021 Initial Diagnosis   Ductal carcinoma in situ (DCIS) of right breast   11/14/2021 Cancer Staging   Staging form: Breast, AJCC 8th Edition - Clinical stage from 11/14/2021: Stage 0 (cTis (DCIS), cN0, cM0, ER+, PR+, HER2: Not Assessed) - Signed by Rachel Moulds, MD on 11/14/2021 Stage prefix: Initial diagnosis Nuclear grade: G1 Laterality: Right Staged by: Pathologist and managing physician Stage used in treatment planning: Yes National guidelines used in treatment planning: Yes Type of national guideline used in treatment planning: NCCN    Genetic Testing   [facility] CustomNext Panel was Negative. Report date is 12/05/2021.  The CustomNext-Cancer+RNAinsight panel offered by [employer] includes sequencing and rearrangement analysis for the following 52 genes :  APC, ATM, AXIN2, BAP1, BARD1, BMPR1A, BRCA1, BRCA2,  BRIP1, CDH1, CDK4, CDKN2A, CHEK2, CTNNA1, DICER1, EPCAM, FH, FLCN, GREM1, HOXB13, KIT, MEN1, MET, MITF, MLH1, MSH2, MSH3, MSH6, MUTYH, NBN, NF1, NTHL1, PALB2, PDGFRA, PMS2, POLD1, POLE, PTEN, RAD50, RAD51C, RAD51D, SDHA, SDHB, SDHC, SDHD, SMAD4, SMARCA4, STK11, TP53, TSC1, TSC2, and VHL.  RNA data is routinely analyzed for use in variant interpretation for all genes.    12/28/2021 Surgery   Right breast lumpectomy initially done on 518 showed DCIS with calcifications partially involving a fibrotic papillary lesion, focally less than 1 mm from final medial margin.  She underwent reexcision and this once again showed low-grade DCIS measuring 1 cm again 1 mm from the final medial margin fibrosis and inflammation consistent with previous lumpectomy. Prior prognostic showed ER 100% positive strong staining and PR 95% positive strong staining   02/05/2022 - 03/06/2022 Radiation Therapy   Site Technique Total Dose (Gy) Dose per Fx (Gy) Completed Fx Beam Energies  Breast, Right: Breast_R 3D 40.05/40.05 2.67 15/15 6XFFF  Breast, Right: Breast_R_Bst 3D 12/12 2 6/6 6X, 10X     03/2022 -  Anti-estrogen oral therapy   Anastrozole    She had allopurinol induced SJS.  Since her last visit here, she has been doing well on exemestane.  She only reports hair loss, wonders if this is from the skin rash she had versus ongoing stressors.  She denies any major complaints with exemestane today.  Last mammogram in May with no concerns for malignancy.  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   Chronic kidney disease  hypertensive chronic kideny disease benign/ stage 3   Diabetes mellitus, type 2 (HCC)    Esophagitis determined by endoscopy 03/2015   GERD (gastroesophageal reflux disease)    Gout 04/2015   L toe   History of  frequent urinary tract infections    History of hiatal hernia    History of kidney stones    HTN (hypertension)    Hyperlipemia    Hypertension    Hypothyroidism    Peripheral neuropathy    Peripheral vascular disease (HCC)    Spondylolisthesis of lumbar region    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; right eye    Thyroid goiter    multinodular    Wears glasses     SURGICAL HISTORY: Past Surgical History:  Procedure Laterality Date   ABDOMINAL HYSTERECTOMY     ABSCESS DRAINAGE  07/08/1993   breast   ANTERIOR LAT LUMBAR FUSION N/A 02/02/2018   Procedure: Anterolateral decompression Lumbar Four-Five with xlif percutaneous fixation with robotic placement of screws Lumbar four-five;  Surgeon: Barnett Abu, MD;  Location: [facility] OR;  Service: Neurosurgery;  Laterality: N/A;   APPENDECTOMY     APPLICATION OF ROBOTIC ASSISTANCE FOR SPINAL PROCEDURE N/A 02/02/2018   Procedure: APPLICATION OF ROBOTIC ASSISTANCE FOR SPINAL PROCEDURE;  Surgeon: Barnett Abu, MD;  Location: [facility] OR;  Service: Neurosurgery;  Laterality: N/A;   BACK SURGERY     BREAST BIOPSY     BREAST CYST EXCISION  02/26/2012   Procedure: CYST EXCISION BREAST;  Surgeon: Shelly Rubenstein, MD;  Location: [facility];  Service: General;  Laterality: Left;  excision chronic cyst left breast   BREAST LUMPECTOMY WITH RADIOACTIVE SEED LOCALIZATION Right 11/22/2021   Procedure: RIGHT BREAST LUMPECTOMY WITH RADIOACTIVE SEED LOCALIZATION;  Surgeon: Emelia Loron, MD;  Location[facility] OR;  Service: General;  Laterality: Right;   BUNIONECTOMY     LEFT   CHOLECYSTECTOMY N/A 08/17/2015   Procedure: LAPAROSCOPIC CHOLECYSTECTOMY WITH INTRAOPERATIVE CHOLANGIOGRAM;  Surgeon: Darnell Level, MD;  Location: [facility] ORS;  Service: General;  Laterality: N/A;   DILATION AND CURETTAGE OF UTERUS     KNEE SURGERY     BILATERAL   LUMBAR PERCUTANEOUS PEDICLE SCREW 1 LEVEL N/A 02/02/2018   Procedure: LUMBAR PERCUTANEOUS PEDICLE SCREW LUMBAR  FOUR-FIVE;  Surgeon: Barnett Abu, MD;  Location: [facility] OR;  Service: Neurosurgery;  Laterality: N/A;   RE-EXCISION OF BREAST LUMPECTOMY Right 12/28/2021   Procedure: RE-EXCISION OF RIGHT BREAST MARGIN;  Surgeon: Emelia Loron, MD;  Location: [facility] OR[facility];  Service: General;  Laterality: Right;   TEMPORAL ARTERY BIOPSY / LIGATION     TONSILLECTOMY AND ADENOIDECTOMY     TUBAL LIGATION     TUMOR REMOVAL     LEFT ARM-lipoma    SOCIAL HISTORY: Social History   Socioeconomic History   Marital status: Married    Spouse name: Dorinda Hill   Number of children: 1   Years of education: 12th   Highest education level: Not on file  Occupational History   Occupation: Retired  Tobacco Use   Smoking status: Never   Smokeless tobacco: Never  Vaping Use   Vaping status: Never Used  Substance and Sexual Activity   Alcohol use: No   Drug use: No   Sexual activity: Not on file  Other Topics Concern   Not on file  Social History Narrative   Patient lives at home with husband Dorinda Hill.    Patient has 1 child.    Patient is retired.  Patient has a high school education.    Social Determinants of Health   Financial Resource Strain: Low Risk  (11/14/2021)   Overall Financial Resource Strain (CARDIA)    Difficulty of Paying Living Expenses: Not very hard  Food Insecurity: No Food Insecurity (11/14/2021)   Hunger Vital Sign    Worried About Running Out of Food in the Last Year: Never true    Ran Out of Food in the Last Year: Never true  Transportation Needs: No Transportation Needs (11/14/2021)   PRAPARE - Administrator, Civil Service (Medical): No    Lack of Transportation (Non-Medical): No  Physical Activity: Not on file  Stress: Not on file  Social Connections: Not on file  Intimate Partner Violence: Not on file    FAMILY HISTORY: Family History  Problem Relation Age of Onset   Dementia Mother    Kidney disease Mother    Cancer Mother    Diabetes Mother    Breast cancer Mother  50   Dementia Father    Heart disease Father    Hypertension Father    Stroke Father    Breast cancer Sister 18   Kidney cancer Sister 52   Leukemia Maternal Aunt    Pancreatic cancer Paternal Aunt     ALLERGIES:  is allergic to amlodipine, oxycodone-acetaminophen, oxaprozin, tyloxapol, and other.  MEDICATIONS:  Current Outpatient Medications  Medication Sig Dispense Refill   acetaminophen (TYLENOL) 325 MG tablet Take 650 mg by mouth every 6 (six) hours as needed for mild pain.     aspirin 81 MG chewable tablet Chew 81 mg by mouth daily.     atenolol (TENORMIN) 50 MG tablet Take 1 tablet (50 mg total) by mouth daily. 90 tablet 4   cholecalciferol (VITAMIN D) 1000 units tablet Take 1,000 Units by mouth daily.     exemestane (AROMASIN) 25 MG tablet TAKE 1 TABLET(25 MG) BY MOUTH DAILY AFTER BREAKFAST. 90 tablet 1   fenofibrate (TRICOR) 145 MG tablet Take 1 tablet (145 mg total) by mouth daily. Please make overdue yearly appt with Dr. Elease Hashimoto before anymore refills. 2nd attempt 15 tablet 0   JARDIANCE 25 MG TABS tablet Take 25 mg by mouth daily.     levothyroxine (SYNTHROID) 150 MCG tablet Take 150 mcg by mouth every morning.     metFORMIN (GLUCOPHAGE-XR) 500 MG 24 hr tablet Take 500 mg by mouth 2 (two) times daily.     Multiple Vitamin (MULTIVITAMIN WITH MINERALS) TABS tablet Take 1 tablet by mouth daily.     olmesartan-hydrochlorothiazide (BENICAR HCT) 40-25 MG tablet Take 1 tablet by mouth daily.  3   predniSONE (DELTASONE) 20 MG tablet Take 2 tablets (40 mg total) by mouth daily. 10 tablet 0   No current facility-administered medications for this visit.    REVIEW OF SYSTEMS:   Constitutional: Denies fevers, chills or abnormal night sweats Eyes: Denies blurriness of vision, double vision or watery eyes Ears, nose, mouth, throat, and face: Denies mucositis or sore throat Respiratory: Denies cough, dyspnea or wheezes Cardiovascular: Denies palpitation, chest discomfort or lower  extremity swelling Gastrointestinal:  Denies nausea, heartburn or change in bowel habits Skin: Denies abnormal skin rashes Lymphatics: Denies new lymphadenopathy or easy bruising Neurological:Denies numbness, tingling or new weaknesses Behavioral/Psych: Mood is stable, no new changes  Breast: Denies any palpable lumps or discharge All other systems were reviewed with the patient and are negative.  PHYSICAL EXAMINATION: ECOG PERFORMANCE STATUS: 0 - Asymptomatic  Vitals:  03/05/23 1341  BP: (!) 140/43  Pulse: (!) 57  Resp: 18  Temp: 98.1 F (36.7 C)  SpO2: 98%     Filed Weights   03/05/23 1341  Weight: 176 lb 12.8 oz (80.2 kg)   Physical Exam Constitutional:      Appearance: Normal appearance.  Chest:     Comments: Postsurgical changes noted in the right breast.  No palpable masses or regional adenopathy concerning for recurrence. Musculoskeletal:     Cervical back: Normal range of motion and neck supple. No rigidity.  Lymphadenopathy:     Cervical: No cervical adenopathy.  Neurological:     Mental Status: She is alert.      LABORATORY DATA:  I have reviewed the data as listed Lab Results  Component Value Date   WBC 4.9 10/25/2022   HGB 11.2 (L) 10/25/2022   HCT 36.5 10/25/2022   MCV 95.1 10/25/2022   PLT 215 10/25/2022   Lab Results  Component Value Date   NA 134 (L) 10/25/2022   K 4.1 10/25/2022   CL 102 10/25/2022   CO2 21 (L) 10/25/2022      RADIOGRAPHIC STUDIES: I have personally reviewed the radiological reports and agreed with the findings in the report.  ASSESSMENT AND PLAN:   Ductal carcinoma in situ (DCIS) of right breast This is a very pleasant 77 year old female patient with right breast DCIS, ER/PR positive status post right breast lumpectomy with reexcision, final pathology showing DCIS with close margin, prior prognostic showed ER/PR positive DCIS s/p adjuvant radiation.  She could not tolerate anastrozole, had severe insomnia and  arthralgias and switch to exemestane.  She is tolerating exemestane very well.  She had Stevens-Johnson syndrome with allopurinol.  She has been recovering well.  She had mammogram in May which did not show any evidence of malignancy.  No obvious changes on physical exam.  She will continue with exemestane and return to clinic in approximately 6 months.  With regards to hair loss, I wonder if she has some telogen effluvium especially with the recent hospitalization and ongoing social stressors.  She also has a Armed forces operational officer and she is following up with them in October.  In the interim she can consider taking a hair vitamin, consider working on the stressors.  With regards to the dementia that she is very worried about, I encouraged her to learn a new skill, musical instrument, language, do brain puzzles etc.    All questions were answered. The patient knows to call the clinic with any problems, questions or concerns.    Rachel Moulds, MD 03/05/23

## 2023-03-26 ENCOUNTER — Ambulatory Visit: Payer: Federal, State, Local not specified - PPO | Admitting: Hematology and Oncology

## 2023-03-27 ENCOUNTER — Ambulatory Visit
Admission: RE | Admit: 2023-03-27 | Discharge: 2023-03-27 | Disposition: A | Discharge status: Home / Self Care | Payer: Federal, State, Local not specified - PPO | Source: Ambulatory Visit | Attending: Hematology and Oncology | Admitting: Hematology and Oncology

## 2023-03-27 DIAGNOSIS — D0511 Intraductal carcinoma in situ of right breast: Secondary | ICD-10-CM

## 2023-06-19 ENCOUNTER — Ambulatory Visit: Payer: Federal, State, Local not specified - PPO | Admitting: Hematology and Oncology

## 2023-08-11 ENCOUNTER — Inpatient Hospital Stay
Payer: Federal, State, Local not specified - PPO | Attending: Hematology and Oncology | Admitting: Hematology and Oncology

## 2023-08-11 VITALS — BP 134/51 | HR 54 | Temp 98.4°F | Resp 17 | Wt 172.3 lb

## 2023-08-11 DIAGNOSIS — Z803 Family history of malignant neoplasm of breast: Secondary | ICD-10-CM | POA: Diagnosis not present

## 2023-08-11 DIAGNOSIS — Z79811 Long term (current) use of aromatase inhibitors: Secondary | ICD-10-CM | POA: Insufficient documentation

## 2023-08-11 DIAGNOSIS — Z170 Estrogen receptor positive status [ER+]: Secondary | ICD-10-CM | POA: Diagnosis not present

## 2023-08-11 DIAGNOSIS — M47812 Spondylosis without myelopathy or radiculopathy, cervical region: Secondary | ICD-10-CM | POA: Diagnosis not present

## 2023-08-11 DIAGNOSIS — Z809 Family history of malignant neoplasm, unspecified: Secondary | ICD-10-CM | POA: Diagnosis not present

## 2023-08-11 DIAGNOSIS — I129 Hypertensive chronic kidney disease with stage 1 through stage 4 chronic kidney disease, or unspecified chronic kidney disease: Secondary | ICD-10-CM | POA: Diagnosis not present

## 2023-08-11 DIAGNOSIS — Z87440 Personal history of urinary (tract) infections: Secondary | ICD-10-CM | POA: Insufficient documentation

## 2023-08-11 DIAGNOSIS — M810 Age-related osteoporosis without current pathological fracture: Secondary | ICD-10-CM | POA: Diagnosis not present

## 2023-08-11 DIAGNOSIS — Z7982 Long term (current) use of aspirin: Secondary | ICD-10-CM | POA: Insufficient documentation

## 2023-08-11 DIAGNOSIS — Z79899 Other long term (current) drug therapy: Secondary | ICD-10-CM | POA: Insufficient documentation

## 2023-08-11 DIAGNOSIS — R197 Diarrhea, unspecified: Secondary | ICD-10-CM | POA: Insufficient documentation

## 2023-08-11 DIAGNOSIS — D0511 Intraductal carcinoma in situ of right breast: Secondary | ICD-10-CM | POA: Insufficient documentation

## 2023-08-11 DIAGNOSIS — Z7984 Long term (current) use of oral hypoglycemic drugs: Secondary | ICD-10-CM | POA: Diagnosis not present

## 2023-08-11 DIAGNOSIS — E1122 Type 2 diabetes mellitus with diabetic chronic kidney disease: Secondary | ICD-10-CM | POA: Diagnosis not present

## 2023-08-11 DIAGNOSIS — Z87442 Personal history of urinary calculi: Secondary | ICD-10-CM | POA: Insufficient documentation

## 2023-08-11 DIAGNOSIS — Z8673 Personal history of transient ischemic attack (TIA), and cerebral infarction without residual deficits: Secondary | ICD-10-CM | POA: Insufficient documentation

## 2023-08-11 DIAGNOSIS — Z923 Personal history of irradiation: Secondary | ICD-10-CM | POA: Diagnosis not present

## 2023-08-11 DIAGNOSIS — R21 Rash and other nonspecific skin eruption: Secondary | ICD-10-CM | POA: Diagnosis not present

## 2023-08-11 DIAGNOSIS — N189 Chronic kidney disease, unspecified: Secondary | ICD-10-CM | POA: Insufficient documentation

## 2023-08-11 NOTE — Assessment & Plan Note (Signed)
Breast Cancer On exemestane. No new lumps or changes in breast tissue noted on exam. -Continue exemestane as prescribed. -Order mammogram for May 2025.  Skin Rash Resolved with some residual spots that do not bother the patient. -No further action required at this time.  Diarrhea Previously severe, now resolved after consultation with gastroenterologist. -Continue current management as directed by gastroenterologist.  Osteoporosis Bone density scan in September 2024 showed strong bones for age. -Encourage continued weight-bearing activities such as walking.  Hair Thinning Ongoing, possibly related to previous COVID infection and skin rash. New hair growth noted. -Continue current management and monitor for changes.  General Health Maintenance -Follow-up in six months or sooner if any changes occur.

## 2023-08-11 NOTE — Progress Notes (Signed)
[facility] CONSULT NOTE  Patient Care Team: Geoffry Paradise, MD as PCP - General (Internal Medicine) Emelia Loron, MD as Consulting Physician (General Surgery) Rachel Moulds, MD as Consulting Physician (Hematology and Oncology) Antony Blackbird, MD as Consulting Physician (Radiation Oncology)  CHIEF COMPLAINTS/PURPOSE OF CONSULTATION:  DCIS  HISTORY OF PRESENTING ILLNESS:  Theresa Shannon 78 y.o. female is here because of recent diagnosis of right breast DCIS  I reviewed her records extensively and collaborated the history with the patient.  SUMMARY OF ONCOLOGIC HISTORY: Oncology History  Ductal carcinoma in situ (DCIS) of right breast  10/15/2021 Mammogram   Digital screening bilateral mammogram showed indeterminate calcifications in the right breast.   10/31/2021 Pathology Results   Pathology from the breast biopsy showed low-grade DCIS with calcifications rest of the needle core biopsy showed fibrocystic changes with usual ductal hyperplasia and vascular calcifications ER 100% positive strong staining, PR 95% positive strong staining   11/12/2021 Initial Diagnosis   Ductal carcinoma in situ (DCIS) of right breast   11/14/2021 Cancer Staging   Staging form: Breast, AJCC 8th Edition - Clinical stage from 11/14/2021: Stage 0 (cTis (DCIS), cN0, cM0, ER+, PR+, HER2: Not Assessed) - Signed by Rachel Moulds, MD on 11/14/2021 Stage prefix: Initial diagnosis Nuclear grade: G1 Laterality: Right Staged by: Pathologist and managing physician Stage used in treatment planning: Yes National guidelines used in treatment planning: Yes Type of national guideline used in treatment planning: NCCN    Genetic Testing   [facility] CustomNext Panel was Negative. Report date is 12/05/2021.  The CustomNext-Cancer+RNAinsight panel offered by [employer] [facility] includes sequencing and rearrangement analysis for the following 52 genes :  APC, ATM, AXIN2, BAP1, BARD1, BMPR1A, BRCA1, BRCA2,  BRIP1, CDH1, CDK4, CDKN2A, CHEK2, CTNNA1, DICER1, EPCAM, FH, FLCN, GREM1, HOXB13, KIT, MEN1, MET, MITF, MLH1, MSH2, MSH3, MSH6, MUTYH, NBN, NF1, NTHL1, PALB2, PDGFRA, PMS2, POLD1, POLE, PTEN, RAD50, RAD51C, RAD51D, SDHA, SDHB, SDHC, SDHD, SMAD4, SMARCA4, STK11, TP53, TSC1, TSC2, and VHL.  RNA data is routinely analyzed for use in variant interpretation for all genes.    12/28/2021 Surgery   Right breast lumpectomy initially done on 518 showed DCIS with calcifications partially involving a fibrotic papillary lesion, focally less than 1 mm from final medial margin.  She underwent reexcision and this once again showed low-grade DCIS measuring 1 cm again 1 mm from the final medial margin fibrosis and inflammation consistent with previous lumpectomy. 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 Anyway after she recovered from the SJS, she was switched to exemestane.     The patient is a 78 year old female who presents for a follow-up visit.  She has a history of a skin rash that has mostly resolved, with only a couple of small areas remaining that do not bother her. She was previously taking prednisone for the rash but has since discontinued it.  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 She is planning to have knee surgery once it is arranged.  She has experienced hair thinning, which she attributes to a previous rash and having COVID-19 at the beginning of the pandemic. Her granddaughter, a hairdresser, has  observed new hair growth.  She had severe diarrhea in the past, requiring frequent bathroom visits, but this has improved after seeing a gastroenterologist. She takes medication for this issue as needed. Her bowel movements are now regular.  She is currently taking metformin, atenolol, Jardiance, levothyroxine, vitamin D, Benicar, fenofibrate, and axonastine. She also takes furosemide and hydrochloroquine for swelling, which she notes never completely resolves.  She mentions carrying her great-grandchildren, which she believes helps maintain her bone density. She used to walk a lot but finds it difficult now due to arthritis.  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   Chronic kidney disease    hypertensive chronic kideny disease benign/ stage 3   Diabetes mellitus, type 2 (HCC)    Esophagitis determined by endoscopy 03/2015   GERD (gastroesophageal reflux disease)    Gout 04/2015   L toe   History of frequent urinary tract infections    History of hiatal hernia    History of kidney stones    HTN (hypertension)    Hyperlipemia    Hypertension    Hypothyroidism    Peripheral neuropathy    Peripheral vascular disease (HCC)    Spondylolisthesis of lumbar region    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; right eye    Thyroid goiter    multinodular    Wears glasses     SURGICAL HISTORY: Past Surgical History:  Procedure Laterality Date   ABDOMINAL HYSTERECTOMY     ABSCESS DRAINAGE  07/08/1993   breast   ANTERIOR LAT LUMBAR FUSION N/A 02/02/2018   Procedure: Anterolateral decompression Lumbar Four-Five with xlif percutaneous fixation with robotic placement of screws Lumbar four-five;  Surgeon: Barnett Abu, MD;  Location: [facility] OR;  Service: Neurosurgery;  Laterality: N/A;   APPENDECTOMY      APPLICATION OF ROBOTIC ASSISTANCE FOR SPINAL PROCEDURE N/A 02/02/2018   Procedure: APPLICATION OF ROBOTIC ASSISTANCE FOR SPINAL PROCEDURE;  Surgeon: Barnett Abu, MD;  Location: [facility] OR;  Service: Neurosurgery;  Laterality: N/A;   BACK SURGERY     BREAST BIOPSY     BREAST CYST EXCISION  02/26/2012   Procedure: CYST EXCISION BREAST;  Surgeon: Shelly Rubenstein, MD;  Location: [facility];  Service: General;  Laterality: Left;  excision chronic cyst left breast   BREAST LUMPECTOMY WITH RADIOACTIVE SEED LOCALIZATION Right 11/22/2021   Procedure: RIGHT BREAST LUMPECTOMY WITH RADIOACTIVE SEED LOCALIZATION;  Surgeon: Emelia Loron, MD;  Location: [facility];  Service: General;  Laterality: Right;   BUNIONECTOMY     LEFT   CHOLECYSTECTOMY N/A 08/17/2015   Procedure: LAPAROSCOPIC CHOLECYSTECTOMY WITH INTRAOPERATIVE CHOLANGIOGRAM;  Surgeon: Darnell Level, MD;  Location: [facility];  Service: General;  Laterality: N/A;   DILATION AND CURETTAGE OF UTERUS     KNEE SURGERY     BILATERAL   LUMBAR PERCUTANEOUS PEDICLE SCREW 1 LEVEL N/A 02/02/2018   Procedure: LUMBAR PERCUTANEOUS PEDICLE SCREW LUMBAR FOUR-FIVE;  Surgeon: Barnett Abu, MD;  Location: [facility] OR;  Service: Neurosurgery;  Laterality: N/A;   RE-EXCISION OF BREAST LUMPECTOMY Right 12/28/2021   Procedure: RE-EXCISION OF RIGHT  BREAST MARGIN;  Surgeon: Emelia Loron, MD;  Location: [facility];  Service: General;  Laterality: Right;   TEMPORAL ARTERY BIOPSY / LIGATION     TONSILLECTOMY AND ADENOIDECTOMY     TUBAL LIGATION     TUMOR REMOVAL     LEFT ARM-lipoma    SOCIAL HISTORY: Social History   Socioeconomic History   Marital status: Married    Spouse name: Dorinda Hill   Number of children: 1   Years of education: 12th   Highest education level: Not on file  Occupational History   Occupation: Retired  Tobacco Use   Smoking status: Never   Smokeless tobacco: Never  Vaping Use   Vaping status: Never Used  Substance and Sexual Activity    Alcohol use: No   Drug use: No   Sexual activity: Not on file  Other Topics Concern   Not on file  Social History Narrative   Patient lives at home with husband Dorinda Hill.    Patient has 1 child.    Patient is retired.    Patient has a high school education.    Social Drivers of Corporate investment banker Strain: Low Risk  (11/14/2021)   Overall Financial Resource Strain (CARDIA)    Difficulty of Paying Living Expenses: Not very hard  Food Insecurity: No Food Insecurity (11/14/2021)   Hunger Vital Sign    Worried About Running Out of Food in the Last Year: Never true    Ran Out of Food in the Last Year: Never true  Transportation Needs: No Transportation Needs (11/14/2021)   PRAPARE - Administrator, Civil Service (Medical): No    Lack of Transportation (Non-Medical): No  Physical Activity: Not on file  Stress: Not on file  Social Connections: Not on file  Intimate Partner Violence: Not on file    FAMILY HISTORY: Family History  Problem Relation Age of Onset   Dementia Mother    Kidney disease Mother    Cancer Mother    Diabetes Mother    Breast cancer Mother 35   Dementia Father    Heart disease Father    Hypertension Father    Stroke Father    Breast cancer Sister 15   Kidney cancer Sister 11   Leukemia Maternal Aunt    Pancreatic cancer Paternal Aunt     ALLERGIES:  is allergic to allopurinol, amlodipine, oxycodone-acetaminophen, oxaprozin, tyloxapol, and other.  MEDICATIONS:  Current Outpatient Medications  Medication Sig Dispense Refill   acetaminophen (TYLENOL) 325 MG tablet Take 650 mg by mouth every 6 (six) hours as needed for mild pain.     aspirin 81 MG chewable tablet Chew 81 mg by mouth daily.     atenolol (TENORMIN) 50 MG tablet Take 1 tablet (50 mg total) by mouth daily. 90 tablet 4   cholecalciferol (VITAMIN D) 1000 units tablet Take 1,000 Units by mouth daily.     exemestane (AROMASIN) 25 MG tablet TAKE 1 TABLET(25 MG) BY MOUTH DAILY  AFTER BREAKFAST. 90 tablet 1   fenofibrate (TRICOR) 145 MG tablet Take 1 tablet (145 mg total) by mouth daily. Please make overdue yearly appt with Dr. Elease Hashimoto before anymore refills. 2nd attempt 15 tablet 0   JARDIANCE 25 MG TABS tablet Take 25 mg by mouth daily.     levothyroxine (SYNTHROID) 150 MCG tablet Take 150 mcg by mouth every morning.     metFORMIN (GLUCOPHAGE-XR) 500 MG 24 hr tablet Take 500 mg by mouth 2 (two) times daily.  Multiple Vitamin (MULTIVITAMIN WITH MINERALS) TABS tablet Take 1 tablet by mouth daily.     olmesartan-hydrochlorothiazide (BENICAR HCT) 40-25 MG tablet Take 1 tablet by mouth daily.  3   No current facility-administered medications for this visit.    REVIEW OF SYSTEMS:   Constitutional: Denies fevers, chills or abnormal night sweats Eyes: Denies blurriness of vision, double vision or watery eyes Ears, nose, mouth, throat, and face: Denies mucositis or sore throat Respiratory: Denies cough, dyspnea or wheezes Cardiovascular: Denies palpitation, chest discomfort or lower extremity swelling Gastrointestinal:  Denies nausea, heartburn or change in bowel habits Skin: Denies abnormal skin rashes Lymphatics: Denies new lymphadenopathy or easy bruising Neurological:Denies numbness, tingling or new weaknesses Behavioral/Psych: Mood is stable, no new changes  Breast: Denies any palpable lumps or discharge All other systems were reviewed with the patient and are negative.  PHYSICAL EXAMINATION: ECOG PERFORMANCE STATUS: 0 - Asymptomatic  Vitals:   08/11/23 0957  BP: (!) 134/51  Pulse: (!) 54  Resp: 17  Temp: 98.4 F (36.9 C)  SpO2: 97%      Filed Weights   08/11/23 0957  Weight: 172 lb 4.8 oz (78.2 kg)    Physical Exam Constitutional:      Appearance: Normal appearance.  Chest:     Comments: Postsurgical changes noted in the right breast.  No palpable masses or regional adenopathy concerning for recurrence. Musculoskeletal:     Cervical back:  Normal range of motion and neck supple. No rigidity.  Lymphadenopathy:     Cervical: No cervical adenopathy.  Neurological:     Mental Status: She is alert.      LABORATORY DATA:  I have reviewed the data as listed Lab Results  Component Value Date   WBC 4.9 10/25/2022   HGB 11.2 (L) 10/25/2022   HCT 36.5 10/25/2022   MCV 95.1 10/25/2022   PLT 215 10/25/2022   Lab Results  Component Value Date   NA 134 (L) 10/25/2022   K 4.1 10/25/2022   CL 102 10/25/2022   CO2 21 (L) 10/25/2022      RADIOGRAPHIC STUDIES: I have personally reviewed the radiological reports and agreed with the findings in the report.  ASSESSMENT AND PLAN:   Ductal carcinoma in situ (DCIS) of right breast Breast Cancer On exemestane. No new lumps or changes in breast tissue noted on exam. -Continue exemestane as prescribed. -Order mammogram for May 2025.  Skin Rash Resolved with some residual spots that do not bother the patient. -No further action required at this time.  Diarrhea Previously severe, now resolved after consultation with gastroenterologist. -Continue current management as directed by gastroenterologist.  Osteoporosis Bone density scan in September 2024 showed strong bones for age. -Encourage continued weight-bearing activities such as walking.  Hair Thinning Ongoing, possibly related to previous COVID infection and skin rash. New hair growth noted. -Continue current management and monitor for changes.  General Health Maintenance -Follow-up in six months or sooner if any changes occur.     All questions were answered. The patient knows to call the clinic with any problems, questions or concerns.    Rachel Moulds, MD 08/11/23

## 2023-11-13 ENCOUNTER — Encounter (HOSPITAL_COMMUNITY): Payer: Self-pay

## 2023-11-20 ENCOUNTER — Ambulatory Visit
Admission: RE | Admit: 2023-11-20 | Discharge: 2023-11-20 | Disposition: A | Discharge status: Home / Self Care | Payer: Federal, State, Local not specified - PPO | Source: Ambulatory Visit | Attending: Hematology and Oncology | Admitting: Hematology and Oncology

## 2023-11-20 DIAGNOSIS — D0511 Intraductal carcinoma in situ of right breast: Secondary | ICD-10-CM

## 2023-11-24 ENCOUNTER — Telehealth: Payer: Self-pay | Admitting: *Deleted

## 2023-11-24 DIAGNOSIS — D0511 Intraductal carcinoma in situ of right breast: Secondary | ICD-10-CM

## 2023-11-24 MED ORDER — EXEMESTANE 25 MG PO TABS
25.0000 mg | ORAL_TABLET | Freq: Every day | ORAL | 1 refills | Status: DC
Start: 2023-11-24 — End: 2024-02-09

## 2023-11-24 NOTE — Telephone Encounter (Signed)
 Patient's husband called to ask about refill for exemestane . He said they'd contacted the pharmacy and were told the pharmacy had sent several requests to the office and had not heard back. Per Dr. Lorella Roles OV note 08/11/2023, patient is to continue exemestane . Next appt is 02/09/24. Contacted [facility], was informed by [employer] that several faxes have been sent to [phone] requesting refills. This number is the fax machine for this office. No faxes have been received at this time.  Requested that [facility] send refill requests via escript. Judeen Nose said she would note the request but could not confirm that a change will occur. Refill sent via escript for med and patient and spouse notified of same.

## 2024-02-06 ENCOUNTER — Telehealth: Payer: Self-pay | Admitting: Hematology and Oncology

## 2024-02-06 NOTE — Telephone Encounter (Signed)
 Pt verbally confirmed her appt for 8/4

## 2024-02-09 ENCOUNTER — Inpatient Hospital Stay
Payer: Federal, State, Local not specified - PPO | Attending: Hematology and Oncology | Admitting: Hematology and Oncology

## 2024-02-09 DIAGNOSIS — R197 Diarrhea, unspecified: Secondary | ICD-10-CM | POA: Diagnosis not present

## 2024-02-09 DIAGNOSIS — M2550 Pain in unspecified joint: Secondary | ICD-10-CM | POA: Insufficient documentation

## 2024-02-09 DIAGNOSIS — Z79890 Hormone replacement therapy (postmenopausal): Secondary | ICD-10-CM | POA: Diagnosis not present

## 2024-02-09 DIAGNOSIS — G629 Polyneuropathy, unspecified: Secondary | ICD-10-CM | POA: Diagnosis not present

## 2024-02-09 DIAGNOSIS — Z79811 Long term (current) use of aromatase inhibitors: Secondary | ICD-10-CM | POA: Insufficient documentation

## 2024-02-09 DIAGNOSIS — Z87442 Personal history of urinary calculi: Secondary | ICD-10-CM | POA: Insufficient documentation

## 2024-02-09 DIAGNOSIS — M7989 Other specified soft tissue disorders: Secondary | ICD-10-CM | POA: Diagnosis not present

## 2024-02-09 DIAGNOSIS — Z7982 Long term (current) use of aspirin: Secondary | ICD-10-CM | POA: Diagnosis not present

## 2024-02-09 DIAGNOSIS — Z8673 Personal history of transient ischemic attack (TIA), and cerebral infarction without residual deficits: Secondary | ICD-10-CM | POA: Insufficient documentation

## 2024-02-09 DIAGNOSIS — E119 Type 2 diabetes mellitus without complications: Secondary | ICD-10-CM | POA: Insufficient documentation

## 2024-02-09 DIAGNOSIS — I739 Peripheral vascular disease, unspecified: Secondary | ICD-10-CM | POA: Diagnosis not present

## 2024-02-09 DIAGNOSIS — I10 Essential (primary) hypertension: Secondary | ICD-10-CM | POA: Insufficient documentation

## 2024-02-09 DIAGNOSIS — N183 Chronic kidney disease, stage 3 unspecified: Secondary | ICD-10-CM | POA: Insufficient documentation

## 2024-02-09 DIAGNOSIS — Z79899 Other long term (current) drug therapy: Secondary | ICD-10-CM | POA: Insufficient documentation

## 2024-02-09 DIAGNOSIS — Z803 Family history of malignant neoplasm of breast: Secondary | ICD-10-CM | POA: Diagnosis not present

## 2024-02-09 DIAGNOSIS — E785 Hyperlipidemia, unspecified: Secondary | ICD-10-CM | POA: Insufficient documentation

## 2024-02-09 DIAGNOSIS — Z8052 Family history of malignant neoplasm of bladder: Secondary | ICD-10-CM | POA: Diagnosis not present

## 2024-02-09 DIAGNOSIS — R600 Localized edema: Secondary | ICD-10-CM | POA: Insufficient documentation

## 2024-02-09 DIAGNOSIS — Z806 Family history of leukemia: Secondary | ICD-10-CM | POA: Insufficient documentation

## 2024-02-09 DIAGNOSIS — Z7984 Long term (current) use of oral hypoglycemic drugs: Secondary | ICD-10-CM | POA: Diagnosis not present

## 2024-02-09 DIAGNOSIS — Z170 Estrogen receptor positive status [ER+]: Secondary | ICD-10-CM | POA: Diagnosis not present

## 2024-02-09 DIAGNOSIS — D0511 Intraductal carcinoma in situ of right breast: Secondary | ICD-10-CM | POA: Insufficient documentation

## 2024-02-09 DIAGNOSIS — K219 Gastro-esophageal reflux disease without esophagitis: Secondary | ICD-10-CM | POA: Insufficient documentation

## 2024-02-09 DIAGNOSIS — E039 Hypothyroidism, unspecified: Secondary | ICD-10-CM | POA: Diagnosis not present

## 2024-02-09 MED ORDER — EXEMESTANE 25 MG PO TABS
25.0000 mg | ORAL_TABLET | Freq: Every day | ORAL | 1 refills | Status: AC
Start: 2024-02-09 — End: ?

## 2024-02-09 NOTE — Progress Notes (Signed)
 [facility] CONSULT NOTE  Patient Care Team: Shepard Ade, MD as PCP - General (Internal Medicine) Ebbie Cough, MD as Consulting Physician (General Surgery) Loretha Ash, MD as Consulting Physician (Hematology and Oncology) Shannon Agent, MD as Consulting Physician (Radiation Oncology)  CHIEF COMPLAINTS/PURPOSE OF CONSULTATION:  DCIS  HISTORY OF PRESENTING ILLNESS:  Theresa Shannon 78 y.o. female is here because of recent diagnosis of right breast DCIS  I reviewed her records extensively and collaborated the history with the patient.  SUMMARY OF ONCOLOGIC HISTORY: Oncology History  Ductal carcinoma in situ (DCIS) of right breast  10/15/2021 Mammogram   Digital screening bilateral mammogram showed indeterminate calcifications in the right breast.   10/31/2021 Pathology Results   Pathology from the breast biopsy showed low-grade DCIS with calcifications rest of the needle core biopsy showed fibrocystic changes with usual ductal hyperplasia and vascular calcifications ER 100% positive strong staining, PR 95% positive strong staining   11/12/2021 Initial Diagnosis   Ductal carcinoma in situ (DCIS) of right breast   11/14/2021 Cancer Staging   Staging form: Breast, AJCC 8th Edition - Clinical stage from 11/14/2021: Stage 0 (cTis (DCIS), cN0, cM0, ER+, PR+, HER2: Not Assessed) - Signed by Loretha Ash, MD on 11/14/2021 Stage prefix: Initial diagnosis Nuclear grade: G1 Laterality: Right Staged by: Pathologist and managing physician Stage used in treatment planning: Yes National guidelines used in treatment planning: Yes Type of national guideline used in treatment planning: NCCN    Genetic Testing   [facility] CustomNext Panel was Negative. Report date is 12/05/2021.  The CustomNext-Cancer+RNAinsight panel offered by [employer] includes sequencing and rearrangement analysis for the following 52 genes :  APC, ATM, AXIN2, BAP1, BARD1, BMPR1A, BRCA1, BRCA2,  BRIP1, CDH1, CDK4, CDKN2A, CHEK2, CTNNA1, DICER1, EPCAM, FH, FLCN, GREM1, HOXB13, KIT, MEN1, MET, MITF, MLH1, MSH2, MSH3, MSH6, MUTYH, NBN, NF1, NTHL1, PALB2, PDGFRA, PMS2, POLD1, POLE, PTEN, RAD50, RAD51C, RAD51D, SDHA, SDHB, SDHC, SDHD, SMAD4, SMARCA4, STK11, TP53, TSC1, TSC2, and VHL.  RNA data is routinely analyzed for use in variant interpretation for all genes.    12/28/2021 Surgery   Right breast lumpectomy initially done on 518 showed DCIS with calcifications partially involving a fibrotic papillary lesion, focally less than 1 mm from final medial margin.  She underwent reexcision and this once again showed low-grade DCIS measuring 1 cm again 1 mm from the final medial margin fibrosis and inflammation consistent with previous lumpectomy. 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 Anyway after she recovered from the SJS, she was switched to exemestane .     Discussed the use of AI scribe software for clinical note transcription with the patient, who gave verbal consent to proceed.  History of Present Illness Theresa Shannon is an 78 year old female with breast cancer who presents for follow-up regarding her medication regimen.  She is currently taking Aromasin  daily for her breast cancer and finds it more effective than her previous medication, allopurinol, which caused joint pains and diarrhea. She experiences no side effects from Aromasin   and adheres to the daily regimen.  A mammogram in May was normal. She has not seen her breast surgeon  since shortly after her surgery, except for one follow-up visit.  She has swelling in her legs, which has been present since her breast surgery. The swelling is more pronounced in one leg compared to the other and has persisted despite the lack of known association with her type of surgery.  She is actively involved in her family life, particularly with her grandchildren and great-grandchildren, whom she sees frequently.  No current side effects from her breast cancer medication, Aromasin , and no new symptoms.  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   Chronic kidney disease    hypertensive chronic kideny disease benign/ stage 3   Diabetes mellitus, type 2 (HCC)    Esophagitis determined by endoscopy 03/2015   GERD (gastroesophageal reflux disease)    Gout 04/2015   L toe   History of frequent urinary tract infections    History of hiatal hernia    History of kidney stones    HTN (hypertension)    Hyperlipemia    Hypertension    Hypothyroidism    Peripheral neuropathy    Peripheral vascular disease (HCC)    Spondylolisthesis of lumbar region    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; right eye    Thyroid goiter    multinodular    Wears glasses     SURGICAL HISTORY: Past Surgical History:  Procedure Laterality Date   ABDOMINAL HYSTERECTOMY     ABSCESS DRAINAGE  07/08/1993   breast   ANTERIOR LAT LUMBAR FUSION N/A 02/02/2018   Procedure: Anterolateral decompression Lumbar Four-Five with xlif percutaneous fixation with robotic placement of screws Lumbar four-five;  Surgeon: Colon Shove, MD;  Location: [facility] OR;  Service: Neurosurgery;  Laterality: N/A;   APPENDECTOMY     APPLICATION OF ROBOTIC ASSISTANCE FOR SPINAL PROCEDURE N/A 02/02/2018   Procedure: APPLICATION OF ROBOTIC ASSISTANCE FOR  SPINAL PROCEDURE;  Surgeon: Colon Shove, MD;  Location: [facility] OR;  Service: Neurosurgery;  Laterality: N/A;   BACK SURGERY     BREAST BIOPSY     BREAST CYST EXCISION  02/26/2012   Procedure: CYST EXCISION BREAST;  Surgeon: Vicenta DELENA Poli, MD;  Location: [facility];  Service: General;  Laterality: Left;  excision chronic cyst left breast   BREAST LUMPECTOMY WITH RADIOACTIVE SEED LOCALIZATION Right 11/22/2021   Procedure: RIGHT BREAST LUMPECTOMY WITH RADIOACTIVE SEED LOCALIZATION;  Surgeon: Ebbie Cough, MD;  Location: [facility] OR;  Service: General;  Laterality: Right;   BUNIONECTOMY     LEFT   CHOLECYSTECTOMY N/A 08/17/2015   Procedure: LAPAROSCOPIC CHOLECYSTECTOMY WITH INTRAOPERATIVE CHOLANGIOGRAM;  Surgeon: Krystal Spinner, MD;  Location: [facility] ORS;  Service: General;  Laterality: N/A;   DILATION AND CURETTAGE OF UTERUS     KNEE SURGERY     BILATERAL   LUMBAR PERCUTANEOUS PEDICLE SCREW 1 LEVEL N/A 02/02/2018   Procedure: LUMBAR PERCUTANEOUS PEDICLE SCREW LUMBAR FOUR-FIVE;  Surgeon: Colon Shove, MD;  Location: [facility] OR;  Service: Neurosurgery;  Laterality: N/A;   RE-EXCISION OF BREAST LUMPECTOMY Right 12/28/2021   Procedure: RE-EXCISION OF RIGHT BREAST MARGIN;  Surgeon: Ebbie Cough, MD;  Location: [facility];  Service: General;  Laterality:  Right;   TEMPORAL ARTERY BIOPSY / LIGATION     TONSILLECTOMY AND ADENOIDECTOMY     TUBAL LIGATION     TUMOR REMOVAL     LEFT ARM-lipoma    SOCIAL HISTORY: Social History   Socioeconomic History   Marital status: Married    Spouse name: Nancyann   Number of children: 1   Years of education: 12th   Highest education level: Not on file  Occupational History   Occupation: Retired  Tobacco Use   Smoking status: Never   Smokeless tobacco: Never  Vaping Use   Vaping status: Never Used  Substance and Sexual Activity   Alcohol use: No   Drug use: No   Sexual activity: Not on file  Other Topics Concern   Not on file  Social History  Narrative   Patient lives at home with husband Nancyann.    Patient has 1 child.    Patient is retired.    Patient has a high school education.    Social Drivers of Corporate investment banker Strain: Low Risk  (11/14/2021)   Overall Financial Resource Strain (CARDIA)    Difficulty of Paying Living Expenses: Not very hard  Food Insecurity: No Food Insecurity (11/14/2021)   Hunger Vital Sign    Worried About Running Out of Food in the Last Year: Never true    Ran Out of Food in the Last Year: Never true  Transportation Needs: No Transportation Needs (11/14/2021)   PRAPARE - Administrator, Civil Service (Medical): No    Lack of Transportation (Non-Medical): No  Physical Activity: Not on file  Stress: Not on file  Social Connections: Not on file  Intimate Partner Violence: Not on file    FAMILY HISTORY: Family History  Problem Relation Age of Onset   Dementia Mother    Kidney disease Mother    Cancer Mother    Diabetes Mother    Breast cancer Mother 22   Dementia Father    Heart disease Father    Hypertension Father    Stroke Father    Breast cancer Sister 2   Kidney cancer Sister 62   Leukemia Maternal Aunt    Pancreatic cancer Paternal Aunt     ALLERGIES:  is allergic to allopurinol, amlodipine, oxycodone-acetaminophen , oxaprozin, tyloxapol, and other.  MEDICATIONS:  Current Outpatient Medications  Medication Sig Dispense Refill   acetaminophen  (TYLENOL ) 325 MG tablet Take 650 mg by mouth every 6 (six) hours as needed for mild pain.     aspirin 81 MG chewable tablet Chew 81 mg by mouth daily.     atenolol  (TENORMIN ) 50 MG tablet Take 1 tablet (50 mg total) by mouth daily. 90 tablet 4   cholecalciferol (VITAMIN D) 1000 units tablet Take 1,000 Units by mouth daily.     exemestane  (AROMASIN ) 25 MG tablet Take 1 tablet (25 mg total) by mouth daily after breakfast. 90 tablet 1   fenofibrate  (TRICOR ) 145 MG tablet Take 1 tablet (145 mg total) by mouth daily.  Please make overdue yearly appt with Dr. Alveta before anymore refills. 2nd attempt 15 tablet 0   JARDIANCE 25 MG TABS tablet Take 25 mg by mouth daily.     levothyroxine  (SYNTHROID ) 150 MCG tablet Take 150 mcg by mouth every morning.     metFORMIN  (GLUCOPHAGE -XR) 500 MG 24 hr tablet Take 500 mg by mouth 2 (two) times daily.     Multiple Vitamin (MULTIVITAMIN WITH MINERALS) TABS tablet Take 1 tablet by  mouth daily.     olmesartan-hydrochlorothiazide  (BENICAR HCT) 40-25 MG tablet Take 1 tablet by mouth daily.  3   No current facility-administered medications for this visit.    REVIEW OF SYSTEMS:   Constitutional: Denies fevers, chills or abnormal night sweats Eyes: Denies blurriness of vision, double vision or watery eyes Ears, nose, mouth, throat, and face: Denies mucositis or sore throat Respiratory: Denies cough, dyspnea or wheezes Cardiovascular: Denies palpitation, chest discomfort or lower extremity swelling Gastrointestinal:  Denies nausea, heartburn or change in bowel habits Skin: Denies abnormal skin rashes Lymphatics: Denies new lymphadenopathy or easy bruising Neurological:Denies numbness, tingling or new weaknesses Behavioral/Psych: Mood is stable, no new changes  Breast: Denies any palpable lumps or discharge All other systems were reviewed with the patient and are negative.  PHYSICAL EXAMINATION: ECOG PERFORMANCE STATUS: 0 - Asymptomatic  Vitals:   02/09/24 1002 02/09/24 1003  BP: (!) 147/52 (!) 155/48  Pulse: (!) 55   Resp: 17   Temp: (!) 100.5 F (38.1 C)   SpO2: 97%       Filed Weights   02/09/24 1002  Weight: 176 lb 8 oz (80.1 kg)    Physical Exam Constitutional:      Appearance: Normal appearance.  Chest:     Comments: Postsurgical changes noted in the right breast.  No palpable masses or regional adenopathy concerning for recurrence. Musculoskeletal:     Cervical back: Normal range of motion and neck supple. No rigidity.  Lymphadenopathy:      Cervical: No cervical adenopathy.  Neurological:     Mental Status: She is alert.      LABORATORY DATA:  I have reviewed the data as listed Lab Results  Component Value Date   WBC 4.9 10/25/2022   HGB 11.2 (L) 10/25/2022   HCT 36.5 10/25/2022   MCV 95.1 10/25/2022   PLT 215 10/25/2022   Lab Results  Component Value Date   NA 134 (L) 10/25/2022   K 4.1 10/25/2022   CL 102 10/25/2022   CO2 21 (L) 10/25/2022      RADIOGRAPHIC STUDIES: I have personally reviewed the radiological reports and agreed with the findings in the report.  ASSESSMENT AND PLAN:   Ductal carcinoma in situ (DCIS) of right breast Assessment and Plan Assessment & Plan Breast cancer on aromatase inhibitor therapy Breast cancer managed with Aromasin  (exemestane ). Joint pain and diarrhea resolved post-allopurinol discontinuation.  Recent mammogram normal, breast tissue not dense. Bone health strong. - Send prescription refill for Aromasin  to [facility]. - Order mammogram for next year. - Plan to see her in one year unless issues arise.  Lower extremity edema Bilateral lower extremity edema since breast surgery, I dont think this is necessarily related to breast surgery Mild asymmetry noted once again chronic. - Advise elevating legs when sitting to help reduce swelling.  Baseline bone density is normal. Encouraged ongoing weight bearing exercises, vit D and calcium supplementation   All questions were answered. The patient knows to call the clinic with any problems, questions or concerns.    Amber Stalls, MD 02/09/24

## 2024-02-09 NOTE — Assessment & Plan Note (Signed)
 Assessment and Plan Assessment & Plan Breast cancer on aromatase inhibitor therapy Breast cancer managed with Aromasin  (exemestane ). Joint pain and diarrhea resolved post-allopurinol discontinuation.  Recent mammogram normal, breast tissue not dense. Bone health strong. - Send prescription refill for Aromasin  to [facility]. - Order mammogram for next year. - Plan to see her in one year unless issues arise.  Lower extremity edema Bilateral lower extremity edema since breast surgery, I dont think this is necessarily related to breast surgery Mild asymmetry noted once again chronic. - Advise elevating legs when sitting to help reduce swelling.  Baseline bone density is normal. Encouraged ongoing weight bearing exercises, vit D and calcium supplementation

## 2024-11-22 ENCOUNTER — Encounter

## 2025-02-09 ENCOUNTER — Ambulatory Visit: Admitting: Hematology and Oncology
